# Patient Record
Sex: Male | Born: 1935 | Race: White | Hispanic: No | Smoking: Former smoker
Health system: Southern US, Community
[De-identification: ages and names within clinical notes are randomized; demographics above are authoritative.]

## PROBLEM LIST (undated history)

## (undated) DIAGNOSIS — I200 Unstable angina: Secondary | ICD-10-CM

## (undated) DIAGNOSIS — E785 Hyperlipidemia, unspecified: Secondary | ICD-10-CM

## (undated) DIAGNOSIS — K2970 Gastritis, unspecified, without bleeding: Secondary | ICD-10-CM

## (undated) DIAGNOSIS — M1990 Unspecified osteoarthritis, unspecified site: Secondary | ICD-10-CM

## (undated) DIAGNOSIS — Z8551 Personal history of malignant neoplasm of bladder: Secondary | ICD-10-CM

## (undated) DIAGNOSIS — L409 Psoriasis, unspecified: Secondary | ICD-10-CM

## (undated) DIAGNOSIS — M75110 Incomplete rotator cuff tear or rupture of unspecified shoulder, not specified as traumatic: Secondary | ICD-10-CM

## (undated) DIAGNOSIS — Z87442 Personal history of urinary calculi: Secondary | ICD-10-CM

## (undated) DIAGNOSIS — K219 Gastro-esophageal reflux disease without esophagitis: Secondary | ICD-10-CM

## (undated) DIAGNOSIS — I10 Essential (primary) hypertension: Secondary | ICD-10-CM

## (undated) DIAGNOSIS — I452 Bifascicular block: Secondary | ICD-10-CM

## (undated) DIAGNOSIS — I2510 Atherosclerotic heart disease of native coronary artery without angina pectoris: Secondary | ICD-10-CM

## (undated) HISTORY — PX: NECK SURGERY: SHX720

## (undated) HISTORY — DX: Unstable angina: I20.0

## (undated) HISTORY — DX: Incomplete rotator cuff tear or rupture of unspecified shoulder, not specified as traumatic: M75.110

## (undated) HISTORY — PX: BACK SURGERY: SHX140

## (undated) HISTORY — DX: Bifascicular block: I45.2

## (undated) HISTORY — DX: Essential (primary) hypertension: I10

## (undated) HISTORY — DX: Gastro-esophageal reflux disease without esophagitis: K21.9

## (undated) HISTORY — PX: TONSILLECTOMY: SUR1361

## (undated) HISTORY — DX: Gastritis, unspecified, without bleeding: K29.70

## (undated) HISTORY — PX: SKIN CANCER EXCISION: SHX779

## (undated) HISTORY — DX: Personal history of urinary calculi: Z87.442

## (undated) HISTORY — PX: CYSTOSTOMY W/ BLADDER BIOPSY: SHX1431

## (undated) HISTORY — DX: Hyperlipidemia, unspecified: E78.5

## (undated) HISTORY — DX: Atherosclerotic heart disease of native coronary artery without angina pectoris: I25.10

---

## 1999-07-07 HISTORY — PX: CORONARY ARTERY BYPASS GRAFT: SHX141

## 2000-01-04 HISTORY — PX: CARDIAC CATHETERIZATION: SHX172

## 2000-01-04 HISTORY — PX: PTCA: SHX146

## 2000-01-16 ENCOUNTER — Inpatient Hospital Stay (HOSPITAL_COMMUNITY): Admission: EM | Admit: 2000-01-16 | Discharge: 2000-01-18 | Payer: Self-pay | Admitting: Emergency Medicine

## 2000-01-16 ENCOUNTER — Encounter: Payer: Self-pay | Admitting: Emergency Medicine

## 2000-03-06 HISTORY — PX: OTHER SURGICAL HISTORY: SHX169

## 2000-03-20 ENCOUNTER — Encounter: Payer: Self-pay | Admitting: Emergency Medicine

## 2000-03-20 ENCOUNTER — Inpatient Hospital Stay (HOSPITAL_COMMUNITY): Admission: EM | Admit: 2000-03-20 | Discharge: 2000-03-28 | Payer: Self-pay | Admitting: Emergency Medicine

## 2000-03-23 ENCOUNTER — Encounter: Payer: Self-pay | Admitting: Cardiothoracic Surgery

## 2000-03-24 ENCOUNTER — Encounter: Payer: Self-pay | Admitting: Cardiothoracic Surgery

## 2000-03-25 ENCOUNTER — Encounter: Payer: Self-pay | Admitting: Cardiothoracic Surgery

## 2000-03-26 ENCOUNTER — Encounter: Payer: Self-pay | Admitting: Cardiothoracic Surgery

## 2001-09-03 HISTORY — PX: CT ABD W & PELVIS WO CM: HXRAD294

## 2001-09-11 ENCOUNTER — Encounter: Payer: Self-pay | Admitting: Emergency Medicine

## 2001-09-11 ENCOUNTER — Emergency Department (HOSPITAL_COMMUNITY): Admission: EM | Admit: 2001-09-11 | Discharge: 2001-09-11 | Payer: Self-pay | Admitting: Emergency Medicine

## 2001-09-26 ENCOUNTER — Ambulatory Visit (HOSPITAL_COMMUNITY): Admission: RE | Admit: 2001-09-26 | Discharge: 2001-09-26 | Payer: Self-pay | Admitting: Cardiology

## 2001-09-26 ENCOUNTER — Encounter: Payer: Self-pay | Admitting: Cardiology

## 2001-10-12 ENCOUNTER — Encounter: Payer: Self-pay | Admitting: Otolaryngology

## 2001-10-12 ENCOUNTER — Encounter: Admission: RE | Admit: 2001-10-12 | Discharge: 2001-10-12 | Payer: Self-pay | Admitting: Otolaryngology

## 2002-04-05 HISTORY — PX: NASAL SINUS SURGERY: SHX719

## 2002-04-10 ENCOUNTER — Encounter: Admission: RE | Admit: 2002-04-10 | Discharge: 2002-04-10 | Payer: Self-pay | Admitting: Otolaryngology

## 2002-04-10 ENCOUNTER — Encounter: Payer: Self-pay | Admitting: Otolaryngology

## 2002-04-11 ENCOUNTER — Ambulatory Visit (HOSPITAL_BASED_OUTPATIENT_CLINIC_OR_DEPARTMENT_OTHER): Admission: RE | Admit: 2002-04-11 | Discharge: 2002-04-11 | Payer: Self-pay | Admitting: Otolaryngology

## 2002-04-11 ENCOUNTER — Encounter (INDEPENDENT_AMBULATORY_CARE_PROVIDER_SITE_OTHER): Payer: Self-pay | Admitting: *Deleted

## 2003-10-03 ENCOUNTER — Encounter: Admission: RE | Admit: 2003-10-03 | Discharge: 2003-10-03 | Payer: Self-pay | Admitting: Family Medicine

## 2003-10-05 HISTORY — PX: OTHER SURGICAL HISTORY: SHX169

## 2003-11-05 ENCOUNTER — Encounter: Admission: RE | Admit: 2003-11-05 | Discharge: 2003-11-05 | Payer: Self-pay | Admitting: Cardiology

## 2004-09-18 ENCOUNTER — Ambulatory Visit: Payer: Self-pay | Admitting: Family Medicine

## 2004-10-02 ENCOUNTER — Ambulatory Visit: Payer: Self-pay | Admitting: Family Medicine

## 2004-11-13 ENCOUNTER — Ambulatory Visit: Payer: Self-pay | Admitting: Family Medicine

## 2004-11-19 ENCOUNTER — Ambulatory Visit: Payer: Self-pay | Admitting: Cardiology

## 2005-04-15 ENCOUNTER — Ambulatory Visit: Payer: Self-pay | Admitting: Family Medicine

## 2005-04-22 ENCOUNTER — Ambulatory Visit: Payer: Self-pay | Admitting: Family Medicine

## 2005-09-29 ENCOUNTER — Ambulatory Visit: Payer: Self-pay | Admitting: Family Medicine

## 2005-11-09 ENCOUNTER — Ambulatory Visit: Payer: Self-pay | Admitting: Cardiology

## 2005-12-02 ENCOUNTER — Ambulatory Visit: Payer: Self-pay | Admitting: Family Medicine

## 2005-12-18 ENCOUNTER — Ambulatory Visit: Payer: Self-pay | Admitting: Family Medicine

## 2006-04-06 ENCOUNTER — Ambulatory Visit: Payer: Self-pay | Admitting: Family Medicine

## 2006-09-30 ENCOUNTER — Ambulatory Visit: Payer: Self-pay | Admitting: Family Medicine

## 2006-09-30 LAB — CONVERTED CEMR LAB
BUN: 16 mg/dL (ref 6–23)
Basophils Relative: 0.3 % (ref 0.0–1.0)
CO2: 30 meq/L (ref 19–32)
Cholesterol: 148 mg/dL (ref 0–200)
Creatinine, Ser: 0.9 mg/dL (ref 0.4–1.5)
Eosinophils Relative: 4.2 % (ref 0.0–5.0)
GFR calc Af Amer: 108 mL/min
LDL Cholesterol: 91 mg/dL (ref 0–99)
Monocytes Absolute: 0.6 10*3/uL (ref 0.2–0.7)
Monocytes Relative: 9.7 % (ref 3.0–11.0)
Platelets: 198 10*3/uL (ref 150–400)
Potassium: 3.8 meq/L (ref 3.5–5.1)
RBC: 5.19 M/uL (ref 4.22–5.81)
Sodium: 140 meq/L (ref 135–145)
TSH: 1.05 microintl units/mL (ref 0.35–5.50)
Total CHOL/HDL Ratio: 3.7
Triglycerides: 83 mg/dL (ref 0–149)

## 2006-11-09 ENCOUNTER — Ambulatory Visit: Payer: Self-pay | Admitting: Cardiology

## 2007-04-13 DIAGNOSIS — L570 Actinic keratosis: Secondary | ICD-10-CM | POA: Insufficient documentation

## 2007-04-13 DIAGNOSIS — J310 Chronic rhinitis: Secondary | ICD-10-CM | POA: Insufficient documentation

## 2007-04-13 DIAGNOSIS — H9190 Unspecified hearing loss, unspecified ear: Secondary | ICD-10-CM | POA: Insufficient documentation

## 2007-04-13 DIAGNOSIS — Z87442 Personal history of urinary calculi: Secondary | ICD-10-CM | POA: Insufficient documentation

## 2007-04-13 DIAGNOSIS — K219 Gastro-esophageal reflux disease without esophagitis: Secondary | ICD-10-CM | POA: Insufficient documentation

## 2007-04-13 DIAGNOSIS — E785 Hyperlipidemia, unspecified: Secondary | ICD-10-CM | POA: Insufficient documentation

## 2007-04-14 ENCOUNTER — Ambulatory Visit: Payer: Self-pay | Admitting: Family Medicine

## 2007-04-15 ENCOUNTER — Telehealth: Payer: Self-pay | Admitting: Family Medicine

## 2007-04-15 LAB — CONVERTED CEMR LAB
ALT: 24 units/L (ref 0–53)
HDL: 36.4 mg/dL — ABNORMAL LOW (ref >39.0)
LDL Cholesterol: 86 mg/dL (ref 0–99)
VLDL: 28 mg/dL (ref 0–40)

## 2007-04-18 ENCOUNTER — Encounter: Payer: Self-pay | Admitting: Family Medicine

## 2007-10-03 ENCOUNTER — Ambulatory Visit: Payer: Self-pay | Admitting: Family Medicine

## 2007-10-03 DIAGNOSIS — I2510 Atherosclerotic heart disease of native coronary artery without angina pectoris: Secondary | ICD-10-CM

## 2007-10-05 HISTORY — PX: KIDNEY STONE SURGERY: SHX686

## 2007-10-05 LAB — CONVERTED CEMR LAB
Bilirubin, Direct: 0.3 mg/dL (ref 0.0–0.3)
Calcium: 9.6 mg/dL (ref 8.4–10.5)
Cholesterol: 172 mg/dL (ref 0–200)
Creatinine, Ser: 1 mg/dL (ref 0.4–1.5)
GFR calc Af Amer: 95 mL/min
GFR calc non Af Amer: 79 mL/min
Glucose, Bld: 114 mg/dL — ABNORMAL HIGH (ref 70–99)
HDL: 39.9 mg/dL (ref >39.0)
MCHC: 32.9 g/dL (ref 30.0–36.0)
MCV: 94.4 fL (ref 78.0–100.0)
Monocytes Absolute: 0.8 10*3/uL (ref 0.1–1.0)
Monocytes Relative: 9.4 % (ref 3.0–12.0)
Neutrophils Relative %: 57.8 % (ref 43.0–77.0)
RBC: 5.9 M/uL — ABNORMAL HIGH (ref 4.22–5.81)
RDW: 12.1 % (ref 11.5–14.6)
Sodium: 142 meq/L (ref 135–145)
Total Bilirubin: 2.3 mg/dL — ABNORMAL HIGH (ref 0.3–1.2)
Total CHOL/HDL Ratio: 4.3
Triglycerides: 134 mg/dL (ref 0–149)

## 2007-10-14 ENCOUNTER — Emergency Department (HOSPITAL_COMMUNITY): Admission: EM | Admit: 2007-10-14 | Discharge: 2007-10-14 | Payer: Self-pay | Admitting: Emergency Medicine

## 2007-10-14 ENCOUNTER — Encounter: Payer: Self-pay | Admitting: Family Medicine

## 2007-10-14 HISTORY — PX: CT ABD WO/W & PELVIS WO CM: HXRAD295

## 2007-10-14 HISTORY — PX: OTHER SURGICAL HISTORY: SHX169

## 2007-10-17 ENCOUNTER — Ambulatory Visit: Payer: Self-pay | Admitting: Family Medicine

## 2007-10-17 DIAGNOSIS — N209 Urinary calculus, unspecified: Secondary | ICD-10-CM

## 2007-10-19 ENCOUNTER — Telehealth: Payer: Self-pay | Admitting: Family Medicine

## 2007-10-19 ENCOUNTER — Encounter: Payer: Self-pay | Admitting: Family Medicine

## 2007-10-19 ENCOUNTER — Inpatient Hospital Stay (HOSPITAL_COMMUNITY): Admission: AD | Admit: 2007-10-19 | Discharge: 2007-10-22 | Payer: Self-pay | Admitting: Urology

## 2007-10-22 ENCOUNTER — Encounter: Payer: Self-pay | Admitting: Family Medicine

## 2007-10-26 ENCOUNTER — Encounter: Payer: Self-pay | Admitting: Family Medicine

## 2007-10-31 ENCOUNTER — Ambulatory Visit: Payer: Self-pay | Admitting: Family Medicine

## 2007-11-01 LAB — CONVERTED CEMR LAB
Basophils Relative: 0.6 % (ref 0.0–1.0)
Eosinophils Absolute: 0.3 10*3/uL (ref 0.0–0.7)
Eosinophils Relative: 4.1 % (ref 0.0–5.0)
Hemoglobin: 14.9 g/dL (ref 13.0–17.0)
Lymphocytes Relative: 28.3 % (ref 12.0–46.0)
MCHC: 33.8 g/dL (ref 30.0–36.0)
Monocytes Absolute: 0.8 10*3/uL (ref 0.1–1.0)
Monocytes Relative: 11.2 % (ref 3.0–12.0)
Neutro Abs: 3.8 10*3/uL (ref 1.4–7.7)
Neutrophils Relative %: 55.8 % (ref 43.0–77.0)
Platelets: 365 10*3/uL (ref 150–400)
WBC: 6.8 10*3/uL (ref 4.5–10.5)

## 2007-11-07 ENCOUNTER — Ambulatory Visit: Payer: Self-pay | Admitting: Cardiology

## 2008-04-02 ENCOUNTER — Ambulatory Visit: Payer: Self-pay | Admitting: Family Medicine

## 2008-04-04 ENCOUNTER — Ambulatory Visit: Payer: Self-pay | Admitting: Family Medicine

## 2008-04-05 LAB — CONVERTED CEMR LAB
AST: 21 units/L (ref 0–37)
Albumin: 3.7 g/dL (ref 3.5–5.2)
Basophils Relative: 0.4 % (ref 0.0–3.0)
Bilirubin, Direct: 0.2 mg/dL (ref 0.0–0.3)
CO2: 31 meq/L (ref 19–32)
Calcium: 8.9 mg/dL (ref 8.4–10.5)
Eosinophils Relative: 5.7 % — ABNORMAL HIGH (ref 0.0–5.0)
GFR calc Af Amer: 107 mL/min
GFR calc non Af Amer: 89 mL/min
Glucose, Bld: 99 mg/dL (ref 70–99)
HDL: 35.8 mg/dL — ABNORMAL LOW (ref >39.0)
LDL Cholesterol: 89 mg/dL (ref 0–99)
Lymphocytes Relative: 29.4 % (ref 12.0–46.0)
Monocytes Absolute: 0.9 10*3/uL (ref 0.1–1.0)
Neutrophils Relative %: 52.8 % (ref 43.0–77.0)
Potassium: 3.7 meq/L (ref 3.5–5.1)
RDW: 11.7 % (ref 11.5–14.6)
Sodium: 140 meq/L (ref 135–145)
Total CHOL/HDL Ratio: 4.1
Total Protein: 6.2 g/dL (ref 6.0–8.3)
Triglycerides: 117 mg/dL (ref 0–149)
VLDL: 23 mg/dL (ref 0–40)

## 2008-04-09 ENCOUNTER — Telehealth: Payer: Self-pay | Admitting: Family Medicine

## 2008-07-05 ENCOUNTER — Ambulatory Visit: Payer: Self-pay | Admitting: Family Medicine

## 2008-07-05 DIAGNOSIS — T148XXA Other injury of unspecified body region, initial encounter: Secondary | ICD-10-CM | POA: Insufficient documentation

## 2008-07-10 ENCOUNTER — Ambulatory Visit: Payer: Self-pay | Admitting: Family Medicine

## 2008-07-10 DIAGNOSIS — R1011 Right upper quadrant pain: Secondary | ICD-10-CM

## 2008-07-11 ENCOUNTER — Encounter: Admission: RE | Admit: 2008-07-11 | Discharge: 2008-07-11 | Payer: Self-pay | Admitting: Family Medicine

## 2008-08-02 ENCOUNTER — Ambulatory Visit: Payer: Self-pay | Admitting: Gastroenterology

## 2008-08-02 DIAGNOSIS — R143 Flatulence: Secondary | ICD-10-CM

## 2008-08-02 DIAGNOSIS — R142 Eructation: Secondary | ICD-10-CM

## 2008-08-02 DIAGNOSIS — R141 Gas pain: Secondary | ICD-10-CM

## 2008-08-06 HISTORY — PX: ESOPHAGOGASTRODUODENOSCOPY: SHX1529

## 2008-08-08 ENCOUNTER — Ambulatory Visit: Payer: Self-pay | Admitting: Gastroenterology

## 2008-08-10 ENCOUNTER — Encounter: Payer: Self-pay | Admitting: Gastroenterology

## 2008-11-03 HISTORY — PX: CARDIAC CATHETERIZATION: SHX172

## 2008-11-07 DIAGNOSIS — I25810 Atherosclerosis of coronary artery bypass graft(s) without angina pectoris: Secondary | ICD-10-CM

## 2008-11-08 ENCOUNTER — Ambulatory Visit: Payer: Self-pay | Admitting: Cardiology

## 2008-11-08 LAB — CONVERTED CEMR LAB
Basophils Absolute: 0 10*3/uL (ref 0.0–0.1)
Chloride: 106 meq/L (ref 96–112)
Glucose, Bld: 97 mg/dL (ref 70–99)
INR: 1 (ref 0.8–1.0)
Lymphs Abs: 1.6 10*3/uL (ref 0.7–4.0)
MCV: 94.1 fL (ref 78.0–100.0)
Monocytes Relative: 10.1 % (ref 3.0–12.0)
Neutrophils Relative %: 55.8 % (ref 43.0–77.0)
Platelets: 187 10*3/uL (ref 150.0–400.0)
Potassium: 4 meq/L (ref 3.5–5.1)
Prothrombin Time: 11.2 s (ref 10.9–13.3)
RDW: 12.1 % (ref 11.5–14.6)
Sodium: 140 meq/L (ref 135–145)
WBC: 5.9 10*3/uL (ref 4.5–10.5)
aPTT: 30.8 s — ABNORMAL HIGH (ref 21.7–28.8)

## 2008-11-15 ENCOUNTER — Ambulatory Visit: Payer: Self-pay | Admitting: Cardiology

## 2008-11-15 ENCOUNTER — Inpatient Hospital Stay (HOSPITAL_BASED_OUTPATIENT_CLINIC_OR_DEPARTMENT_OTHER): Admission: RE | Admit: 2008-11-15 | Discharge: 2008-11-15 | Payer: Self-pay | Admitting: Cardiology

## 2008-12-04 ENCOUNTER — Ambulatory Visit: Payer: Self-pay | Admitting: Cardiology

## 2008-12-15 IMAGING — CT CT PELVIS W/O CM
2 of 4 series · 13 of 32 positions shown, 19 images · non-contrast
Comparison: None

CT ABDOMEN

CLINICAL DATA: The right sided abdominal pain, right flank pain

CT ABDOMEN AND PELVIS WITHOUT CONTRAST
TECHNIQUE: Multidetector CT imaging of the abdomen and pelvis was
performed following the standard
protocol without intravenous contrast.

[Series 2: <(id) w/o a/p >(id) · axial · non-contrast · 0.75mm/px · z∈[-499,-124]mm · 9 of 94 slices shown, 15 images]
[im 10/94  soft-tissue]
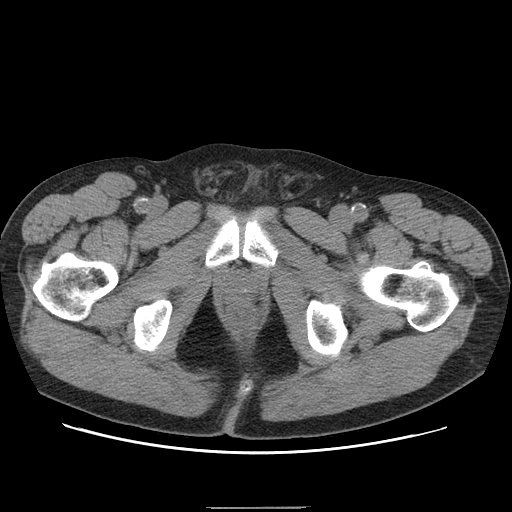
[im 10/94  bone]
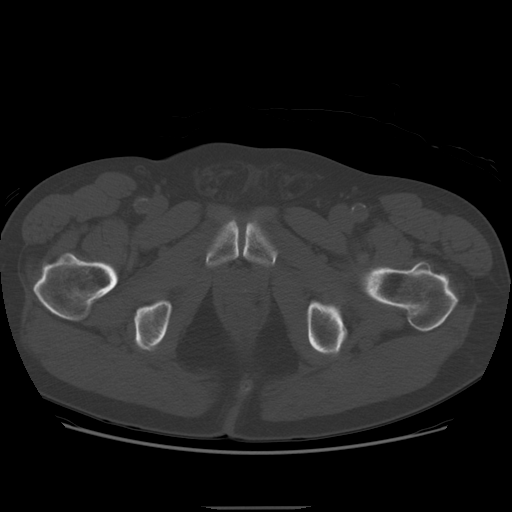
[im 19/94  soft-tissue]
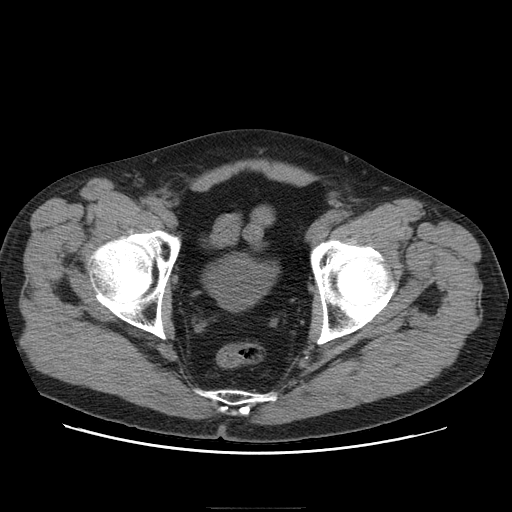
[im 28/94  soft-tissue]
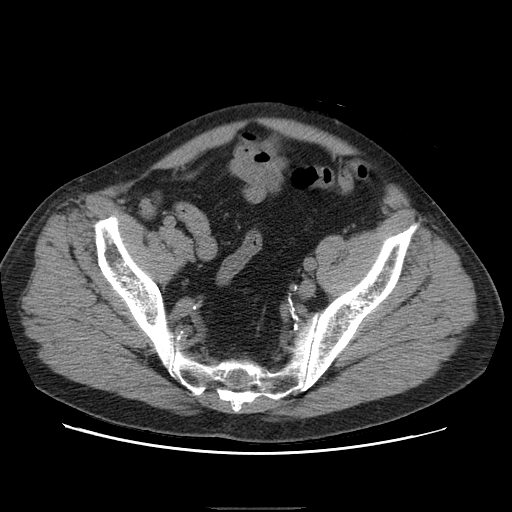
[im 38/94  soft-tissue]
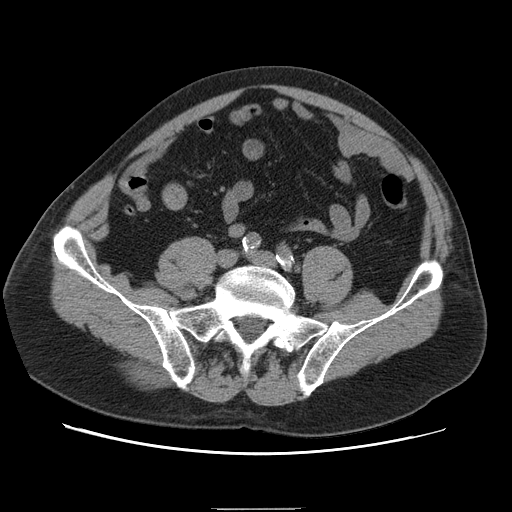
[im 47/94  soft-tissue]
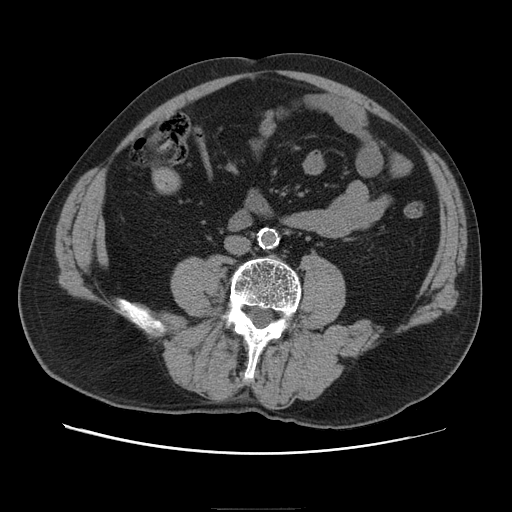
[im 56/94  soft-tissue]
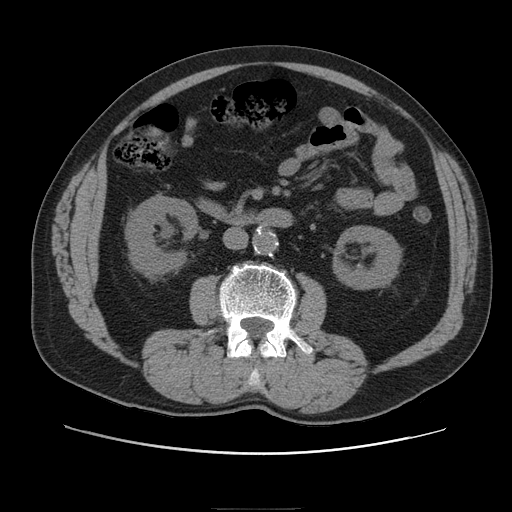
[im 56/94  lung]
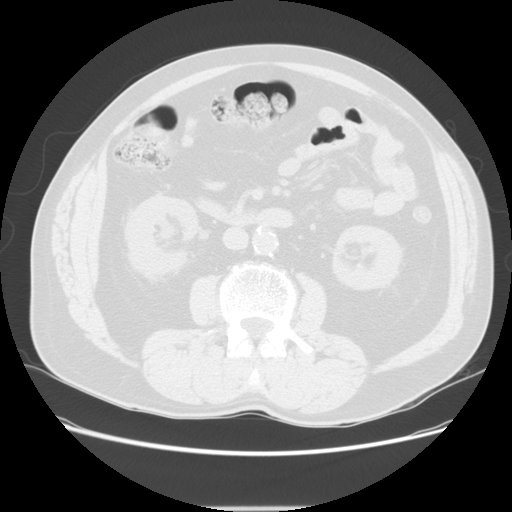
[im 66/94  soft-tissue]
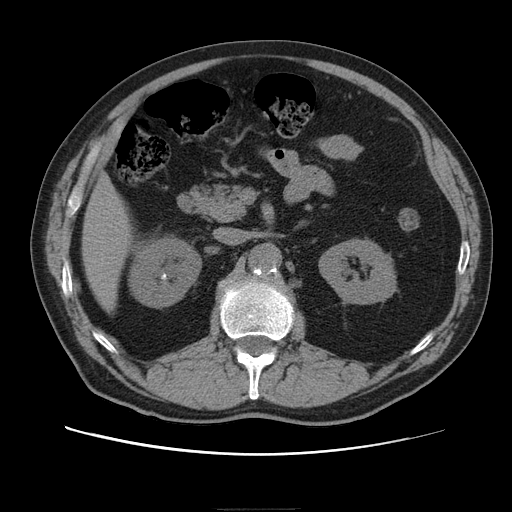
[im 66/94  lung]
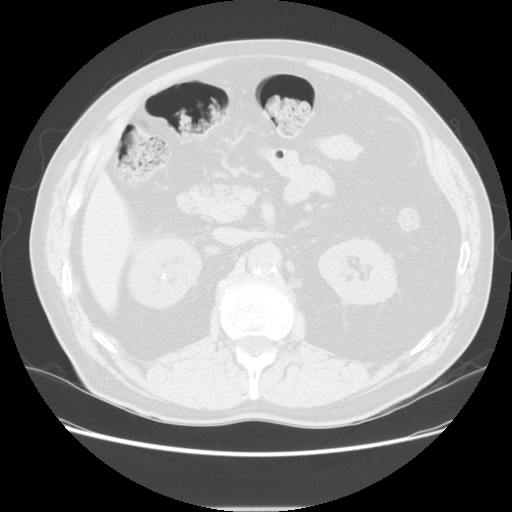
[im 75/94  soft-tissue]
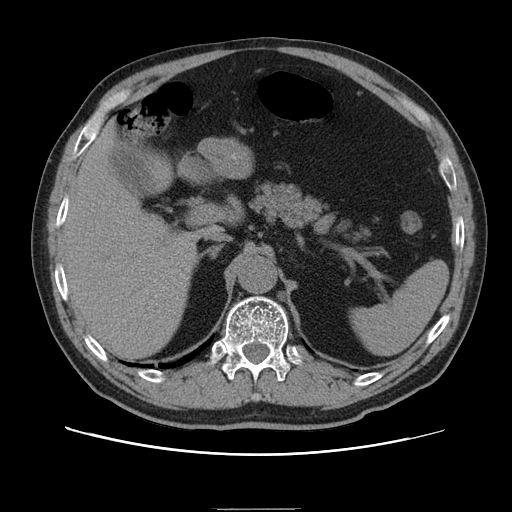
[im 75/94  lung]
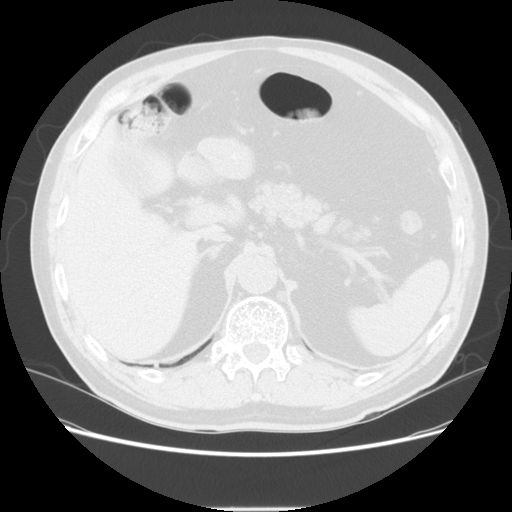
[im 84/94  soft-tissue]
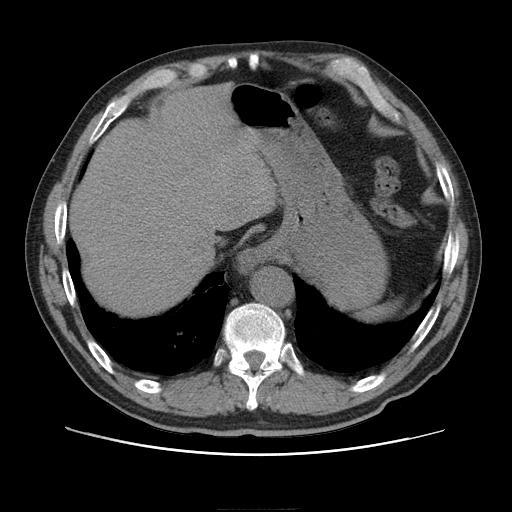
[im 84/94  lung]
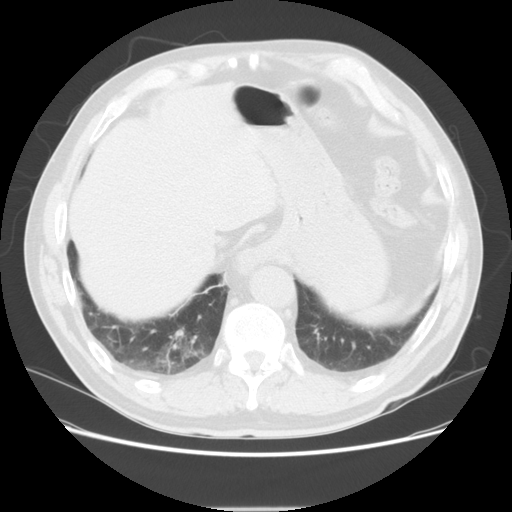
[im 84/94  bone]
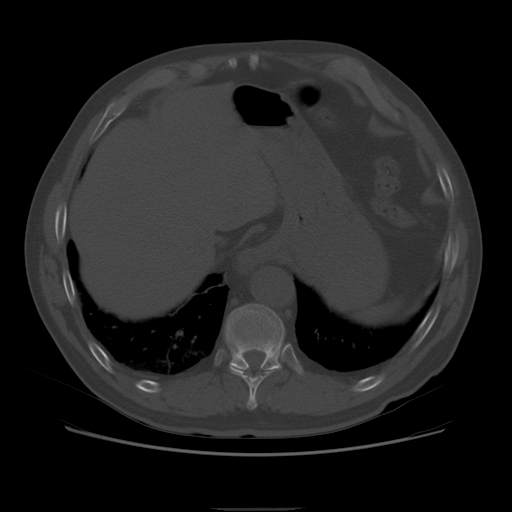

[Series 400: reformatted · sagittal · 0.94mm/px · 4 of 82 slices shown]
[im 10/82  soft-tissue]
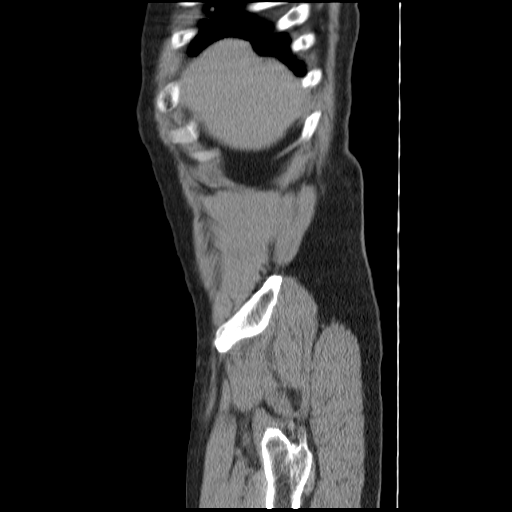
[im 19/82  soft-tissue]
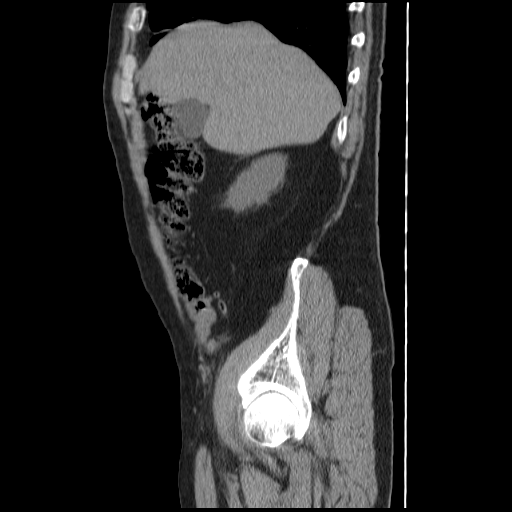
[im 28/82  soft-tissue]
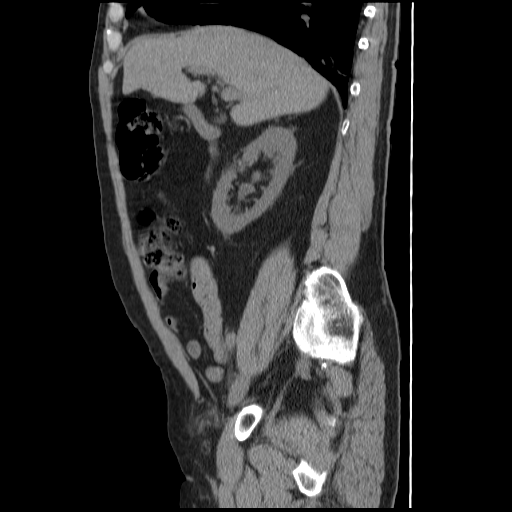
[im 37/82  soft-tissue]
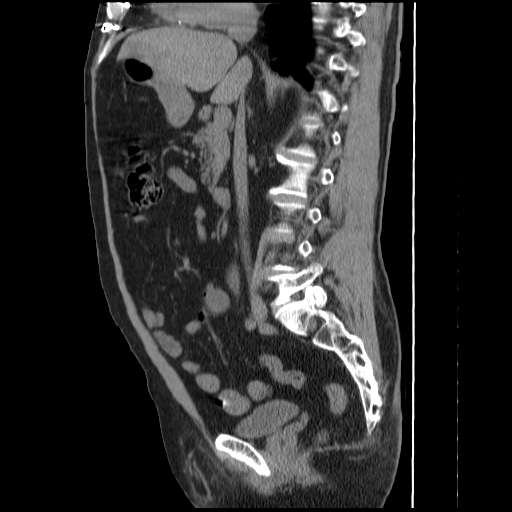

[13 of 32 positions shown; findings below may reference images not displayed]

FINDINGS: There is mild cardiomegaly present.  A small hiatal
hernia is noted.  The liver appears normal in the unenhanced state.
No calcified gallstones are seen.  The pancreas is normal in size
and the pancreatic duct is not dilated.  The adrenal glands and
spleen appear normal.  There are bilateral renal calculi present.
Mild right hydronephrosis is noted and there is a partially
obstructing 4 mm proximal right ureteral calculus present.
Atheromatous changes are noted in the abdominal aorta.
IMPRESSION: 1.  Low grade obstruction on the right by 4 mm proximal right
ureteral calculus.
2.  Small bilateral renal calculi.
3.  Small hiatal hernia.

CT PELVIS
FINDINGS: The distal ureters are normal in caliber.  No distal
ureteral calculi are seen.  The urinary bladder is unremarkable.
The appendix is well seen and appears normal.  No bony abnormality
is noted.
IMPRESSION: Negative CT the pelvis.  No distal ureteral calculi are noted.

## 2008-12-20 IMAGING — CR DG CHEST 2V
2 series · 2 of 2 positions shown · non-contrast
Comparison: PA and lateral chest 10/03/2003.

CLINICAL DATA: Preoperative films for patient with kidney stones.

CHEST - 2 VIEW

[w chest pa]
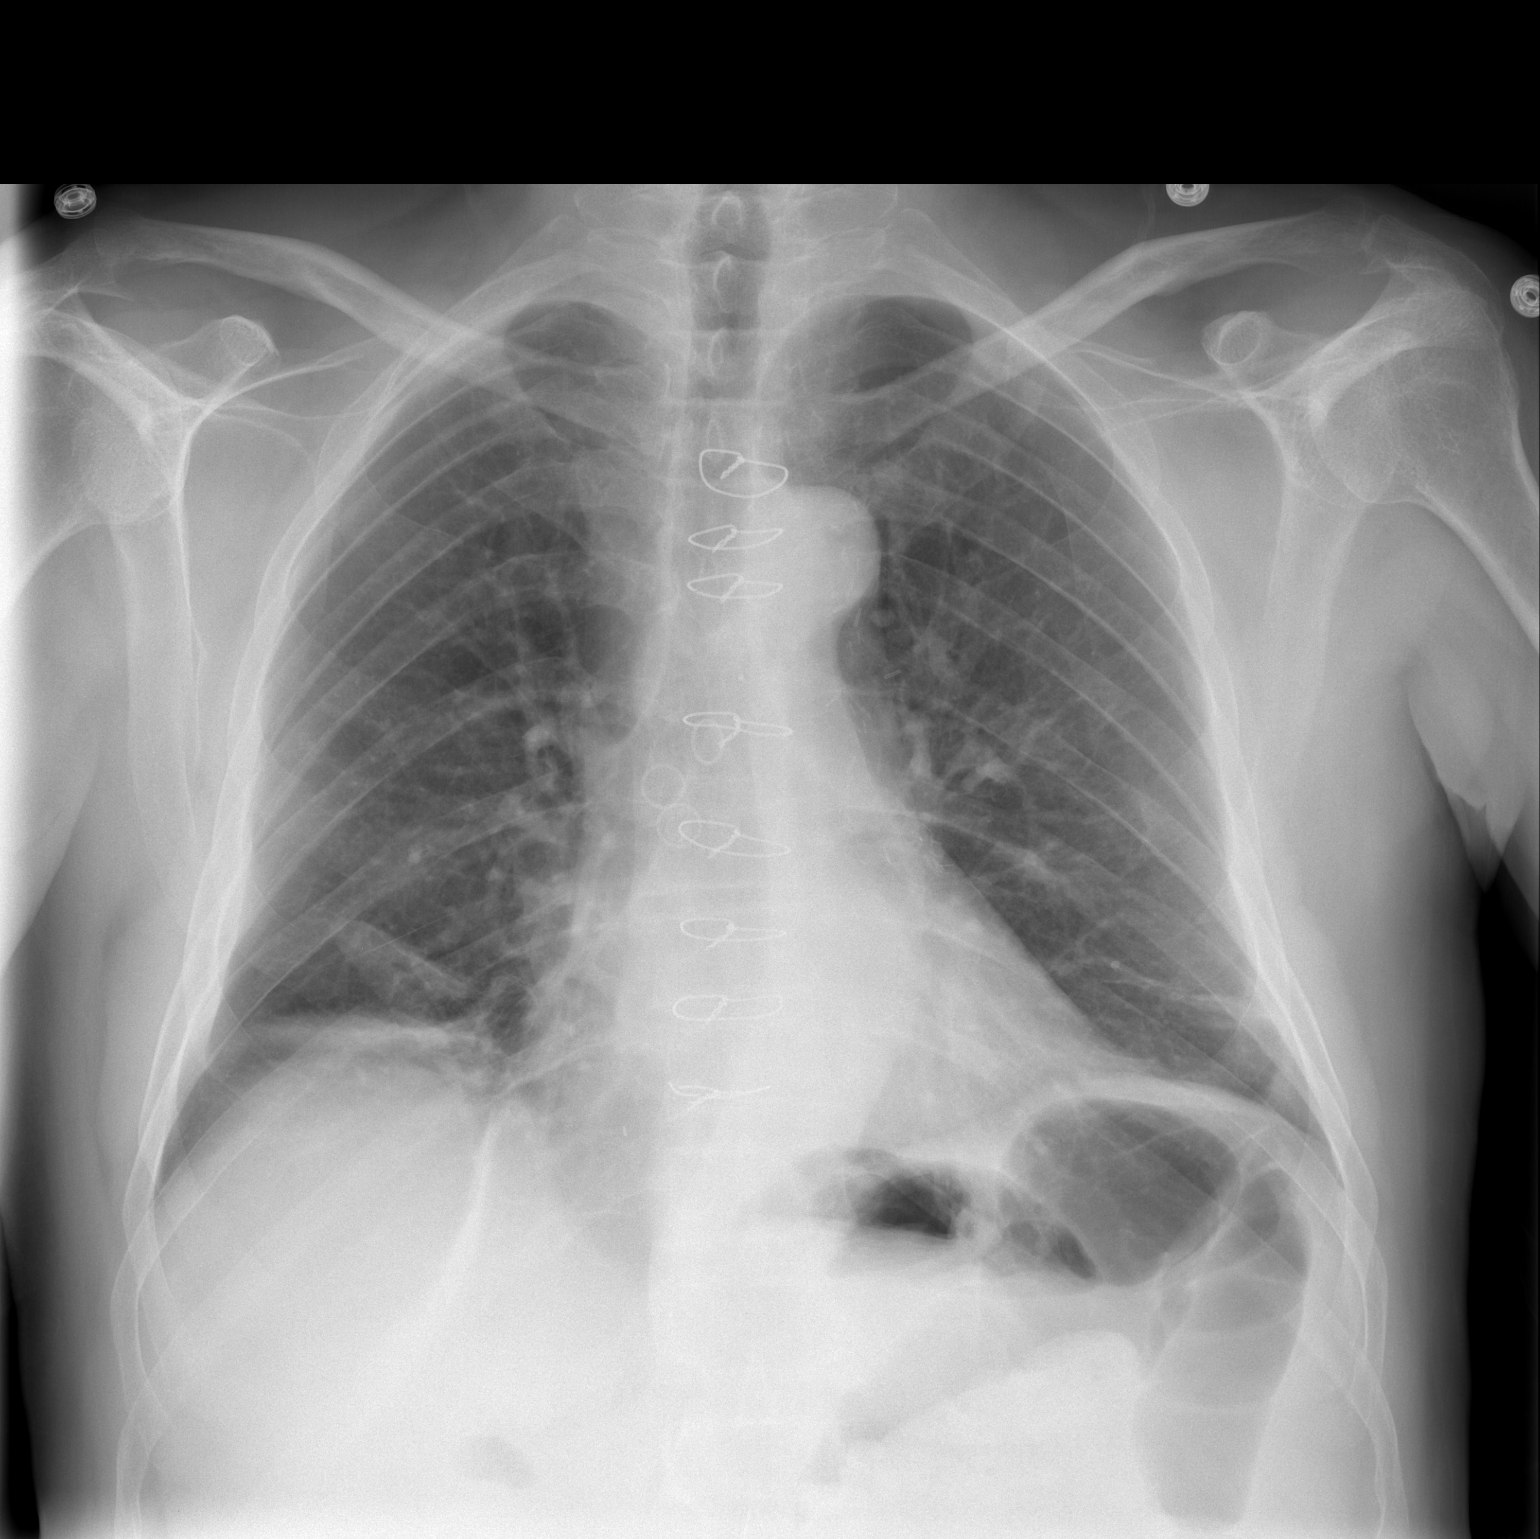

[w chest lat]
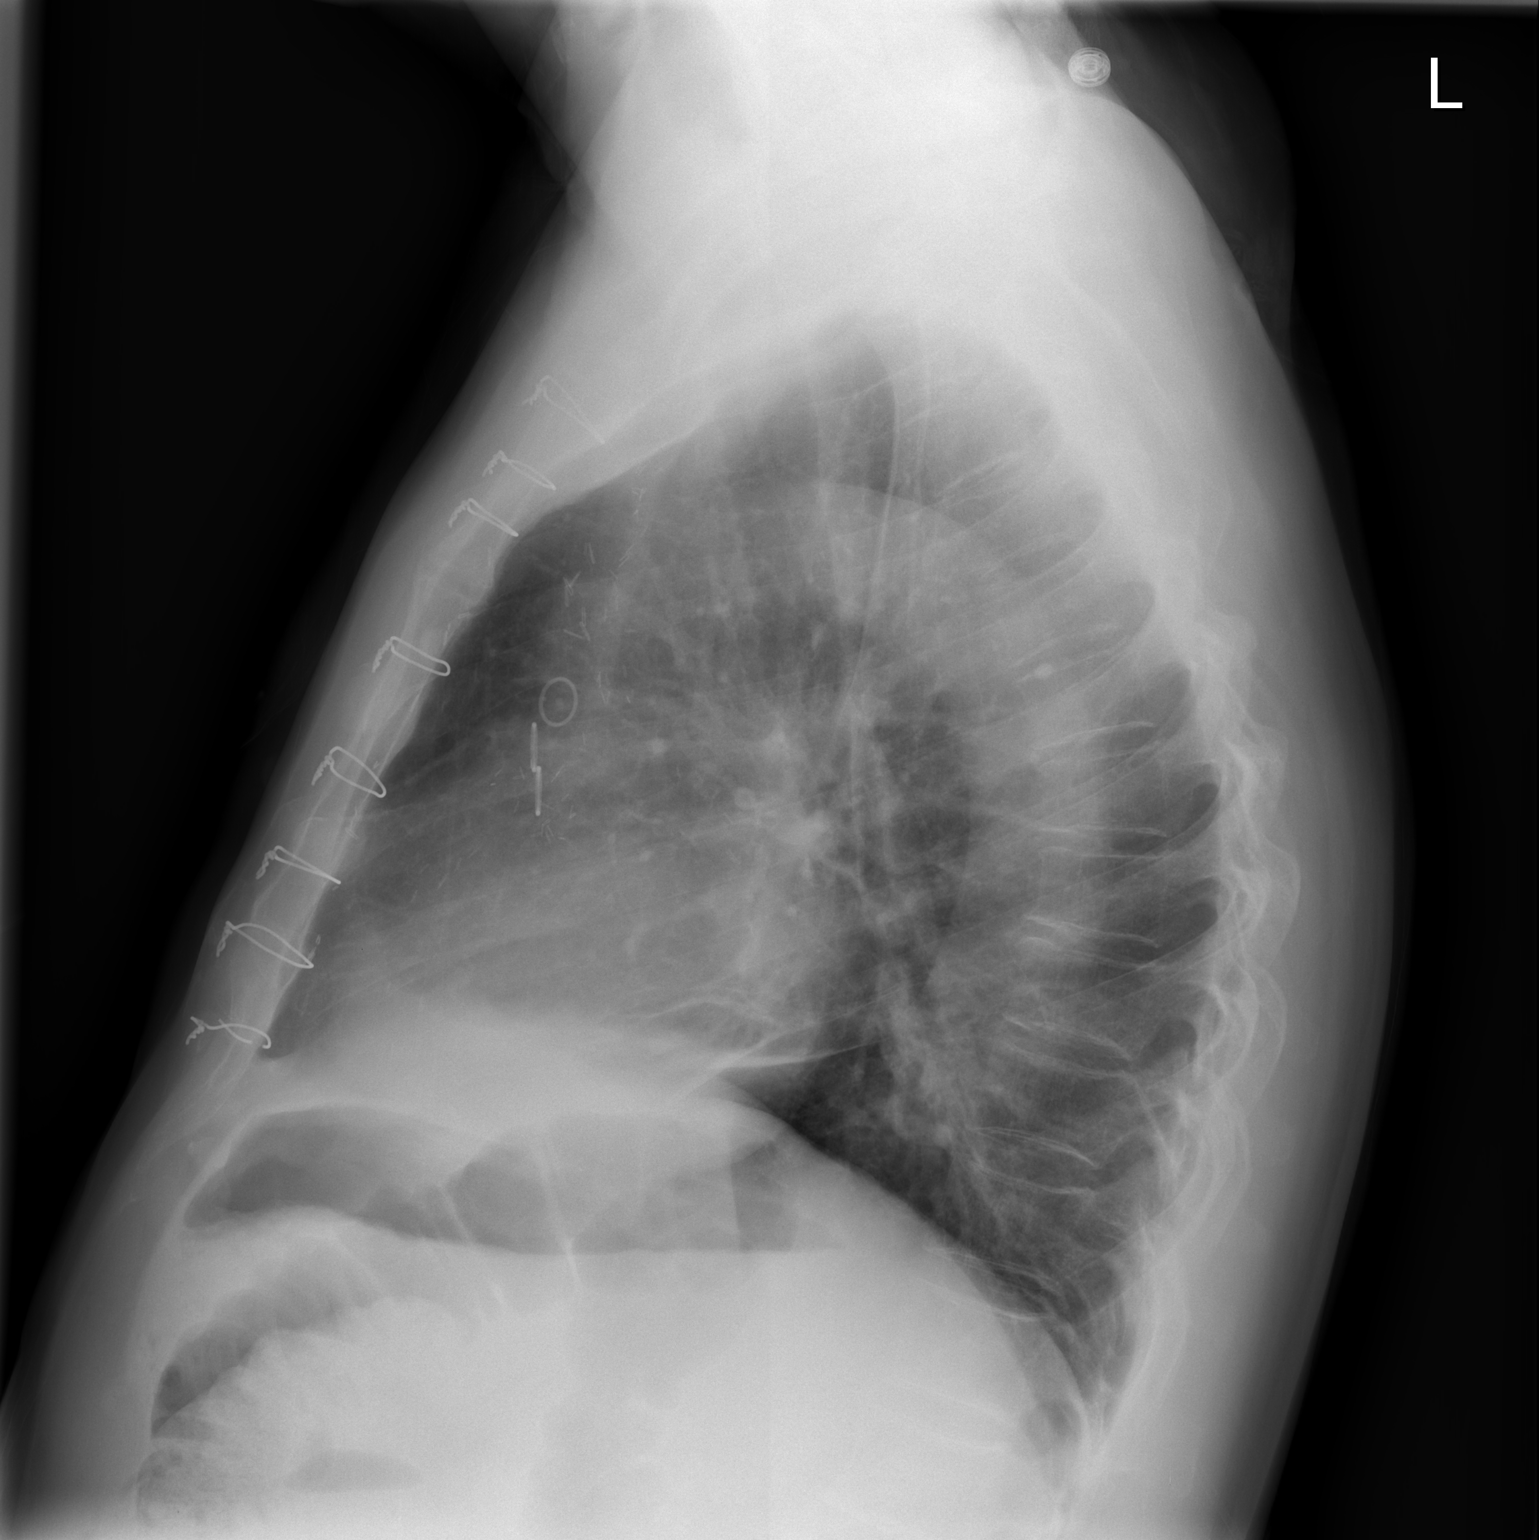

[2 of 2 positions shown; findings below may reference images not displayed]

FINDINGS: Discoid atelectasis in the lung bases, greater on the
right, is noted.  Lungs are otherwise clear.  No pleural effusion.
Heart size normal with postoperative change of CABG noted.
IMPRESSION: Bibasilar atelectasis.  Otherwise, negative.

## 2008-12-21 IMAGING — CR DG ABDOMEN 1V
1 series · 1 of 1 positions shown · non-contrast
Comparison: No plain films, but correlation with CT scan dated
10/14/2007.

CLINICAL DATA: Preop for kidney stone

ABDOMEN - 1 VIEW

[t abdomen supine]
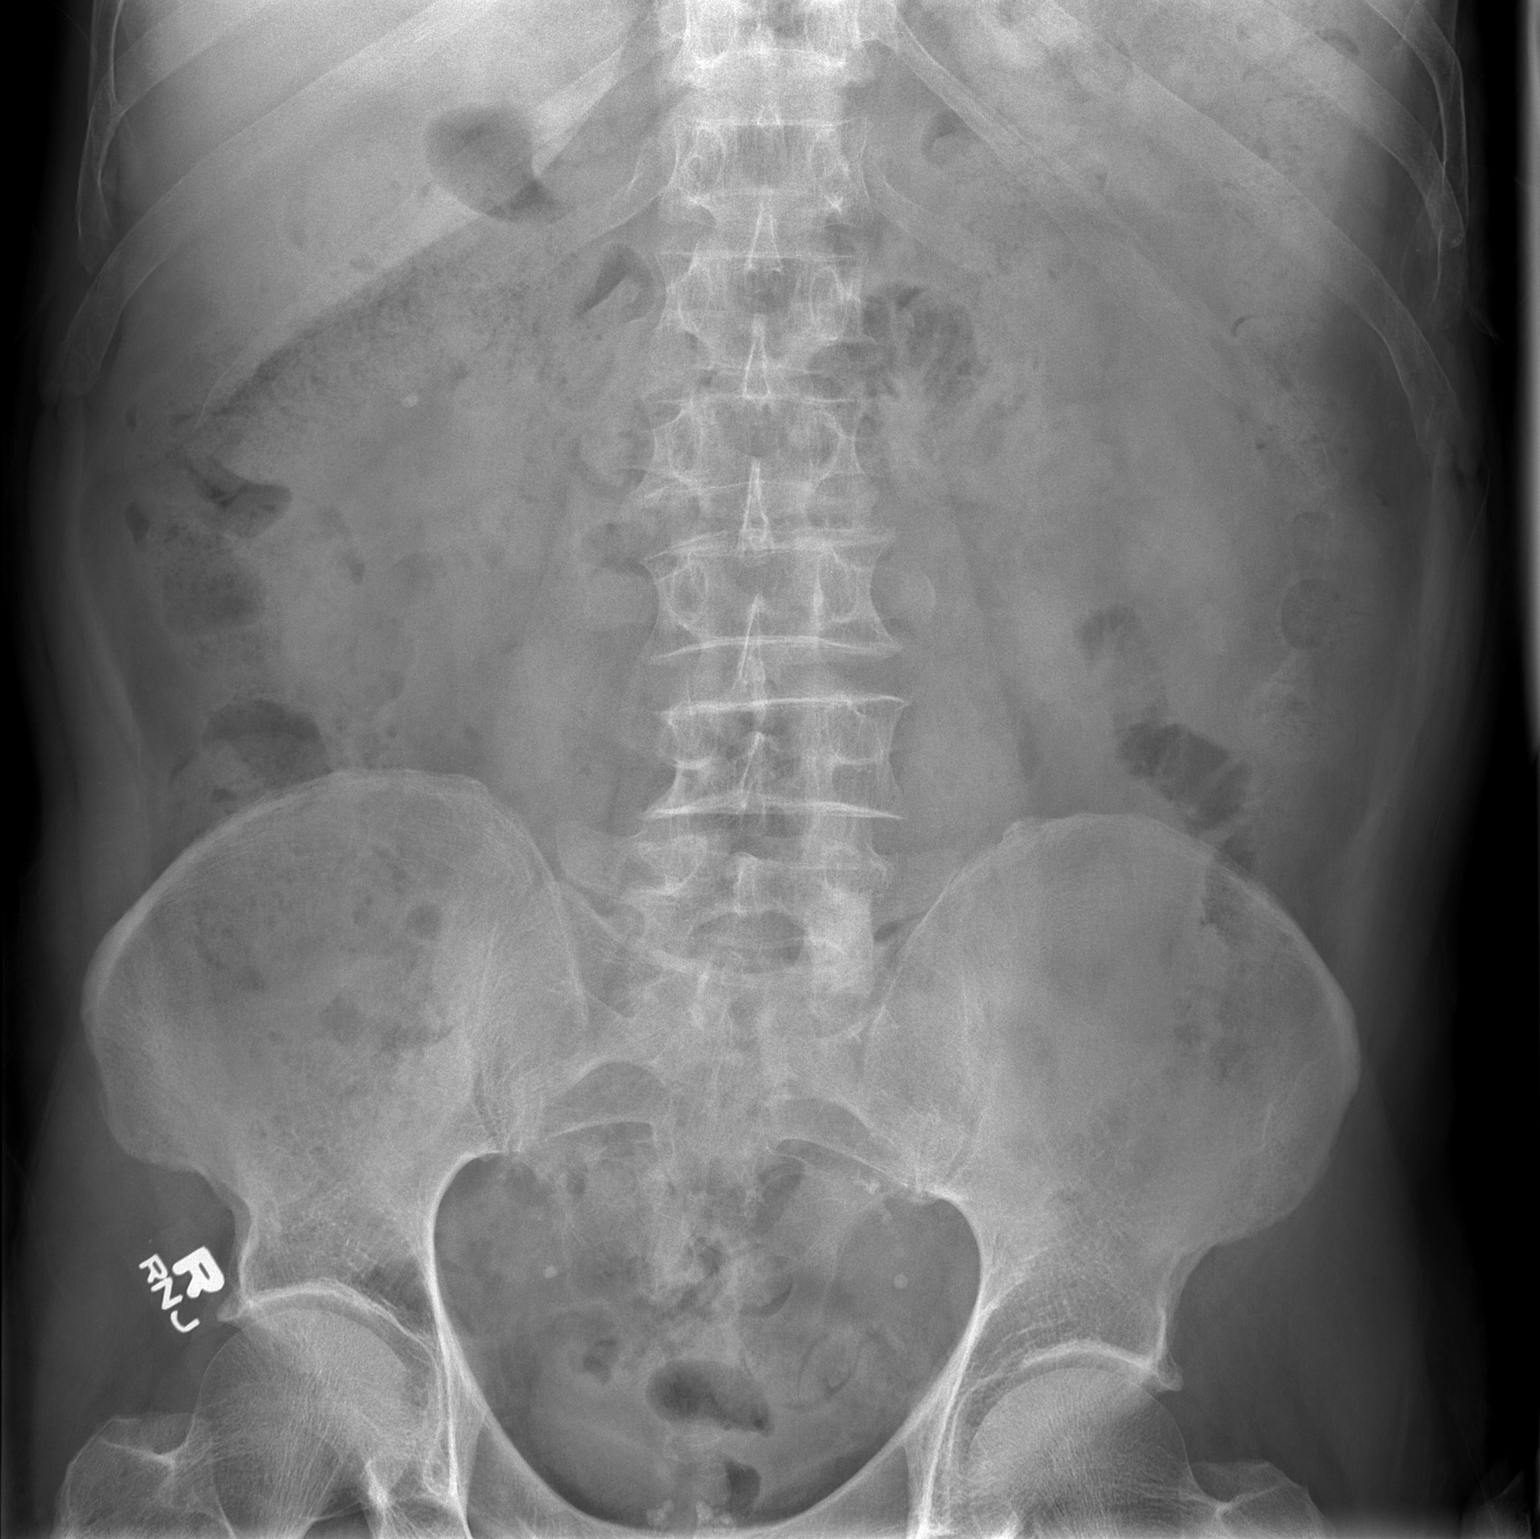

[1 of 1 positions shown; findings below may reference images not displayed]

FINDINGS: There is a 5 mm calculus projecting over the lower pole
of the right kidney.  No definite left renal calculi.  No definite
ureteral stones, although there are multiple calcifications in the
pelvis.  These could be phleboliths.  However, there is a 3.6 mm
calculus projecting in the right true pelvis that may be a distal
ureteral stone.  There was a ureteral calculus hand and right renal
calculus noted on the CT scan dated 10/14/2007.
IMPRESSION: 1.  5 mm right renal calculus.
2.  3.6 mm calculus in the right true pelvis, suspicious for a
distal ureteral stone.  See report

## 2009-01-04 ENCOUNTER — Ambulatory Visit: Payer: Self-pay | Admitting: Family Medicine

## 2009-01-08 LAB — CONVERTED CEMR LAB
Alkaline Phosphatase: 74 units/L (ref 39–117)
Basophils Absolute: 0 10*3/uL (ref 0.0–0.1)
Basophils Relative: 0.1 % (ref 0.0–3.0)
Bilirubin, Direct: 0.2 mg/dL (ref 0.0–0.3)
CO2: 31 meq/L (ref 19–32)
Chloride: 105 meq/L (ref 96–112)
Cholesterol: 129 mg/dL (ref 0–200)
Creatinine, Ser: 1 mg/dL (ref 0.4–1.5)
Eosinophils Absolute: 0.4 10*3/uL (ref 0.0–0.7)
Glucose, Bld: 99 mg/dL (ref 70–99)
HCT: 46.6 % (ref 39.0–52.0)
HDL: 34.8 mg/dL — ABNORMAL LOW (ref >39.00)
LDL Cholesterol: 76 mg/dL (ref 0–99)
Lymphs Abs: 1.7 10*3/uL (ref 0.7–4.0)
MCHC: 34.9 g/dL (ref 30.0–36.0)
MCV: 93.4 fL (ref 78.0–100.0)
Monocytes Absolute: 0.7 10*3/uL (ref 0.1–1.0)
Phosphorus: 3.4 mg/dL (ref 2.3–4.6)
Total Bilirubin: 1.8 mg/dL — ABNORMAL HIGH (ref 0.3–1.2)
Total Protein: 6.2 g/dL (ref 6.0–8.3)
Triglycerides: 89 mg/dL (ref 0.0–149.0)
VLDL: 17.8 mg/dL (ref 0.0–40.0)
WBC: 5.9 10*3/uL (ref 4.5–10.5)

## 2009-01-10 ENCOUNTER — Ambulatory Visit: Payer: Self-pay | Admitting: Family Medicine

## 2009-01-10 ENCOUNTER — Telehealth: Payer: Self-pay | Admitting: Family Medicine

## 2009-04-25 ENCOUNTER — Ambulatory Visit: Payer: Self-pay | Admitting: Family Medicine

## 2009-07-09 ENCOUNTER — Ambulatory Visit: Payer: Self-pay | Admitting: Family Medicine

## 2009-07-10 LAB — CONVERTED CEMR LAB
ALT: 27 units/L (ref 0–53)
Calcium: 8.8 mg/dL (ref 8.4–10.5)
Creatinine, Ser: 1 mg/dL (ref 0.4–1.5)
GFR calc non Af Amer: 78.22 mL/min (ref >60)
HDL: 36.2 mg/dL — ABNORMAL LOW (ref >39.00)
LDL Cholesterol: 87 mg/dL (ref 0–99)
Phosphorus: 2.4 mg/dL (ref 2.3–4.6)
Potassium: 4 meq/L (ref 3.5–5.1)
Sodium: 141 meq/L (ref 135–145)
Triglycerides: 107 mg/dL (ref 0.0–149.0)
VLDL: 21.4 mg/dL (ref 0.0–40.0)

## 2009-07-18 ENCOUNTER — Ambulatory Visit: Payer: Self-pay | Admitting: Family Medicine

## 2009-09-12 ENCOUNTER — Ambulatory Visit: Payer: Self-pay | Admitting: Family Medicine

## 2009-12-12 ENCOUNTER — Ambulatory Visit: Payer: Self-pay | Admitting: Cardiology

## 2010-01-13 ENCOUNTER — Ambulatory Visit: Payer: Self-pay | Admitting: Family Medicine

## 2010-01-13 LAB — CONVERTED CEMR LAB
AST: 20 units/L (ref 0–37)
Cholesterol: 141 mg/dL (ref 0–200)
HDL: 37.7 mg/dL — ABNORMAL LOW (ref >39.00)
Total CHOL/HDL Ratio: 4

## 2010-01-16 ENCOUNTER — Ambulatory Visit: Payer: Self-pay | Admitting: Family Medicine

## 2010-01-16 DIAGNOSIS — M545 Low back pain: Secondary | ICD-10-CM

## 2010-04-08 ENCOUNTER — Telehealth: Payer: Self-pay | Admitting: Family Medicine

## 2010-04-08 ENCOUNTER — Ambulatory Visit: Payer: Self-pay | Admitting: Family Medicine

## 2010-07-15 ENCOUNTER — Ambulatory Visit
Admission: RE | Admit: 2010-07-15 | Discharge: 2010-07-15 | Payer: Self-pay | Source: Home / Self Care | Attending: Family Medicine | Admitting: Family Medicine

## 2010-07-15 ENCOUNTER — Other Ambulatory Visit: Payer: Self-pay | Admitting: Family Medicine

## 2010-07-15 LAB — CBC WITH DIFFERENTIAL/PLATELET
Basophils Absolute: 0 10*3/uL (ref 0.0–0.1)
Basophils Relative: 0.4 % (ref 0.0–3.0)
Eosinophils Absolute: 0.3 10*3/uL (ref 0.0–0.7)
Eosinophils Relative: 4.2 % (ref 0.0–5.0)
HCT: 47.7 % (ref 39.0–52.0)
Hemoglobin: 16.5 g/dL (ref 13.0–17.0)
Lymphocytes Relative: 28.5 % (ref 12.0–46.0)
Lymphs Abs: 2 10*3/uL (ref 0.7–4.0)
MCHC: 34.5 g/dL (ref 30.0–36.0)
MCV: 95.1 fl (ref 78.0–100.0)
Monocytes Absolute: 0.7 10*3/uL (ref 0.1–1.0)
Monocytes Relative: 10.6 % (ref 3.0–12.0)
Neutro Abs: 3.9 10*3/uL (ref 1.4–7.7)
Neutrophils Relative %: 56.3 % (ref 43.0–77.0)
Platelets: 186 10*3/uL (ref 150.0–400.0)
RBC: 5.02 Mil/uL (ref 4.22–5.81)
RDW: 12.7 % (ref 11.5–14.6)
WBC: 6.9 10*3/uL (ref 4.5–10.5)

## 2010-07-15 LAB — RENAL FUNCTION PANEL
Albumin: 3.8 g/dL (ref 3.5–5.2)
BUN: 13 mg/dL (ref 6–23)
CO2: 30 mEq/L (ref 19–32)
Calcium: 8.8 mg/dL (ref 8.4–10.5)
Chloride: 103 mEq/L (ref 96–112)
Creatinine, Ser: 0.8 mg/dL (ref 0.4–1.5)
GFR: 96.71 mL/min (ref >60.00)
Glucose, Bld: 96 mg/dL (ref 70–99)
Phosphorus: 2.7 mg/dL (ref 2.3–4.6)
Potassium: 3.9 mEq/L (ref 3.5–5.1)
Sodium: 142 mEq/L (ref 135–145)

## 2010-07-15 LAB — TSH: TSH: 1.06 u[IU]/mL (ref 0.35–5.50)

## 2010-07-15 LAB — LIPID PANEL
Cholesterol: 137 mg/dL (ref 0–200)
HDL: 39.4 mg/dL (ref >39.00)
LDL Cholesterol: 80 mg/dL (ref 0–99)
Total CHOL/HDL Ratio: 3
Triglycerides: 89 mg/dL (ref 0.0–149.0)
VLDL: 17.8 mg/dL (ref 0.0–40.0)

## 2010-07-15 LAB — AST: AST: 22 U/L (ref 0–37)

## 2010-07-15 LAB — ALT: ALT: 24 U/L (ref 0–53)

## 2010-07-22 ENCOUNTER — Ambulatory Visit
Admission: RE | Admit: 2010-07-22 | Discharge: 2010-07-22 | Payer: Self-pay | Source: Home / Self Care | Attending: Family Medicine | Admitting: Family Medicine

## 2010-07-22 DIAGNOSIS — M542 Cervicalgia: Secondary | ICD-10-CM | POA: Insufficient documentation

## 2010-08-05 NOTE — Assessment & Plan Note (Signed)
Summary: TICK BITE/MK   Vital Signs:  Patient profile:   75 year old male Height:      72 inches Weight:      191.75 pounds BMI:     26.10 Temp:     99.1 degrees F oral Pulse rate:   68 / minute Pulse rhythm:   regular BP sitting:   144 / 80  (left arm) Cuff size:   regular  Vitals Entered By: Lewanda Rife LPN (September 12, 2009 12:50 PM)  History of Present Illness: noticed tick bite on his back on 2/28 had come in from hunting - picked off a lot of them several days later  this one got lodged in further   was able to pull it off that night and clean it up  was a fairly large tick with a white dot on it   next day had some redness about the size of a quarter  was itchy so he scratched it  used a band aid with abx oint that day - and looked better the next day   daughter saw yesterday and wanted him to come in   feels fine- no fever or joint pain or rash or other symptoms   is around a lot of dogs     Allergies (verified): No Known Drug Allergies  Past History:  Past Medical History: Last updated: 11/17/2008 Cardiac cath- multi vessel PTCA (01/2000)- CAD GERD/gastritis Hyperlipidemia Nephrolithiasis, hx of partial rotator cuff tear R Hypertension  1. Coronary artery disease status post coronary artery bypass graft     surgery in 2001 with patent grafts in 2003 except for occlusion of     the distal limb of a posterolateral branch. 2. Hypertension 3. Hyperlipidemia 4. Bifascicular block.     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) Unstable angina- diffuse (03/2000) Coronary artery bypass graft 2001 Back surgery Cath  CT abd and pelvis- kidney stones (09/2001) Sinus surgery (04/2002) Stress cardiolite- no acute changes (10/2003) Neck surgery in past Abd CT Low Grade Obstruction on R by 4mm prox ereteral calculus, Sm bilat renal calculi sm HH ( 10/14/2007) Pelvic CT nml (10/14/2007) hospital  4/09 for surgical removal R ureteral kidnsy stone  2/10 EGD- mild gastritis and GERD 5/10 cardiac cath- multi vessel dz, re occlusion small vessel  Family History: Last updated: 04-15-07 Father: deceased - ETOH, DM, pacemaker Mother: deceased age 22- TIA's, CAD Siblings: 1 half brother, 3 half sisters  Social History: Last updated: 2008-11-10 Marital Status: Wife died last year of lung cancer Children: 2 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 Eyes:  Denies blurring and eye irritation. ENT:  Denies sore throat. CV:  Denies chest pain or discomfort and palpitations. Resp:  Denies cough and wheezing. GI:  Denies abdominal pain, diarrhea, nausea, and vomiting. MS:  Denies joint pain, joint redness, joint swelling, and muscle aches. Derm:  Complains of lesion(s); denies rash. Neuro:  Denies headaches, numbness, and tingling. Heme:  Denies abnormal bruising and bleeding.  Physical Exam  General:  Well-developed,well-nourished,in no acute distress; alert,appropriate and cooperative throughout examination Head:  normocephalic, atraumatic, and no abnormalities observed.   Eyes:  vision grossly intact, pupils equal, pupils round, and pupils reactive to light.   Ears:  HOH  Nose:  nares boggy Mouth:  pharynx pink and moist.   Neck:  No deformities, masses, or tenderness noted. Lungs:  CTA with diffusely distant bs no crackles or wheeze  Heart:  Regular rate and rhythm; no murmurs, rubs,  or bruits. Msk:  No deformity or scoliosis noted of thoracic or lumbar spine.  no acute joint changes Neurologic:  gait normal and DTRs symmetrical and normal.   Skin:  tiny scab without redness or swelling or residual tick parts on L low back  no other lesions or changes  Cervical Nodes:   No lymphadenopathy noted Psych:  normal affect, talkative and pleasant    Impression & Recommendations:  Problem # 1:  TICK BITE (ICD-E906.4) Assessment New healed and also resolved without complication adv abx oint for several more days update if red or rash or fever or target shape or other change  rev s/s of tick fever to watch for  rev measures to prevent tick bites   Complete Medication List: 1)  Lipitor 40 Mg Tabs (Atorvastatin calcium) .... Take 1 tablet by mouth once a day 2)  Hydrochlorothiazide 50 Mg Tabs (Hydrochlorothiazide) .... Take 1 tablet by mouth once a day 3)  Nitrolingual 0.4 Mg/spray Soln (Nitroglycerin) .... Spray 1-2 spray on tongue 4)  Aspirin Ec 325 Mg Tbec (Aspirin) .... One by mouth daily 5)  Prilosec Otc 20 Mg Tbec (Omeprazole magnesium) .... Take one by mouth daily as needed 6)  Multivitamins Tabs (Multiple vitamin) .Marland Kitchen.. 1 daily by mouth 7)  Oxycodone-acetaminophen 5-325 Mg Tabs (Oxycodone-acetaminophen) .... Take one tablet every 4 hours as needed pain 8)  Vicodin 5-500 Mg Tabs (Hydrocodone-acetaminophen) .Marland Kitchen.. 1 by mouth q 6 hours as needed pain 9)  Benadryl Allergy 25 Mg Tabs (Diphenhydramine hcl) .... One by mouth as needed 10)  Tylenol Pm Extra Strength 500-25 Mg Tabs (Diphenhydramine-apap (sleep)) .... Otc as directed.  Patient Instructions: 1)  keep tick bite clean and dry 2)  use the antibiotic with band aid for  2 more days  3)  if it gets red or swollen or if it looks like a target shape let me know  4)  if you get fever or new joint pain or other symptoms - please let me know  Current Allergies (reviewed today): No known allergies

## 2010-08-05 NOTE — Assessment & Plan Note (Signed)
Summary: FOLLOW-UP/DS   Vital Signs:  Patient profile:   75 year old male Weight:      193 pounds BMI:     26.27 Temp:     98.9 degrees F oral Pulse rate:   80 / minute Pulse rhythm:   regular BP sitting:   140 / 88  (left arm) Cuff size:   regular  Vitals Entered By: Lowella Petties CMA (July 18, 2009 8:42 AM)  Serial Vital Signs/Assessments:  Time      Position  BP       Pulse  Resp  Temp     By                     130/82                         Judith Part MD  CC: follow-up visit   History of Present Illness: here for f/u of HTN and lipids no big changes   some changes with gettng older - has more vivid dreams  this bothers him -- even dreams when he is just waking  does not think he has sleep apnea - in fact sleeps well  still trouble with his L shoulder - needs to f/u with orthopedics  decided not to take the levsin -- still gets occ cramps  his GI symptoms are the same   wt is up 8 lb this visit  his wt varies at home -- ? gets heavier in wintertime  not as much exercise with weather- but getting back to hunting    bp 140/88 today-- fairly stable from last time  lipids up slightly with trig 107, HDL 36 and LDL 87 (this is up from 76) eats more fat in diet this time of year because he travels more hunting   has regular cardiol f/u for CAD  last Td was 3 y ago on urgent care   knows he needs some skin spots treated - esp spot on temple he refuses to return to Dr Terri Piedra-- he disliked him  wants to see diff derm a friend sees- will call back with name  declines treatment today  Allergies: No Known Drug Allergies  Past History:  Past Medical History: Last updated: 11/17/2008 Cardiac cath- multi vessel PTCA (01/2000)- CAD GERD/gastritis Hyperlipidemia Nephrolithiasis, hx of partial rotator cuff tear R Hypertension  1. Coronary artery disease status post coronary artery bypass graft     surgery in 2001 with patent grafts in 2003 except for  occlusion of     the distal limb of a posterolateral branch. 2. Hypertension 3. Hyperlipidemia 4. Bifascicular block.     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) Unstable angina- diffuse (03/2000) Coronary artery bypass graft 2001 Back surgery Cath  CT abd and pelvis- kidney stones (09/2001) Sinus surgery (04/2002) Stress cardiolite- no acute changes (10/2003) Neck surgery in past Abd CT Low Grade Obstruction on R by 4mm prox ereteral calculus, Sm bilat renal calculi sm HH ( 10/14/2007) Pelvic CT nml (10/14/2007) hospital 4/09 for surgical removal R ureteral kidnsy stone  2/10 EGD- mild gastritis and GERD 5/10 cardiac cath- multi vessel dz, re occlusion small vessel  Family History: Last updated: 2007/05/10 Father: deceased - ETOH, DM, pacemaker Mother: deceased age 70- TIA's, CAD Siblings: 1 half brother, 3 half sisters  Social History: Last updated: 12/05/2008 Marital Status: Wife died last year of lung cancer Children:  2 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 Eyes:  Denies blurring and eye irritation. ENT:  Complains of decreased hearing. CV:  Denies chest pain or discomfort, palpitations, shortness of breath with exertion, and swelling of feet. Resp:  Denies cough, shortness of breath, and wheezing. GI:  Denies abdominal pain, change in bowel habits, and constipation. MS:  Complains of joint pain and stiffness; denies joint redness, joint swelling, and muscle aches. Derm:  Complains of lesion(s); denies poor wound healing and rash. Neuro:  Denies headaches, numbness, and tingling. Endo:  Denies cold intolerance, excessive thirst, excessive urination, and heat intolerance.  Physical Exam  General:   Well-developed,well-nourished,in no acute distress; alert,appropriate and cooperative throughout examination Head:  normocephalic, atraumatic, and no abnormalities observed.   Eyes:  vision grossly intact, pupils equal, pupils round, and pupils reactive to light.   Ears:  hard of hearing  Mouth:  pharynx pink and moist.   Neck:  supple with full rom and no masses or thyromegally, no JVD or carotid bruit  Chest Wall:  No deformities, masses, tenderness or gynecomastia noted. Lungs:  CTA with diffusely distant bs no crackles or wheeze  Heart:  Regular rate and rhythm; no murmurs, rubs,  or bruits. Abdomen:  Bowel sounds positive,abdomen soft and non-tender without masses, organomegaly or hernias noted. Msk:  poor rom L shoulder  no other acute joint changes  Pulses:  R and L carotid,radial,femoral,dorsalis pedis and posterior tibial pulses are full and equal bilaterally Extremities:  No clubbing, cyanosis, edema, or deformity noted with normal full range of motion of all joints.   Neurologic:  sensation intact to light touch, gait normal, and DTRs symmetrical and normal.   Skin:  AK on R temple - abraded lentigos diffusely/ solar aging  Cervical Nodes:  No lymphadenopathy noted Psych:  normal affect, talkative and pleasant    Impression & Recommendations:  Problem # 1:  ESSENTIAL HYPERTENSION (ICD-401.9) Assessment Unchanged  bp is stable - improved on 2nd check today no change in medicines  urged to loose the wt he gained and watch sodium in diet  plan f/u 6 mo  His updated medication list for this problem includes:    Hydrochlorothiazide 50 Mg Tabs (Hydrochlorothiazide) .Marland Kitchen... Take 1 tablet by mouth once a day  BP today: 140/88-- re check 130/82 Prior BP: 140/80 (01/10/2009)  Labs Reviewed: K+: 4.0 (07/09/2009) Creat: : 1.0 (07/09/2009)   Chol: 145 (07/09/2009)   HDL: 36.20 (07/09/2009)   LDL: 87 (07/09/2009)   TG: 107.0 (07/09/2009)  Problem # 2:  HYPERLIPIDEMIA  (ICD-272.4) Assessment: Deteriorated  rev labs with pt - cholesterol is up with LDL 87 explained that goal LDL is 70 or less in light of coronary artery disease  rev sat fats in diet - urged to stop red meat and fast foods  plan lab and f/u 6 mo  His updated medication list for this problem includes:    Lipitor 40 Mg Tabs (Atorvastatin calcium) .Marland Kitchen... Take 1 tablet by mouth once a day  Labs Reviewed: SGOT: 26 (07/09/2009)   SGPT: 27 (07/09/2009)   HDL:36.20 (07/09/2009), 34.80 (01/04/2009)  LDL:87 (07/09/2009), 76 (01/04/2009)  Chol:145 (07/09/2009), 129 (01/04/2009)  Trig:107.0 (07/09/2009), 89.0 (01/04/2009)  Problem # 3:  CAD, AUTOLOGOUS BYPASS GRAFT (ICD-414.02) Assessment: Unchanged is stable with no symptoms -- for routine cardiol f/u His updated  medication list for this problem includes:    Hydrochlorothiazide 50 Mg Tabs (Hydrochlorothiazide) .Marland Kitchen... Take 1 tablet by mouth once a day    Nitrolingual 0.4 Mg/spray Soln (Nitroglycerin) ..... Spray 1-2 spray on tongue    Aspirin Ec 325 Mg Tbec (Aspirin) ..... One by mouth qd  Problem # 4:  ACTINIC KERATOSIS, HEAD (ICD-702.0) Assessment: New AK on R temple needs tx-- pt aware this can be pre cancerous he declined treatment today  he wants to call back with name of new derm he wants to see (disliked Dr Terri Piedra)   Complete Medication List: 1)  Lipitor 40 Mg Tabs (Atorvastatin calcium) .... Take 1 tablet by mouth once a day 2)  Hydrochlorothiazide 50 Mg Tabs (Hydrochlorothiazide) .... Take 1 tablet by mouth once a day 3)  Nitrolingual 0.4 Mg/spray Soln (Nitroglycerin) .... Spray 1-2 spray on tongue 4)  Aspirin Ec 325 Mg Tbec (Aspirin) .... One by mouth qd 5)  Prilosec Otc 20 Mg Tbec (Omeprazole magnesium) .... Take one by mouth daily prn 6)  Multivitamins Tabs (Multiple vitamin) .Marland Kitchen.. 1 daily by mouth 7)  Oxycodone-acetaminophen 5-325 Mg Tabs (Oxycodone-acetaminophen) .... Take one tablet every 4 hours as needed pain 8)  Vicodin 5-500  Mg Tabs (Hydrocodone-acetaminophen) .Marland Kitchen.. 1 by mouth q 6 hours as needed pain 9)  Benadryl Allergy 25 Mg Tabs (Diphenhydramine hcl) .... One by mouth as needed  Patient Instructions: 1)  you have a spot on temple that needs to be treated by dermatologist - so let me know if you want a referral  2)  work on healthy diet and exercise (cholesterol and weight are up today)  3)  you can raise your HDL (good cholesterol) by increasing exercise and eating omega 3 fatty acid supplement like fish oil or flax seed oil over the counter 4)  you can lower LDL (bad cholesterol) by limiting saturated fats in diet like red meat, fried foods, egg yolks, fatty breakfast meats, high fat dairy products and shellfish  5)  follow up with Dr Rayburn Ma for your shoulder pain  6)  schedule fasting labs and then follow up in 6 months -- lipid/ast/alt 272  Prescriptions: PRILOSEC OTC 20 MG  TBEC (OMEPRAZOLE MAGNESIUM) take one by mouth daily prn  #30 x 11   Entered and Authorized by:   Judith Part MD   Signed by:   Judith Part MD on 07/18/2009   Method used:   Print then Give to Patient   RxID:   [id number] HYDROCHLOROTHIAZIDE 50 MG TABS (HYDROCHLOROTHIAZIDE) Take 1 tablet by mouth once a day  #30 x 11   Entered and Authorized by:   Judith Part MD   Signed by:   Judith Part MD on 07/18/2009   Method used:   Print then Give to Patient   RxID:   [id number] LIPITOR 40 MG TABS (ATORVASTATIN CALCIUM) Take 1 tablet by mouth once a day  #30 x 11   Entered and Authorized by:   Judith Part MD   Signed by:   Judith Part MD on 07/18/2009   Method used:   Print then Give to Patient   RxID:   [id number]   Prior Medications (reviewed today): NITROLINGUAL 0.4 MG/SPRAY SOLN (NITROGLYCERIN) Spray 1-2 spray on tongue ASPIRIN EC 325 MG  TBEC (ASPIRIN) one by mouth qd PRILOSEC OTC 20 MG  TBEC (OMEPRAZOLE MAGNESIUM) take one by mouth daily prn MULTIVITAMINS  TABS (MULTIPLE VITAMIN) 1 DAILY BY  MOUTH OXYCODONE-ACETAMINOPHEN 5-325  MG TABS (OXYCODONE-ACETAMINOPHEN) take one tablet every 4 hours as needed pain VICODIN 5-500 MG TABS (HYDROCODONE-ACETAMINOPHEN) 1 by mouth q 6 hours as needed pain BENADRYL ALLERGY 25 MG TABS (DIPHENHYDRAMINE HCL) one by mouth as needed Current Allergies: No known allergies    Preventive Care Screening  Last Tetanus Booster:    Date:  07/06/2005    Results:  Td

## 2010-08-05 NOTE — Assessment & Plan Note (Signed)
Summary: FLU SHOT /RI  Nurse Visit   Allergies: No Known Drug Allergies  Orders Added: 1)  Flu Vaccine 64yrs + MEDICARE PATIENTS [Q2039] 2)  Administration Flu vaccine - MCR [G0008]   Flu Vaccine Consent Questions     Do you have a history of severe allergic reactions to this vaccine? no    Any prior history of allergic reactions to egg and/or gelatin? no    Do you have a sensitivity to the preservative Thimersol? no    Do you have a past history of Guillan-Barre Syndrome? no    Do you currently have an acute febrile illness? no    Have you ever had a severe reaction to latex? no    Vaccine information given and explained to patient? yes    Are you currently pregnant? no    Lot Number:[id number]   Exp Date:01/03/2011   Site Given  Left Deltoid IMu

## 2010-08-05 NOTE — Assessment & Plan Note (Signed)
Summary: 6 MONTH FOLLOW UP/[facility]   Vital Signs:  Patient profile:   75 year old male Height:      72 inches Weight:      187.75 pounds BMI:     25.56 Temp:     98.3 degrees F oral Pulse rate:   60 / minute Pulse rhythm:   regular BP sitting:   136 / 90  (left arm) Cuff size:   regular  Vitals Entered By: Lewanda Rife LPN (January 16, 2010 8:16 AM)  Serial Vital Signs/Assessments:  Time      Position  BP       Pulse  Resp  Temp     By                     130/80                         Judith Part MD  CC: six month f/u   History of Present Illness: here for 6 mo fu of chronic conditions incl lipids and HTN  is doing ok overall  has good days and bad days -- as he gets older   pulled his low back friday -- is sore  used to take methocarbamol 500 as needed up to every 6-8 hours  this helps and needs a refil  muscle spasms - occ during the night   Td 07  lpids recently in fair control with trig 118/ HDL 37 and LDL 80 does watch what he eats most of the time  wishes he could afford a dietitcian    bp first check is 136/90   recently had good cardiol check for CAD   Allergies (verified): No Known Drug Allergies  Past History:  Past Medical History: Last updated: 11/17/2008 Cardiac cath- multi vessel PTCA (01/2000)- CAD GERD/gastritis Hyperlipidemia Nephrolithiasis, hx of partial rotator cuff tear R Hypertension  1. Coronary artery disease status post coronary artery bypass graft     surgery in 2001 with patent grafts in 2003 except for occlusion of     the distal limb of a posterolateral branch. 2. Hypertension 3. Hyperlipidemia 4. Bifascicular block.     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) Unstable angina- diffuse (03/2000) Coronary artery bypass graft 2001 Back surgery Cath  CT abd and pelvis- kidney stones (09/2001) Sinus surgery (04/2002) Stress cardiolite- no acute changes  (10/2003) Neck surgery in past Abd CT Low Grade Obstruction on R by 4mm prox ereteral calculus, Sm bilat renal calculi sm HH ( 10/14/2007) Pelvic CT nml (10/14/2007) hospital 4/09 for surgical removal R ureteral kidnsy stone  2/10 EGD- mild gastritis and GERD 5/10 cardiac cath- multi vessel dz, re occlusion small vessel  Family History: Last updated: 04/30/2007 Father: deceased - ETOH, DM, pacemaker Mother: deceased age 68- TIA's, CAD Siblings: 1 half brother, 3 half sisters  Social History: Last updated: Nov 25, 2008 Marital Status: Wife died last year of lung cancer Children: 2 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 Eyes:  Denies blurring and eye irritation. CV:  Denies chest pain or discomfort, lightheadness, palpitations, and shortness of breath with exertion. Resp:  Denies cough, shortness of breath, sputum productive, and wheezing. GI:  Denies abdominal pain, change in bowel habits, and nausea. GU:  Denies  dysuria. MS:  Complains of joint pain, low back pain, muscle aches, and stiffness; denies cramps and muscle weakness. Derm:  Denies itching, lesion(s), poor wound healing, and rash. Neuro:  Denies numbness and tingling. Psych:  mood is overall fair . Endo:  Denies cold intolerance, excessive thirst, excessive urination, and heat intolerance. Heme:  Denies abnormal bruising and bleeding.  Physical Exam  General:  Well-developed,well-nourished,in no acute distress; alert,appropriate and cooperative throughout examination Head:  normocephalic, atraumatic, and no abnormalities observed.   Eyes:  vision grossly intact, pupils equal, pupils round, and pupils reactive to light.  no conjunctival pallor, injection or icterus  Ears:  quite HOH TMS clear Mouth:  pharynx pink and moist.   Neck:  No  deformities, masses, or tenderness noted. Chest Wall:  No deformities, masses, tenderness or gynecomastia noted. Lungs:  CTA with diffusely distant bs no crackles or wheeze  Heart:  Regular rate and rhythm; no murmurs, rubs,  or bruits. Abdomen:  soft, non-tender, normal bowel sounds, no distention, and no masses.  no renal bruits  Msk:  LS no bony tenderness- some pain on full flex tenderness in R perilumbar musculature  neg slr  nl gait Pulses:  R and L carotid,radial,femoral,dorsalis pedis and posterior tibial pulses are full and equal bilaterally Extremities:  No clubbing, cyanosis, edema, or deformity noted with normal full range of motion of all joints.   Neurologic:  strength normal in all extremities, sensation intact to light touch, gait normal, and DTRs symmetrical and normal.   Skin:  Intact without suspicious lesions or rashes much solar aging and some AKs Cervical Nodes:  No lymphadenopathy noted Inguinal Nodes:  No significant adenopathy Psych:  normal affect, talkative and pleasant    Impression & Recommendations:  Problem # 1:  ESSENTIAL HYPERTENSION (ICD-401.9) Assessment Unchanged  in good control - better on 2nd check today no change in meds continue cardiol f/u plan lab and f/u in 6 mo His updated medication list for this problem includes:    Hydrochlorothiazide 50 Mg Tabs (Hydrochlorothiazide) .Marland Kitchen... Take 1 tablet by mouth once a day  BP today: 136/90 Prior BP: 122/82 (12/12/2009)  Labs Reviewed: K+: 4.0 (07/09/2009) Creat: : 1.0 (07/09/2009)   Chol: 141 (01/13/2010)   HDL: 37.70 (01/13/2010)   LDL: 80 (01/13/2010)   TG: 118.0 (01/13/2010)  Orders: Prescription Created Electronically [phone])  Problem # 2:  HYPERLIPIDEMIA (ICD-272.4) Assessment: Unchanged  this is farily controlled with lipitor suspect could be better with good diet  disc low sat fat diet - pt not very motivated given handout from [facility] on diet lab and f/u in 6 mo His updated  medication list for this problem includes:    Lipitor 40 Mg Tabs (Atorvastatin calcium) .Marland Kitchen... Take 1 tablet by mouth once a day  Labs Reviewed: SGOT: 20 (01/13/2010)   SGPT: 18 (01/13/2010)   HDL:37.70 (01/13/2010), 36.20 (07/09/2009)  LDL:80 (01/13/2010), 87 (07/09/2009)  Chol:141 (01/13/2010), 145 (07/09/2009)  Trig:118.0 (01/13/2010), 107.0 (07/09/2009)  Orders: Prescription Created Electronically [id number])  Problem # 3:  BACK PAIN (ICD-724.5) Assessment: New  acute on chronic with recent strain and no neurol red flags refil muscle relaxer with caution disc heat and stretching  adv to call if worse or not imp  handout given from [facility] His updated medication list for this problem includes:    Aspirin Ec 325 Mg Tbec (Aspirin) ..... One by mouth daily    Methocarbamol 500 Mg Tabs (Methocarbamol) .Marland Kitchen... Take one tablet by mouth every 6-8 hours as needed  for muscle spasm. watch out for sedation  Orders: Prescription Created Electronically [phone])  Complete Medication List: 1)  Lipitor 40 Mg Tabs (Atorvastatin calcium) .... Take 1 tablet by mouth once a day 2)  Hydrochlorothiazide 50 Mg Tabs (Hydrochlorothiazide) .... Take 1 tablet by mouth once a day 3)  Nitrolingual 0.4 Mg/spray Soln (Nitroglycerin) .... Spray 1-2 spray on tongue 4)  Aspirin Ec 325 Mg Tbec (Aspirin) .... One by mouth daily 5)  Prilosec Otc 20 Mg Tbec (Omeprazole magnesium) .... Take one by mouth daily as needed 6)  Multivitamins Tabs (Multiple vitamin) .Marland Kitchen.. 1 daily by mouth 7)  Tylenol Pm Extra Strength 500-25 Mg Tabs (Diphenhydramine-apap (sleep)) .... Otc as directed. 8)  Methocarbamol 500 Mg Tabs (Methocarbamol) .... Take one tablet by mouth every 6-8 hours as needed for muscle spasm. watch out for sedation  Patient Instructions: 1)  I sent px to your pharmacy  2)  use the muscle relaxer as needed  3)  use heat on your back while it gets better - and let me know if it gets worse  4)  no change in medicines  5)   schedule fasting labs and then follow up in 6 months lipid/ast/alt/renal/ cbc with diff/ tsh 401.1, 272 Prescriptions: NITROLINGUAL 0.4 MG/SPRAY SOLN (NITROGLYCERIN) Spray 1-2 spray on tongue  #12 x 1   Entered and Authorized by:   Judith Part MD   Signed by:   Judith Part MD on 01/16/2010   Method used:   [facility]        [facility]  [address] (retail)       [address]       [place], [place]  [address]       Ph: [phone] or [phone]       Fax: [phone]   RxID:   [id number] METHOCARBAMOL 500 MG TABS (METHOCARBAMOL) take one tablet by mouth every 6-8 hours as needed for muscle spasm. watch out for sedation  #60 x 1   Entered and Authorized by:   Judith Part MD   Signed by:   Judith Part MD on 01/16/2010   Method used:   [facility]        [facility]  [address] (retail)       [address]       [place], [place]  [address]       Ph: [phone] or [phone]       Fax: [phone]   RxID:   [id number]   Current Allergies (reviewed today): No known allergies

## 2010-08-05 NOTE — Progress Notes (Signed)
Summary: regarding pneumovax  Phone Note Call from Patient Call back at Home Phone [phone]   Call For: Judith Part MD Summary of Call: Pt is asking if he needs another pneumonia shot, he had one in 2008. Initial call taken by: Lowella Petties CMA,  April 08, 2010 9:27 AM  Follow-up for Phone Call        if he had one in 2008 he was already over 63- thus that was the last one needed Follow-up by: Judith Part MD,  April 08, 2010 11:28 AM  Additional Follow-up for Phone Call Additional follow up Details #1::        Patient notified as instructed by telephone. Pt scheduled appt for flu shot clinic today at 2:50pm.[facility] LPN  April 08, 2010 11:47 AM

## 2010-08-05 NOTE — Assessment & Plan Note (Signed)
Summary: 1 YEAR F/U   Visit Type:  Follow-up Primary Provider:  Roxy Manns, MD   History of Present Illness: Patient is 75 years old and return for followup management of CAD. He had bypass surgery in 2001. In May of 2000 and he had catheterization which showed occlusion of the distal and of the saphenous vein graft to the posterior branch. He has done quite well since that time. He has some sharp left-sided chest pain but no chest pain that sounds anginal. He's had no palpitations and no change in his exercise.  His other problems include hypertension hyperlipidemia. His last lipid profile in January showed total cholesterol 145, an HDL of 36, and an LDL of 87.  Current Medications (verified): 1)  Lipitor 40 Mg Tabs (Atorvastatin Calcium) .... Take 1 Tablet By Mouth Once A Day 2)  Hydrochlorothiazide 50 Mg Tabs (Hydrochlorothiazide) .... Take 1 Tablet By Mouth Once A Day 3)  Nitrolingual 0.4 Mg/spray Soln (Nitroglycerin) .... Spray 1-2 Spray On Tongue 4)  Aspirin Ec 325 Mg  Tbec (Aspirin) .... One By Mouth Daily 5)  Prilosec Otc 20 Mg  Tbec (Omeprazole Magnesium) .... Take One By Mouth Daily As Needed 6)  Multivitamins  Tabs (Multiple Vitamin) .Marland Kitchen.. 1 Daily By Mouth 7)  Tylenol Pm Extra Strength 500-25 Mg Tabs (Diphenhydramine-Apap (Sleep)) .... Otc As Directed.  Allergies: No Known Drug Allergies  Past History:  Past Medical History: Reviewed history from 11/17/2008 and no changes required. Cardiac cath- multi vessel PTCA (01/2000)- CAD GERD/gastritis Hyperlipidemia Nephrolithiasis, hx of partial rotator cuff tear R Hypertension  1. Coronary artery disease status post coronary artery bypass graft     surgery in 2001 with patent grafts in 2003 except for occlusion of     the distal limb of a posterolateral branch. 2. Hypertension 3. Hyperlipidemia 4. Bifascicular block.     cardiol-Barbi Kumagai  Review of Systems       ROS is negative except as outlined in HPI.   Vital  Signs:  Patient profile:   75 year old male Height:      72 inches Weight:      188 pounds Pulse rate:   64 / minute Pulse rhythm:   regular BP sitting:   122 / 82  (right arm)  Vitals Entered By: Jacquelin Hawking, CMA (December 12, 2009 10:33 AM)  Physical Exam  Additional Exam:  Gen. Well-nourished, in no distress   Neck: No JVD, thyroid not enlarged, no carotid bruits Lungs: No tachypnea, clear without rales, rhonchi or wheezes Cardiovascular: Rhythm regular, PMI not displaced,  heart sounds  normal, no murmurs or gallops, no peripheral edema, pulses normal in all 4 extremities. Abdomen: BS normal, abdomen soft and non-tender without masses or organomegaly, no hepatosplenomegaly. MS: No deformities, no cyanosis or clubbing   Neuro:  No focal sns   Skin:  no lesions    Impression & Recommendations:  Problem # 1:  CAD, AUTOLOGOUS BYPASS GRAFT (ICD-414.02)  He had bypass surgery in 2001 and had documented occlusion of the distal limb of the saphenous vein graft to a posterolateral branch in May of 2010. He has had no recent angina and his palm appears stable. His updated medication list for this problem includes:    Nitrolingual 0.4 Mg/spray Soln (Nitroglycerin) ..... Spray 1-2 spray on tongue    Aspirin Ec 325 Mg Tbec (Aspirin) ..... One by mouth daily  Orders: EKG w/ Interpretation (93000)  Problem # 2:  HYPERLIPIDEMIA (ICD-272.4) This is been followed  by Dr. Milinda Antis with reasonably good values in January 2011. His updated medication list for this problem includes:    Lipitor 40 Mg Tabs (Atorvastatin calcium) .Marland Kitchen... Take 1 tablet by mouth once a day  Problem # 3:  ESSENTIAL HYPERTENSION (ICD-401.9) His blood pressure is well controlled on current medications. His updated medication list for this problem includes:    Hydrochlorothiazide 50 Mg Tabs (Hydrochlorothiazide) .Marland Kitchen... Take 1 tablet by mouth once a day    Aspirin Ec 325 Mg Tbec (Aspirin) ..... One by mouth daily  Patient  Instructions: 1)  Your physician wants you to follow-up in: 1 year with Dr. Clifton James.  You will receive a reminder letter in the mail two months in advance. If you don't receive a letter, please call our office to schedule the follow-up appointment. 2)  Your physician recommends that you continue on your current medications as directed. Please refer to the Current Medication list given to you today.

## 2010-08-07 NOTE — Assessment & Plan Note (Signed)
Summary: 6 MONTH FOLLOWUP/[facility]   Vital Signs:  Patient profile:   75 year old male Weight:      187.50 pounds BMI:     25.52 Temp:     98.1 degrees F oral Pulse rate:   64 / minute Pulse rhythm:   regular BP sitting:   138 / 80  (left arm) Cuff size:   regular  Vitals Entered By: Selena Batten Dance CMA (AAMA) (July 22, 2010 8:03 AM)  Serial Vital Signs/Assessments:  Time      Position  BP       Pulse  Resp  Temp     By                     135/82                         Judith Part MD  CC: 6 month follow up   History of Present Illness: here for f/u of HTN and lipids (in pt with known CAD)  is doing fair overall  had some neck problems in nov -- could not move his neck - very painful saw Dr Rayburn Ma / Jorge Mandril  he will schedule follow up himself  he stays uncomfortable - and pain goes up into head occasionally  problems with heel also -- worse when he gets up after sitting   sinuses stay the same  sleep is fair    labs look ok overall   HTN in fair control  first bp 138/80  lipids are good with trig 89 and HDL 39 and LDL 80  status of CAD - doing well  occ chest pains- was told nothing to worry about  has f/u once per year in the summer   still has cramps in his legs and hips occasionally no longer on muscle relaxers   Allergies (verified): No Known Drug Allergies  Past History:  Past Surgical History: Last updated: 11/17/2008 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) Unstable angina- diffuse (03/2000) Coronary artery bypass graft 2001 Back surgery Cath  CT abd and pelvis- kidney stones (09/2001) Sinus surgery (04/2002) Stress cardiolite- no acute changes (10/2003) Neck surgery in past Abd CT Low Grade Obstruction on R by 4mm prox ereteral calculus, Sm bilat renal calculi sm HH ( 10/14/2007) Pelvic CT nml (10/14/2007) hospital 4/09 for surgical removal R ureteral kidnsy stone  2/10 EGD- mild gastritis and GERD 5/10 cardiac cath- multi vessel dz, re occlusion  small vessel  Family History: Last updated: May 05, 2007 Father: deceased - ETOH, DM, pacemaker Mother: deceased age 29- TIA's, CAD Siblings: 1 half brother, 3 half sisters  Social History: Last updated: 11-30-2008 Marital Status: Wife died last year of lung cancer Children: 2 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 Coronary artery disease status post coronary artery bypass graft     surgery in 2001 with patent grafts in 2003 except for occlusion of     the distal limb of a posterolateral branch. 2. Hypertension 3. Hyperlipidemia 4. Bifascicular block.     cardiol-Brodie urologist --  Review of Systems General:  Denies fatigue, loss of appetite, and malaise. Eyes:  Denies blurring and eye pain. ENT:  Complains of nasal congestion and postnasal drainage; this is chronic . CV:  Denies chest pain or discomfort, palpitations, and shortness of breath with exertion; no cp recently . Resp:  Denies cough and wheezing. GI:  Denies nausea and vomiting. MS:  Complains of joint pain and cramps; will disc heel pain with ortho and also chronic cramps and neck pain. Derm:  Denies itching, lesion(s), poor wound healing, and rash. Neuro:  Denies numbness and tingling. Psych:  mood is fair . Endo:  Denies excessive thirst and excessive urination. Heme:  Denies abnormal bruising and bleeding.  Physical Exam  General:  Well-developed,well-nourished,in no acute distress; alert,appropriate and cooperative throughout examination Head:  normocephalic, atraumatic, and no abnormalities observed.   Eyes:  vision grossly intact, pupils equal, pupils round, and pupils reactive to light.  no conjunctival pallor, injection or icterus   Ears:  is HOH Mouth:  pharynx pink and moist.   Neck:  supple with full rom and no masses or thyromegally, no JVD or carotid bruit  Chest Wall:  No deformities, masses, tenderness or gynecomastia noted. Lungs:  CTA with diffusely distant bs no crackles or wheeze  Heart:  Regular rate and rhythm; no murmurs, rubs,  or bruits. Abdomen:  soft, non-tender, normal bowel sounds, no distention, and no masses.  no renal bruits  Msk:  No deformity or scoliosis noted of thoracic or lumbar spine.  some difficulty fully rot neck  Pulses:  R and L carotid,radial,femoral,dorsalis pedis and posterior tibial pulses are full and equal bilaterally Extremities:  No clubbing, cyanosis, edema, or deformity noted with normal full range of motion of all joints.   Neurologic:  sensation intact to light touch, gait normal, and DTRs symmetrical and normal.   Skin:  Intact without suspicious lesions or rashes AKs noted diffusely Cervical Nodes:  No lymphadenopathy noted Psych:  HOH -- nl affect    Impression & Recommendations:  Problem # 1:  ESSENTIAL HYPERTENSION (ICD-401.9) Assessment Unchanged  this is fairly controlled urged to keep up the exercise and avoid salt  f/u 6 mo  labs reviewed  His updated medication list for this problem includes:    Hydrochlorothiazide 50 Mg Tabs (Hydrochlorothiazide) .Marland Kitchen... Take 1 tablet by mouth once a day  BP today: 138/80 Prior BP: 136/90 (01/16/2010)  Labs Reviewed: K+: 3.9 (07/15/2010) Creat: : 0.8 (07/15/2010)   Chol: 137 (07/15/2010)   HDL: 39.40 (07/15/2010)   LDL: 80 (07/15/2010)   TG: 89.0 (07/15/2010)  Orders: Prescription Created Electronically [phone])  Problem # 2:  Hx of ACTINIC KERATOSIS (ICD-702.0) Assessment: Unchanged adv pt is time to see derm for new AKs noted on face   Problem # 3:  HYPERLIPIDEMIA (ICD-272.4) Assessment: Unchanged  this is well controlled with lipitor will stay with name brand until his insurance lowers price disc low  sat fat diet  rev labs with pt His updated medication list for this problem includes:    Lipitor 40 Mg Tabs (Atorvastatin calcium) .Marland Kitchen... Take 1 tablet by mouth once a day  Labs Reviewed: SGOT: 22 (07/15/2010)   SGPT: 24 (07/15/2010)   HDL:39.40 (07/15/2010), 37.70 (01/13/2010)  LDL:80 (07/15/2010), 80 (01/13/2010)  Chol:137 (07/15/2010), 141 (01/13/2010)  Trig:89.0 (07/15/2010), 118.0 (01/13/2010)  Orders: Prescription Created Electronically [phone])  Problem # 4:  NECK PAIN (ICD-723.1) will f/u with ortho for this  His updated medication list for this problem includes:    Aspirin Ec 325 Mg Tbec (Aspirin) ..... One by mouth daily    Methocarbamol 500 Mg Tabs (Methocarbamol) .Marland Kitchen... Take one tablet by mouth every 6-8 hours as needed for muscle  spasm. watch out for sedation  Complete Medication List: 1)  Lipitor 40 Mg Tabs (Atorvastatin calcium) .... Take 1 tablet by mouth once a day 2)  Hydrochlorothiazide 50 Mg Tabs (Hydrochlorothiazide) .... Take 1 tablet by mouth once a day 3)  Nitrolingual 0.4 Mg/spray Soln (Nitroglycerin) .... Spray 1-2 spray on tongue 4)  Aspirin Ec 325 Mg Tbec (Aspirin) .... One by mouth daily 5)  Prilosec Otc 20 Mg Tbec (Omeprazole magnesium) .... Take one by mouth daily as needed 6)  Multivitamins Tabs (Multiple vitamin) .Marland Kitchen.. 1 daily by mouth 7)  Tylenol Pm Extra Strength 500-25 Mg Tabs (Diphenhydramine-apap (sleep)) .... Otc as directed. 8)  Methocarbamol 500 Mg Tabs (Methocarbamol) .... Take one tablet by mouth every 6-8 hours as needed for muscle spasm. watch out for sedation  Patient Instructions: 1)  no change in medicine  2)  try tonic water 4-8 oz at bedtime for cramps  3)  follow up with orthopedics as planned- and mention the heel problem and the cramps as well 4)  we will stay with name brand lipitor  5)  keep up the good exercise  6)  be sure to see the dermatologist for your skin- it is time  7)  schedule fasting labs in 6 months and then follow up   8)  lipid/ast/alt / renal  272, 401.1 Prescriptions: HYDROCHLOROTHIAZIDE 50 MG TABS (HYDROCHLOROTHIAZIDE) Take 1 tablet by mouth once a day  #30 x 11   Entered and Authorized by:   Judith Part MD   Signed by:   Judith Part MD on 07/22/2010   Method used:   [facility]        [facility]  [address] ([facility]ail[facility]       [address]       [place], [place]  [address]       Ph: [phone] or [phone]       Fax: [phone]   RxID:   [id number] LIPITOR 40 MG TABS (ATORVASTATIN CALCIUM) Take 1 tablet by mouth once a day Brand medically necessary #30 x 11   Entered and Authorized by:   Judith Part MD   Signed by:   Judith Part MD on 07/22/2010   Method used:   [facility]        [facility]  [address] [facility]       [address]       [place], [place]  [address]       Ph: [phone] or [phone]       Fax: [phone]   RxID:   [id number]    Orders Added: 1)  Prescription Created Electronically [G8553] 2)  Est. Patient Level IV [46270]    Current Allergies (reviewed today): No known allergies

## 2010-11-18 NOTE — Discharge Summary (Signed)
NAMEMARSHON, BANGS                 ACCOUNT NO.:  [id number]   MEDICAL RECORD NO.:  [id number]          PATIENT TYPE:  INP   LOCATION:  1515                         FACILITY:  [facility]   PHYSICIAN:  Sigmund I. Patsi Sears, M.D.DATE OF BIRTH:  1935-09-23   DATE OF ADMISSION:  10/19/2007  DATE OF DISCHARGE:  10/22/2007                               DISCHARGE SUMMARY   FINAL DIAGNOSIS:  Impacted 5-mm right upper ureteral calculus.   OPERATION THIS ADMISSION:  October 21, 2007, cystourethroscopy right  retrograde pyelogram with interpretation, right ureteroscopy, laser  fractionation of right upper ureteral impacted stone and basket  extraction of right ureteral stone with right double-J catheter  placement.   HISTORY:  Mr. Wyne is a 75 year old male admitted ap 15, 2009, with  acute and chronic right ureteral colic after diagnosis made of right  ureteral impacted stone at the [facility] emergency room.  The  patient was unable to tolerate pain medication, became quite obstipated  with continued of right flank pain, nausea and vomiting.  He was  admitted to the hospital for pain control and evaluation.   PAST MEDICAL HISTORY:  1. Heart disease.  2. GERD.  3. Anxiety.  4. Arrhythmia.  5. Constipation.   MEDICATIONS:  Are as noted in H&P of April 15.   ALLERGIES:  None known.   Family history, social history, review of systems, physical examination  are all unchanged since examination of October 19, 2007.   HOSPITAL COURSE:  On April 17, the patient underwent cystourethroscopy,  right retrograde pyelogram with interpretation, ureteroscopy, laser  fractionation of impacted right ureteral calculus with basket extraction  of stone fragments and right double-J catheter placement.  A Foley  catheter was placed at time of surgery because of edema at the bladder  neck.   The Foley catheter is to be removed on the day of discharge, April 18,  and he is placed on Flomax, Cipro,  Prosed DS p.r.n., as well as Percocet  p.r.n. pain.  He will return for follow-up and the stone will be sent  for analysis.  He will be discharged in good condition.      Sigmund I. Patsi Sears, M.D.  Electronically Signed     SIT/MEDQ  D:  10/21/2007  T:  10/21/2007  Job:  [id number]

## 2010-11-18 NOTE — H&P (Signed)
NAMEHAO, [name]   MEDICAL RECORD NO.:  [id number]          PATIENT TYPE:  INP   LOCATION:  1515                         FACILITY:  [facility]   PHYSICIAN:  Sigmund I. Patsi Sears, M.D.DATE OF BIRTH:  11-09-1935   DATE OF ADMISSION:  10/19/2007  DATE OF DISCHARGE:  10/21/2007                              HISTORY & PHYSICAL   HISTORY OF PRESENT ILLNESS:  [name] is a 75 year old male admitted  on October 19, 2007, when seen in follow-up from the emergency room for a  4-5 mm impacted right ureteral calculus.  The patient is from  [place], [place].  He is a retired Scientist, water quality.  He noted  sudden onset of right flank pain at 7:30 on Friday night with nausea,  vomiting, fever, and chills.  The patient went to [facility] Emergency  Room, was noted to have microhematuria, with CT showing bilateral renal  stones, and partially obstructing 4 mm proximal right ureteral calculus  with obstruction.  He was treated with Dilaudid and Phenergan, and sent  home.  The patient had recurrent right flank pain, taking oxycodone,  with minimal easing.  He was seen in the office, with severe right  ureteral colic, admitted to the hospital with severe obstipation, as  well as nausea, vomiting and renal colic.   PAST HISTORY:  1. GERD.  2. Anxiety.  3. Arrhythmia.  4. Constipation.   PAST SURGERIES:  1. Heart surgery.  2. Neck surgery.   MEDICATIONS:  1. Aspirin 325 mg daily.  2. Hydrochlorothiazide 50 mg a day.  3. Hydrocodone p.r.n.  4. Lipitor 40 mg p.o. per day.  5. Nitrolingual 0.4 mg spray p.r.n.  6. Oxycodone p.r.n.  7. Prilosec 20 mg delayed release per day.  8. Promethazine 25 mg one p.o. per day.  9. Robaxin 500 mg one p.o. per day.   ALLERGIES:  None known.   FAMILY HISTORY:  Significant for two daughters and wife.  Alcohol:  None  times 25 years.  Caffeine:  Occasional.  Tobacco:  None times 8 years.   REVIEW OF SYSTEMS:   CONSTITUTIONAL:  Significant for right flank pain,  abdominal discomfort, nausea and vomiting.  The patient also has  nocturia, but no frequency, urgency, dysuria or incontinence.   PHYSICAL EXAMINATION:  GENERAL:  A well-developed white male with right  renal colic.  VITAL SIGNS:  Blood pressure 150/90, temperature 97.9, heart rate 79.  NECK:  Supple, nontender.  No nodes.  CHEST:  Clear to P&A.  ABDOMEN: Soft, decreasing bowel sounds.  The patient has right CVA and  right lower quadrant pain.  GU:  Exam shows normal penis, normal urethra, normal testicles.  Testicles are descended bilaterally, measure 3 x 3 cm and nontender.  RECTAL:  Normal sphincter tone.  Prostate 3+.  Liver benign.  No masses,  no blood.  EXTREMITIES:  No cyanosis, no edema.   IMPRESSION:  Acute and chronic ureteral colic secondary to impacted  stone.  The patient is admitted for pain control, will probably need  surgical relief of his kidney stone.  Sigmund I. Patsi Sears, M.D.  Electronically Signed     SIT/MEDQ  D:  10/21/2007  T:  10/21/2007  Job:  [id number]

## 2010-11-18 NOTE — Assessment & Plan Note (Signed)
[name]                            CARDIOLOGY OFFICE NOTE   NAME:[name], [name]                        MRN:          [id number]  DATE:11/07/2007                            DOB:          12-08-1935    PRIMARY CARE PHYSICIAN:  Joel A. Tower, MD.   CLINICAL HISTORY:  Joel [name] is 75 years old and returns for follow-up  management of his coronary heart disease.  He had bypass surgery in  2001.  He has done well since that time.  He does have some left-sided  chest pain which is not exertional which has been present over the past  couple of years.  Does not have any predictable exertional chest pain.   PAST MEDICAL HISTORY:  Significant for hypertension and hyperlipidemia.   SOCIAL HISTORY:  His wife has cancer of the lung and he is a full-time  caregiver for her.   CURRENT MEDICATIONS:  1. Hydrochlorothiazide 50 mg daily.  2. Aspirin.  3. Lipitor 40 mg daily.  4. Prilosec.  5. Flomax.   PHYSICAL EXAMINATION:  The blood pressure is 145/89, pulse 89 and  regular.  There is no venous tension.  The carotid pulses were full  without bruits.  The chest was clear.  HEART:  Rhythm was regular.  I could hear no murmurs or gallops.  ABDOMEN:  Soft with normal bowel sounds.  There is no  hepatosplenomegaly. Peripheral pulses are full. There is no peripheral  edema.   An electrocardiogram showed bifascicular block.   LABORATORY STUDIES:  LDL of 105 and HDL of 40.   IMPRESSION:  1. Coronary artery disease status post coronary artery bypass graft      surgery in 2001 with patent grafts in 2003 except for occlusion of      the distal limb of a posterolateral branch.  2. Hypertension not under quite optimal control.  3. Hyperlipidemia not a target on LDL.  4. Bifascicular block.   RECOMMENDATIONS:  Overall I think Joel [name] is doing well.  He is now  8 years post bypass surgery and so he is at risk for progression of his  graft disease.  We had  considered a stress test last year but he is  reluctant because his wife is sick and would like to postpone this.  I  told him we would probably want to do one sometime in the near future  when his situation is better.  His hypertension and hyperlipidemia are  not quite at target but he is reluctant to change  medications.  I encouraged him to watch his salt in his diet for his  blood pressure and to tighten up on his diet for his cholesterol.  We  will plan to see him back in follow-up in the year.     Bruce Elvera Lennox Juanda Chance, MD, [facility]  Electronically Signed    BRB/MedQ  DD: 11/07/2007  DT: 11/07/2007  Job #: [id number]

## 2010-11-18 NOTE — Cardiovascular Report (Signed)
[name], [name]                 ACCOUNT NO.:  [id number]   MEDICAL RECORD NO.:  [id number]          PATIENT TYPE:  OIB   LOCATION:  1961                         FACILITY:  [facility]   PHYSICIAN:  Joel R. Juanda Chance, MD, FACCDATE OF BIRTH:  01-12-1936   DATE OF PROCEDURE:  11/15/2008  DATE OF DISCHARGE:  11/15/2008                            CARDIAC CATHETERIZATION   CLINICAL HISTORY:  Joel [name] is 75 years old and had bypass surgery in  2001.  He has known occlusion of a vein graft to the posterolateral  branch of the right coronary artery.  He recently had a increased  symptoms of dyspnea and had an abnormal ECG with inferior T-wave  changes.  We brought him in for evaluation and angiography.   PROCEDURE:  The procedure was performed via the right femoral artery  using an arterial sheath and 4-French preformed coronary catheters.  A  front wall arterial puncture was performed, and Omnipaque contrast was  used.  The patient tolerated the procedure well and left the laboratory  in satisfactory condition.   RESULTS:  The left main coronary artery:  The left main coronary artery  is free of significant disease.   The left anterior descending artery:  The left anterior descending  artery gave rise to a septal perforator and 2 diagonal branches and then  there was competing flow distally.  There were 70 and 90% stenoses in  the midvessel.   The circumflex artery:  The circumflex artery gave rise to a ramus  branch and atrial branch, a marginal branch, and 2 small posterolateral  branches.  There was 90% stenosis in the proximal portion of the  marginal branch with competing flow distally.   The right coronary artery:  The right coronary artery is a moderately  large vessel that gave rise to a right ventricular branch, a posterior  descending branch, and 3 posterolateral branches.  There were 90 and 70%  stenoses in the mid-to-distal vessel and competing flow distally.   The saphenous  vein graft to the distal right coronary was patent and  functioned normally.  There was 30% proximal narrowing.   The saphenous vein graft to the marginal branch of the circumflex artery  was patent and functioned normally.   The LIMA graft to the LAD was patent and functioned normally.   The saphenous vein graft to the posterolateral branch of the right  coronary artery was completely occluded at its origin.   The left ventriculogram:  The left ventriculogram performed in the RAO  projection showed overall good wall motion.  The mid inferior wall was  slightly hypokinetic.  The estimated ejection fraction was 60%.   The aortic pressure was 139/81 with a mean of 104, and left ventricular  pressure was 139/19.   CONCLUSION:  1. Coronary artery disease status post coronary bypass graft surgery      in 2001.  2. Severe native vessel disease with 70% and 90% mid stenosis in the      left anterior descending, 90% stenosis in the marginal branch of      the  circumflex artery, and 90 and 70% stenoses in the distal right      coronary artery.  3. Patent vein graft to distal right coronary artery with 30% proximal      narrowing, patent vein graft to the marginal branch of the      circumflex artery, patent left internal mammary artery graft to the      mid left anterior descending, and occluded vein graft to the      posterolateral branch of the right coronary artery.  4. Slight inferior wall hypokinesis with overall good left ventricular      function with an estimated fraction of 60%.   RECOMMENDATION:  The patient's anatomy has not changed from the last  study.  He has occlusion of a vein graft to a posterolateral branch, but  this must have been a very small vessel.  Overall, the circulation looks  quite good.  We will plan reassurance.  We may need to look for other  causes for his shortness of breath.      Joel Elvera Lennox Juanda Chance, MD, [facility]  Electronically Signed     BRB/MEDQ  D:   11/15/2008  T:  11/15/2008  Job:  [id number]   cc:   Marne A. Milinda Antis, MD

## 2010-11-18 NOTE — Op Note (Signed)
Joel [name], Joel [name]                 ACCOUNT NO.:  [id number]   MEDICAL RECORD NO.:  [id number]          PATIENT TYPE:  INP   LOCATION:  1515                         FACILITY:  [facility]   PHYSICIAN:  Sigmund I. Patsi Sears, M.D.DATE OF BIRTH:  02/01/1936   DATE OF PROCEDURE:  10/21/2007  DATE OF DISCHARGE:                               OPERATIVE REPORT   PREOPERATIVE DIAGNOSIS:  Impacted right midureteral calculus.   POSTOPERATIVE DIAGNOSIS:  Impacted right midureteral calculus.   OPERATION:  1. Cystourethroscopy.  2. Right retrograde pyelogram with interpretation.  3. Right ureteroscopy.  4. Laser fragmentation of right midureteral stone with impaction.  5. Right double-J catheter.   SURGEON:  Sigmund I. Patsi Sears, M.D.   ANESTHESIA:  General LMA.   PREPARATION:  After appropriate preanesthesia, the patient is brought to  the operating room and placed on the operating table in the dorsal  supine position, where general LMA anesthesia was introduced.  He was  then replaced in dorsal lithotomy position, where the pubis was prepped  with Betadine solution and draped in the usual fashion.   REVIEW OF HISTORY:  Mr. Sperbeck is a 75 year old male, admitted on October 19, 2007, with 1 week of recurrent right ureteral colic and severe  obstipation.  He was admitted for pain control, IV fluids and relief of  his obstipation.  He has been unable to pass the stone, continued to  have renal colic, and is now for cysto, retrograde pyelogram and  ureteroscopy and basket extraction.   PROCEDURE:  Cystourethroscopy was accomplished and shows a normal-  appearing urethra with some elevation of his bladder neck and trilobar  BPH.  The bladder appears trabeculated but without cellules.  There is  no evidence of bladder stone, tumor, or diverticular formation.   Right retrograde pyelogram was performed and shows a right midureteral  stone measuring approximately 5 mm.  A guidewire was passed around  the  stone into the kidney.  The ureter was dilated by passing the flexible  ureteroscopic dilator to the level of stone, and this was removed.  A  short semi-rigid scope was then passed up to the level of stone and the  stone was identified visually and photo-documented.  The stone is broken  in two pieces with the holmium laser.  Of note is that the stone was  very hard, required increased energy from the laser for fractionation.  The pieces of stone were removed, although there were some tiny  fragments left in the ureter.  There was edema within the ureter.  Repeat ureteroscopy revealed the ureteral edema and erythema, but no  other stone was identified.  The scope was passed to the level of the  renal pelvis.   The ureteroscope was removed and the cystoscope replaced, and a 6-French  x 24-cm double-J catheter was passed into the renal pelvis and into the  bladder.  It was coiled in normal position.  A Foley catheter was left  because of the edema and the bladder outlet obstructive phenomenon  identified at cystoscopy.  The patient was then awakened and taken  to  the recovery room in good condition.      Sigmund I. Patsi Sears, M.D.  Electronically Signed     SIT/MEDQ  D:  10/21/2007  T:  10/21/2007  Job:  [id number]

## 2010-11-21 NOTE — Op Note (Signed)
NAME:  Joel [name], Joel [name]                           ACCOUNT NO.:  [id number]   MEDICAL RECORD NO.:  [id number]                   PATIENT TYPE:  AMB   LOCATION:  DSC                                  FACILITY:  [facility]   PHYSICIAN:  Kristine Garbe. Ezzard Standing, M.D.         DATE OF BIRTH:  07/09/1935   DATE OF PROCEDURE:  04/11/2002  DATE OF DISCHARGE:                                 OPERATIVE REPORT   PREOPERATIVE DIAGNOSES:  Pansinus disease with nasal polyposis.   POSTOPERATIVE DIAGNOSES:  Pansinus disease with nasal polyposis.  Total  opacification of the frontal sinuses, ethmoid regions, sphenoid sinuses and  partial opacification of the maxillary sinuses with findings consistent with  sinonasal polyposis.   OPERATION PERFORMED:  Functional endoscopic sinus surgery with bilateral  total ethmoidectomy with removal of polypoid disease, bilateral maxillary  ostial enlargement with removal of polypoid disease.  Bilateral exploration  of nasal frontal duct region with removal of polypoid disease, bilateral  sphenoidotomies with removal of polyps.   SURGEON:  Kristine Garbe. Ezzard Standing, M.D.   ANESTHESIA:  General endotracheal.   COMPLICATIONS:  None.   INDICATIONS FOR PROCEDURE:  The patient is a 75 year old gentleman with  longstanding sinonasal problems with main complaint of nasal obstruction and  decreased sense of smell.  He has taken steroids in the past with some  temporary relief but continues to have recurrent problems with nasal  obstruction and decreased sense of smell.  On examination, he has bilateral  nasal polyposis and CT scan showed extensive pansinus disease consistent  with sinonasal polyps.  He was taken to the operating room at this time for  endoscopic sinus surgery with removal of polypoid disease.   DESCRIPTION OF PROCEDURE:  After adequate endotracheal anesthesia, the  patient received 8 mg of Decadron IV preoperatively as well as 1 gm of Ancef  IV preoperatively.  The  nose was prepped with Betadine solution and draped  out with sterile towels.  The nose was then further prepped with cotton  pledgets soaked in Afrin.  The middle meatus and turbinate area was injected  with Xylocaine with epinephrine for hemostasis.  On examination, the patient  had polyps of both medial as well as lateral to the middle turbinate  extending up to the apex of the nasal cavity.  There was actually polypoid  mucosa extruding off of the septum mucosa and the middle turbinates were  polypoid throughout.  Using the microdebrider, the right side was approached  first.  Polyps were removed from the middle meatus as well as polyps removed  from the medial aspect of the middle turbinate including removal of most of  the middle turbinate.  The top quarter of the middle turbinate was preserved  to identify approach to the nasal frontal region.  Following this, the  anterior and posterior ethmoid regions were opened up.  On the right side,  there was a small dehiscent area of  lamina with a little bit of exposed  periorbita but this was minimal with no extensive bleeding.  The maxillary  ostia was identified with a curved suction, was enlarged with back biting  straight through cup forceps and polypoid disease was removed from the right  maxillary sinus.  As was some very thick mucus.  After opening up the  posterior ethmoid region on the right side.  The sphenoid sinus was opened.  The microdebrider was used to remove polypoid disease from the sphenoid  sinus region on the right side.  Lastly, the nasal frontal area was  explored.  Polypoid disease extending all the way up to the nasal frontal  duct was able to expose the lower aspect of the nasal frontal duct and was  able to gently slide the curved small suction up into the nasal frontal area  but nothing else was done in this region.  Packs were placed for hemostasis.  The left side was approached.  Again, the patient had similar  findings with  polyps arising from both the medial as well as lateral aspect of the middle  turbinate.  Polyps arising throughout the ethmoid area, nasal frontal area  as well as maxillary sinus region.  The microdebrider was used to remove the  polyps from the anterior and posterior ethmoid area as was the lower portion  of the middle turbinate on the left side which was also very polypoid was  amputated.  The sphenoid sinus was opened up posteriorly and enlarged and  polypoid disease removed from the sphenoid sinus with a microdebrider.  The  maxillary ostia was identified, was enlarged with back biting straight  through cup forceps and polypoid disease was removed from the maxillary  sinus likewise.  The maxillary sinus mucosa was polypoid throughout and  removed just the larger polyps from around the maxillary ostia enlargement.  Finally, the nasal frontal area was explored again and the right angle  forceps was used to remove polypoid disease from the lower aspect of the  nasal frontal duct area to expose this and allow placement of a small curved  suction up into the frontal region.  Again the mucosa was very polypoid.  Hemostasis was obtained with the suction cautery.  Lastly the anterior  turbinates were injected with approximately 30 mg of Depo-Medrol each.  The  inferior turbinates were outfractured.  This completed the procedure.  Merocel packs were cut to appropriate size to place within the ethmoid area  which extended posteriorly to the sphenoid opening and anteriorly up into  the nasal frontal region.  They were then hydrated with Xylocaine with  epinephrine.  The patient was awakened from anesthesia postoperatively doing  well.   DISPOSITION::  The patient is discharged to home later this morning on  Keflex 500 mg b.i.d. for one week, Tylenol and Tylenol #3 p.r.n. pain.  Call my office in three days for recheck and remove the Merocel packs from the  nose.                                                  Kristine Garbe. Ezzard Standing, M.D.    CEN/MEDQ  D:  04/11/2002  T:  04/11/2002  Job:  [id number]   cc:   Marne A. Milinda Antis, M.D. [facility]

## 2010-11-21 NOTE — Op Note (Signed)
[place]. [facility]  Patient:    HARTFORD, MAULDEN                        MRN: [id number] Proc. Date: 03/23/00 Adm. Date:  [id number] Attending:  Mikey Bussing CC:         CVTS Office             Office of [facility]                           Operative Report  OPERATION:  Coronary artery bypass grafting x 4 (left internal mammary artery to left anterior descending, saphenous vein graft to obtuse marginal, saphenous vein graft to posterior descending, saphenous vein graft to posterolateral branch of the right coronary artery).  PREOPERATIVE DIAGNOSIS: Class 4 unstable angina with severe 3-vessel disease.  POSTOPERATIVE DIAGNOSIS:  Class 4 unstable angina with severe 3-vessel disease.  SURGEON:  Mikey Bussing, M.D.  ASSISTANT:  Maxwell Marion, RNFA  ANESTHESIA:  General by Dr. Diamantina Monks.  INDICATIONS:  The patient is a 75 year old male, who presented with symptoms of unstable angina and ruled out for MI.  Cardiac catheterization demonstrated a 95% stenosis at the distal right coronary artery with intracoronary thrombus, as well as, high-grade stenosis of the LAD and circumflex marginal. He was placed on IV heparin and Integrilin and referred for coronary revascularization.  Prior to the operation, I examined the patient in his intensive care unit room and discussed the results of the cardiac catheterization with the patient and his family.  I discussed the indications and expected benefits of coronary bypass grafting.  I reviewed the major aspects of the operation, including the choice of conduit, the use of cardiopulmonary bypass and general anesthesia, the location of the surgical incisions, and the expected hospital recovery period.  I discussed the alternatives to surgical therapy for his medical problem.  I reviewed the risks associated with this operation, including the risks of MI, CVA, bleeding, infection, blood  transfusion requirement, and death.  He understood the implications for surgery and agreed to proceed with the operation as planned under informed consent.  OPERATIVE FINDINGS:  The saphenous vein was harvested from the right leg and was average quality.  The mammary artery was small, but with excellent flow. The coronaries were adequate targets.  However, the posterolateral was a small vessel and would not be a good vessel for redo grafting.  PROCEDURE:  The patient was brought to the operating room and placed supine on the operating room table, where general anesthesia was induced under invasive hemodynamic monitoring.  The chest, abdomen and legs were prepped with Betadine and draped as a sterile field.  A median sternotomy was performed as the saphenous vein was harvested from the right leg.  The internal mammary artery was harvested as a pedicle graft from its origin at the subclavian vessels and was a good vessel with excellent flow.  Heparin was administered and the ACT was documented as being therapeutic.  Through purse-strings placed in the ascending aorta and right atrium, the patient was cannulated and placed on bypass and cooled to 32 degrees.  The coronaries were inspected and the mammary artery and vein grafts were prepared for the distal anastomoses.  A cardioplegia cannula was placed.  As the aortic cross-clamp was applied, 500 cc of cold-blood cardioplegia was delivered to the aortic root with immediate cardioplegic arrest  and septal temperature dropping less than 12 degrees. Topical ice saline slush was used to augment myocardial preservation and a pericardial insulator pad was used to protect the left phrenic nerve.  The distal coronary anastomoses were then performed.  The first distal anastomosis was to posterior descending.  This was a 1.5 mm vessel with proximal 95% stenosis and a reverse saphenous vein was sewn end-to-side with a running 7-0 Prolene with good flow  through the graft.  The second distal anastomosis was to the posterolateral branch of the right coronary artery, which was a 1.0 mm vessel with proximal 95% stenosis.  A reverse saphenous vein was sewn end-to-side with running 7-0 Prolene and there was adequate flow through the graft.  The third distal anastomoses was to the obtuse marginal, which was a 1.5 mm vessel with proximal 90% stenosis.  A reverse saphenous vein was sewn end-to-side with running 7-0 Prolene and there was good flow through the graft.  Cardioplegia was redosed.  The fourth distal anastomosis to distal aspect of the LAD, which was a 1.5 to 1.8 mm vessel with proximal 90% stenosis.  The left internal mammary artery pedicle was brought through an opening created in the left lateral pericardium.  It was brought down on the LAD, and sewn end-to-side with a running 8-0 Prolene.  There was excellent flow through the anastomosis with immediate rise in septal temperature after release of the pedicle clamp on the mammary artery.  The mammary pedicle was secured to the epicardium and the aortic cross-clamp was removed.  The heart resumed a spontaneous rhythm and a partial occluding clamp was placed on the ascending aorta.  Three proximal vein anastomoses were placed using a 4.0 mm punch and running 6-0 Prolene.  The partial clamp was removed and the vein grafts were perfused.  Each had good flow and the hemostasis was documented at the proximal and distal sites.  The patient was rewarmed to 37 rewarmed to 37 degrees and temporary pacing wires were applied.  The lungs were reexpanded and the ventilator was resumed.  The patient weaned from cardiopulmonary bypass without inotropes with stable blood pressure and cardiac output.  Protamine was administered and the cannulae were removed. The mediastinum was irrigated with warm antibiotic irrigation.  The leg incision was irrigated and closed in a standard fashion.  The pericardium  was closed loosely over the aorta and the vein grafts.   Two mediastinal and a left pleural chest tube were placed and brought out through separate  incisions.  A transfusion of platelets were required due to postoperative coagulopathy related to preoperative use of IV Integrilin platelet inhibitor. The sternum was reapproximated with interrupted steel wire and the pectoralis fascia and subcutaneous layers were closed with running Vicryl.  The skin was closed with a subcuticular and sterile dressings were applied.  Total cardiopulmonary bypass time was 135 minutes with an aortic cross-clamp time of 70 minutes. DD:  03/23/00 TD:  03/24/00 Job: [id number]

## 2010-11-21 NOTE — Cardiovascular Report (Signed)
[place]. [facility]  Patient:    Joel [name], Joel [name]                        MRN: [id number] Proc. Date: 01/16/00 Adm. 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  He came to the emergency room today with left chest and shoulder pain associated with diaphoresis.  He was seen by Corine Shelter, M.D., with persistent pain.  His ECG was normal.  We brought him to the catheterization lab urgently for evaluation with angiography.  DESCRIPTION OF PROCEDURE:  The procedure was performed via the right femoral artery using an arterial sheath and 6-French preformed coronary catheters.  A front wall arterial puncture was performed, and Omnipaque contrast was used. After completing the diagnostic study, we made a decision to intervene on the circumflex artery which we thought was the culprit vessel.  The contrast was switched to Hexabrix.  The patient had been given heparin in the emergency room, and the initial ACT was greater than 200 seconds.  We initiated double bolus Integrilin and infusion.  We used a 4.0, 7-French Voda guiding catheter and a short Floppy wire.  We crossed the lesion in the mid circumflex artery without difficulty.  We used a 2.5 x 20-mm Quantum Ranger and crossed the lesion without difficulty.  We performed two inflations up to seven atmospheres for 44 seconds.  Repeat diagnostic studies were then performed through the guiding catheter.  The patient tolerated the procedure well and left the laboratory in satisfactory condition.  RESULTS:  Aortic pressure was 109/65 with a mean of 82.  Left ventricular pressure was 109/20.  1. The left main coronary  artery was free of significant disease. 2. The left anterior descending artery was diffusely irregular and gave rise    to a large septal perforator, two small diagonal branches, and a small    septal perforator.  There was 70% segmental disease in the mid vessel and    70% in the distal vessel. 3. The circumflex artery gave rise to an intermedius branch, a marginal    branch, and two small posterolateral branches.  There was 50% proximal    stenosis in the circumflex artery; there was 95% in the mid right coronary    artery after the marginal branch; there was 70% stenosis in the distal    vessel. 4. The right coronary artery was a moderate sized vessel that was diffusely    diseased that gave rise to two right ventricular branches, a posterior    descending branch which had two sub branches, and two posterolateral    branches.  There was 70% segmental disease from the ostium into the    proximal right coronary artery, 70% and 50% stenosis in the mid vessel,    and 50-70% stenosis in the distal vessel just before the posterior    descending branch which was hypodense.  The left ventriculogram performed in the RAO projection showed good wall motion with no areas of  hypokinesis.  The estimated ejection fraction was 57%.  The left ventriculogram performed in the LAO projection showed slight hypokinesis of the inferolateral wall.  Following PTCA of the mid circumflex lesion, the stenosis improved from 95% to less than 20%.  CONCLUSIONS: 1. Coronary artery disease with 70% mid and 70% distal stenosis in the left    anterior descending artery, 50% proximal, 95% mid and 70% distal stenosis    in the circumflex artery, 70% proximal, 70% mid, and 50-70% distal    stenosis in the right coronary artery with slight inferolateral wall    hypokinesis. 2. Successful PTCA of the mid circumflex artery stenosis with improvement in    percent area narrowing from 95% to less than 20%.  DISPOSITION:   The patient was returned to the post anesthesia unit for further observation.  If his enzymes are negative, we will probably let him go home tomorrow.  If his enzymes are positive, then we will keep him another day.  I would recommend we do an outpatient Cardiolite to evaluate any residual disease. DD:  01/16/00 TD:  01/17/00 Job: [id number]

## 2010-11-21 NOTE — Cardiovascular Report (Signed)
[place]. [facility]  Patient:    Joel [name], Joel [name] Visit Number: [id number] MRN: [id number]          Service Type: CAT Location: [facility] 2899 23 Attending Physician:  Lenoria Farrier Dictated by:   Everardo Beals Juanda Chance, M.D. [facility] Proc. Date: 09/26/01 Admit Date:  09/26/2001 Discharge Date: 09/26/2001   CC:         Roxy Manns, M.D. [facility]  Cardiopulmonary Laboratory   Cardiac Catheterization  PROCEDURES PERFORMED: Cardiac catheterization.  CLINICAL HISTORY: The patient is 76 years old and has had bypass surgery performed in 2001. I saw him recently with symptoms somewhat suggestive of angina, although they were not totally typical. We did a Cardiolite scan which suggested inferior scar with superimposed ischemia and we made a decision to bring him in for evaluation angiography.  DESCRIPTION OF PROCEDURE: The procedure was performed via the right femoral artery using an arterial sheath and 6 French preformed coronary catheters.  A front wall arterial puncture was performed and Omnipaque contrast was used.  A LIMA catheter was used for injection of a LIMA graft. A JR4 catheters were used for injection in the vein grafts. Distal aortogram was performed to rule out abdominal aortic aneurysm. The right femoral artery was closed with Perclose at the end of the procedure. The patient tolerated the procedure well and left the laboratory in satisfactory condition.  RESULTS: The left main coronary artery: The left main coronary artery was free of significant disease.  Left anterior descending: The left anterior descending artery was diffusely irregular with an 80% narrowing in the midportion after two separate perforators. There was competing flow distally.  Circumflex artery: The circumflex artery gave rise to an intermediate branch, a marginal branch, and a posterolateral branch. There was 40% narrowing in the proximal circumflex artery. There was 90% stenosis  in the proximal portion of the marginal branch with competing flow distally.  Right coronary artery: The right coronary is a moderately large vessel that gave rise to a conus branch, two right ventricular branches, a posterior descending, and four posterolateral branches. There was 50% proximal and 70% mid stenosis in the right coronary artery and there was a 95% stenosis just before the first posterolateral branch, and after the posterior descending branch. We were unable to see the takeoff of the posterior descending branch.  The saphenous vein graft to the posterior descending branch and posterolateral branch of the right coronary artery was patent and functioned normally. This fed the first posterolateral branch and then the second, third, and forth posterolateral branches via the AV branch.  The saphenous vein graft to the posterolateral branch of the right coronary artery was completely occluded near its origin.  The saphenous vein graft to the marginal branch of the circumflex artery had 30% narrowing in its proximal portion.  The LIMA graft to the LAD was patent and functioned normally and there was no significant distal disease.  LEFT VENTRICULOGRAPHY: The left ventriculogram performed in the RAO projection showed akinesis of the inferobasal wall.  The overall wall motion was good with an estimated ejection fraction of 60%.  A distal aortogram was performed which showed patent renal arteries. There was marked irregular in the distal aortic and iliac vessels but no major obstruction.  The aortic pressure was 128/78 with a mean of 97. LV pressure was 128/12.  CONCLUSIONS: 1. Coronary artery disease, status post coronary bypass graft surgery in 2001. 2. Severe native vessel coronary disease with 80% narrowing in the  mid    left anterior descending, 40% proximal stenosis in the circumflex    artery with 90% stenosis in the marginal branch, 70% mid and 95%    distal  stenosis in the right coronary artery. 3. Patent vein graft in the posterior descending branch and the posterolateral    branch in the right coronary artery, occluded vein grafts to the    posterolateral branch of the right coronary artery (small vessel),    30% narrowing in the vein graft to the marginal branch in the circumflex    artery, and a patent left internal mammary artery graft to the left    anterior descending. 4. Inferobasal wall akinesis.  RECOMMENDATIONS: The patient has occluded the vein graft to a small posterolateral branch in the circumflex artery and this is a small vessel that appears to be adequately supplied via the vein graft to the posterior descending and first posterolateral branch of the right coronary artery. I see no clear source of ischemia from this study and expect the patients symptoms are probably not cardiac. We will plan to discharge him as an outpatient today and have him followup with Dr. Milinda Antis, who will decide about further evaluation of is symptoms. Dictated by:   Everardo Beals Juanda Chance, M.D. [facility] Attending Physician:  Lenoria Farrier DD:  09/26/01 TD:  09/27/01 Job: [id number]

## 2010-11-21 NOTE — H&P (Signed)
[place]. [facility]  Patient:    Joel [name], Joel [name]                       MRN: [id number] Adm. Date:  01/16/00 Attending:  Everardo Beals. Juanda Chance, M.D. [facility] Dictator:   Abelino Derrick, P.A.C. [facility]                         History and Physical  CHIEF COMPLAINT:   Chest pain.  HISTORY OF PRESENT ILLNESS:  Joel [name] is a 75 year old male seen in the past by Primary Care at [facility].  He has no prior history of coronary disease.  Several years ago, he had a stress test that was unremarkable.  Over the past week, he has had some intermittent left chest pain with radiation into his left arm and left shoulder.  Symptoms were fairly localized, and he describes the pain as "sharp."  He had one episode this morning that was brief, but then recurred at about 2 oclock and worsened with exertion.  He describes significant chest pain with radiation to his both arms and into his left shoulder and associated with diaphoresis.  He presented to the emergency room for further evaluation.  His EKG shows normal sinus rhythm with incomplete right bundle branch block, otherwise no acute changes.  He continues to have some intermittent arm pain.  PAST MEDICAL HISTORY:  Remarkable for reflux symptoms and DJD.  He has had previous neck and back surgery.  There is no history of hypertension or diabetes, and he is unsure of his lipid status.  MEDICATIONS:  He takes no medications regularly at home.  ALLERGIES:  He had no known drug allergies but has a history of ASPIRIN intolerance because of GI symptoms.  SOCIAL HISTORY:  He is married.  He has not worked in five years; he is a former Scientist, water quality.  He smokes a pack and one-half a day.  FAMILY HISTORY:  Unremarkable for coronary disease.  His mother lived to 59; his father died of diabetes in his 51s.  REVIEW OF SYSTEMS:  Essentially unremarkable except as noted above.  There is no history of GI bleeding or peptic ulcer disease.  He  has no melena.  He does have a history of kidney stones and has had lithotripsy in the past.  PHYSICAL EXAMINATION:  VITAL SIGNS:  Blood pressure 130/70, pulse 62 and regular, respirations 18.  GENERAL:  He is a well-developed, well-nourished male in no acute distress.  HEENT:  Normocephalic.  Extraocular movements are intact.  Sclerae nonicteric. Lids and conjunctivae within normal limits.  NECK:  Without JVD, without bruit.  SKIN:  Warm and dry.  CHEST:  Clear to auscultation and percussion.  CARDIAC:  Regular rate and rhythm without murmur, rub, or gallop.   Normal S1, S2.  ABDOMEN:  Nontender.  No hepatosplenomegaly, not distended.  EXTREMITIES:  No edema.  Distal pulses are 2+/4 bilaterally with no femoral artery bruits noted.  NEUROLOGIC:  Grossly intact.  He is awake, alert, oriented, and cooperative. There is no obvious deficit.  RECTAL:  Heme-negative stool.  LABORATORY DATA:  EKG, as noted, shows normal sinus rhythm with incomplete right bundle branch block.  Chest x-ray shows COPD, no congestive failure.  Renal function shows sodium 139, potassium 3.8, BUN 13, creatinine 0.8.  White count 11.9, hemoglobin 15.6, hematocrit 43.9, platelet count 259.  IRN 1.0. CK 177, MB and troponins pending.  IMPRESSION: 1. Chest pain consistent with unstable angina. 2. History of smoking. 3. Degenerative joint disease. 4. Gastroesophageal reflux symptoms.  PLAN:  The patient will be admitted to telemetry.  IV heparin and IV nitrates will be added.  At this point, we will hold off on a beta blocker as his heart rate is already bradycardic, and he has an incomplete right bundle branch block.  He most likely will need cardiac catheterization.  Unfortunately, due to a full lab schedule, this cannot be done today and will need to be scheduled for Monday. DD:  01/16/00 TD:  01/16/00 Job: [id number]

## 2010-11-21 NOTE — Assessment & Plan Note (Signed)
[facility]                            CARDIOLOGY OFFICE NOTE   NAME:Souders, MARQUELLE MUSGRAVE                        MRN:          [id number]  DATE:11/09/2006                            DOB:          06-22-1936    PRIMARY CARE PHYSICIAN:  [employer].   CLINICAL HISTORY:  Mr. Youtsey is 75 years old and had bypass surgery in  2001.  He has done quite well since that time but he says he has had a  moderate amount of chest pain over the last year.  He describes several  types.  He describes sharp shooting pains.  He also says he has some  more constant pain in the substernal area which he says sounds somewhat  like indigestion.  He also has some exertionally related chest pain, but  this is not predictable.  He has had no palpitations or shortness of  breath.   PAST MEDICAL HISTORY:  Significant for:  1. Hyperlipidemia.  2. Hypertension.   SOCIAL HISTORY:  His wife has cancer of the lung and he is spending  quite a bit of time looking after her.   CURRENT MEDICATIONS:  Hydrochlorothiazide, aspirin, Lipitor, Prilosec.   EXAMINATION:  Today, the blood pressure is 134/78 and pulse 58 and  regular.  There was no venous distention.  The carotid pulses were full  without bruits.  CHEST:  Clear.  HEART:  Rhythm was regular.  I could hear no murmurs or gallops.  ABDOMEN:  Soft with normal bowel sounds.  There is no  hepatosplenomegaly.  Peripheral pulses are full and there is no  peripheral edema.   Electrocardiogram showed right bundle branch block and left axis  deviation and nonspecific ST-T changes.  This had not changed from  previous ECGs.   Recent cholesterol panel showed a total cholesterol of 148, an HDL of 40  and an LDL of 91.   IMPRESSION:  1. Coronary artery disease status post coronary bypass graft surgery      in 2001, now stable with patent grafts in 2003 except for occlusion      of the distal end of the posterolateral branch.  2.  Hypertension.  3. Hyperlipidemia.  4. Bifascicular block.   RECOMMENDATIONS:  Mr. Bynum was having several types of pain.  They  are somewhat atypical but he is 7 years post bypass so I will plan to  evaluate him further with an exercise rest  stress Myoview scan.  I will be in touch with him after we have the  results.  If these are negative, I will plan to see him back for cardiac  followup in a year.     Bruce Elvera Lennox Juanda Chance, MD, [facility]  Electronically Signed    BRB/MedQ  DD: 11/09/2006  DT: 11/09/2006  Job #: [id number]

## 2010-11-21 NOTE — Cardiovascular Report (Signed)
[place]  Patient:    Joel [name], Joel [name]                        MRN: [id number] Proc. Date: 03/22/00 Adm. Date:  [id number] Attending:  Daisey Must CC:         Roxy Manns, M.D. [facility]  Bruce R. Juanda Chance, M.D.  [facility]  Cardiac Catheterization Lab   Cardiac Catheterization  PROCEDURE PERFORMED:  Left heart catheterization with coronary angiography, left ventriculography, abdominal aortography.  INDICATIONS:  Mr. Weston Anna is a 75 year old male with a history of previous PTCA of the distal circumflex by Dr. Juanda Chance in July of this year.  He presented with recurrent unstable angina.  PROCEDURAL NOTE:  A 6-French sheath was placed in the right femoral artery. Standard Judkins 6-French catheters were utilized.  Contrast was Omnipaque. At the conclusion of the case, a Perclose vascular closure device was placed in the right femoral artery with good hemostasis.  There were no complications.  RESULTS:  Hemodynamics:  Left ventricular pressure 92/12.  Aortic pressure 94/56.  No aortic valve gradient.  Left ventriculogram:  Wall motion is normal.  Ejection fraction is calculated at 65%.  No mitral regurgitation.  Left subclavian artery and left internal mammary artery are patent.  Abdominal aortogram reveals patent renal arteries.  There is moderate plaque and calcification of the infrarenal abdominal aorta with a 70% calcified shelf-like plaque in the proximal right common iliac artery.  Coronary arteriography (right dominant): Note:  The coronaries are diffusely diseased. 1. The left main has a distal 20% stenosis. 2. The left anterior descending artery has a proximal 30%, followed by a long    75%, stenosis in the mid vessel and a 70% stenosis in the distal vessel.    The LAD gives rise to two small diagonal branches. 3. The left circumflex has a 75% stenosis in the mid vessel.  In the distal    vessel after a large OM-2 is a 75%, followed by an 80%,  stenosis at the    previous PTCA site.  OM-2 is a large vessel with a 50% stenosis.  OM-3 is    relatively small with a 70% stenosis.  The circumflex also gives rise to a    normal branching ramus intermedius. 4. The right coronary artery is a dominant vessel.  It is diffusely diseased    with 50% stenosis proximally, 70% stenosis in the mid, and further down at    the acute margin is a 75% stenosis.  Following this in the distal vessel is    a 60%, followed by a long 40% extending to just proximal to the bifurcation    of the posterior descending artery.  Just prior to the origin of the    posterior descending artery is a 95% stenosis with a filling defect    consistent with probable thrombus.  The posterior descending artery is    actually a duo system with two 90% stenoses in the more lateral branch.    The distal right coronary artery also gives rise to two normal sized    posterolateral branches and a third small posterolateral branch.  IMPRESSIONS: 1. Preserved left ventricular systolic function. 2. Moderate peripheral vascular disease. 3. Three-vessel coronary artery disease as described with diffusely diseased    coronary arteries but reasonable targets for bypass grafting.  PLAN:  The patient will be started on Integrilin due to the thrombus in the distal right coronary artery,  and heparin will be resumed.  Cardiovascular surgery will be consulted for evaluation for coronary artery bypass grafting. DD:  03/22/00 TD:  03/22/00 Job: [id number]

## 2010-11-21 NOTE — Discharge Summary (Signed)
[place]  Patient:    Joel [name], Joel [name]                        MRN: [id number] Adm. Date:  [id number] Disc. Date: [id number] Attending:  Lenoria Farrier Dictator:   Abelino Derrick, P.A.-C, [facility] CC:         [facility] Office                           Discharge Summary  DISCHARGE DIAGNOSES: 1. Subendocardial myocardial infarction this admission treated with    circumflex angioplasty. 2. History of smoking. 3. Gastroesophageal reflux disease history. 4. Hyperlipidemia.  HOSPITAL COURSE:  The patient is a 75 year old male, followed in the past at the [facility] office but not recently.  He has no prior history of coronary disease.  He presented January 16, 2000, with chest pain consistent with unstable angina.  Please see admission history and physical for complete details.  The patient was admitted directly to the catheterization lab for urgent catheterization.  He was started on aspirin, heparin and nitrates in the emergency room with incomplete relief of his symptoms.  Catheterization done by Dr. Juanda Chance revealed a 70% proximal RCA, 70% mid RCA, 50-70% distal RCA, 95% mid circumflex, 70% mid LAD and 70% distal LAD.  His EF was 57%.  The circumflex was dilated with a good final result.  The patients enzymes were positive with a CK of 280 and 30 MBs.  Lipid profile showed an LDL of 105, HDL 31.  The patient was transferred to the PCU and ambulated.  We feel he is stable for discharge late on the 15th.  DISCHARGE MEDICATIONS: 1. Lipitor 10 mg a day. 2. Plavix 75 mg a day for four weeks. 3. Coated aspirin q.d. 4. Toprol XL 25 mg a day. 5. Nitroglycerin sublingual p.r.n.  LABORATORY DATA:  Potassium 3.9, BUN 9, creatinine 1.0, sodium 139.  White count is 10.6, hemoglobin 14, hematocrit 39.7, platelets 184.  INR was 1.0. Liver functions are normal.  ECG reveals normal sinus rhythm, incomplete right bundle-branch block.  DISPOSITION:  The  patient is discharged in stable condition and with follow up in the office in about two weeks.  Dr. Juanda Chance will be his cardiologist. DD: 01/19/00 TD:  01/19/00 Job: [id number]

## 2010-12-04 ENCOUNTER — Encounter: Payer: Self-pay | Admitting: Cardiovascular Disease

## 2010-12-19 ENCOUNTER — Ambulatory Visit (INDEPENDENT_AMBULATORY_CARE_PROVIDER_SITE_OTHER): Payer: Medicare Other | Admitting: Cardiovascular Disease

## 2010-12-19 ENCOUNTER — Encounter: Payer: Self-pay | Admitting: Cardiovascular Disease

## 2010-12-19 VITALS — BP 145/89 | HR 57 | Ht 71.0 in | Wt 184.0 lb

## 2010-12-19 DIAGNOSIS — I2510 Atherosclerotic heart disease of native coronary artery without angina pectoris: Secondary | ICD-10-CM

## 2010-12-19 NOTE — Progress Notes (Signed)
History of Present Illness:  75 yo male with h/o CAD, HTN, HLD here for routine cardiac follow up. He has been followed in the past by Dr. Juanda Chance. He had bypass surgery in 2001. In May of  2010 he had catheterization which showed occlusion of the  saphenous vein graft to the posterior branch. He has done quite well since that time. Lipid profile  January 2011 showed total cholesterol 145, an HDL of 36, and an LDL of 87.  He tells me that he feels well. He has occasionally chest pressure but unchanged from the past year. No change in his breathing. He does have mild dyspnea with exertion. No palpitations. No near syncope or syncope. He is a former smoker but has not smoked since 2001.   Past Medical History  Diagnosis Date  . GERD (gastroesophageal reflux disease)   . Gastritis   . Hyperlipidemia   . History of nephrolithiasis   . Partial tear of rotator cuff     Right  . Hypertension   . Coronary artery disease     Post CABG in 2001 with patent grafts in 2003 except for occlusion of the distal limb of a posterolateral branch  . Bifascicular block   . Unstable angina     Past Surgical History  Procedure Date  . 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 Diffuision of unstable angina 03/2000  . Coronary artery bypass graft 2001  . Back surgery   . Ct abd w & pelvis wo cm 09/2001    Kidney stones  . Nasal sinus surgery 04/2002  . Stress cardiolite 10/2003    No acute changes  . Neck surgery   . Ct abd wo/w & pelvis wo cm 10/14/2007    Low grade obstruction on R by 4mm prox ereteral calculus, Sm bilat renal calculi sm HH  . Pelvic ct 10/14/2007    nml  . Kidney stone surgery 10/2007    Removal R ureteral kidney stone  . Esophagogastroduodenoscopy 08/2008    Mild gastritis and GERD  . Cardiac catheterization 01/2000    Multi vessel PTCA  . Cardiac catheterization 11/2008    multi vesstel dz, re occlusion small vessel    Current Outpatient Prescriptions  Medication Sig Dispense  Refill  . aspirin 325 MG EC tablet Take 325 mg by mouth daily.        Marland Kitchen atorvastatin (LIPITOR) 40 MG tablet Take 40 mg by mouth daily.        . diphenhydramine-acetaminophen (TYLENOL PM EXTRA STRENGTH) 25-500 MG TABS Take 1 tablet by mouth as directed.        . hydrochlorothiazide 50 MG tablet Take 50 mg by mouth daily.        . methocarbamol (ROBAXIN) 500 MG tablet Take 500 mg by mouth every 6 (six) hours as needed. Watch out for sedation.       . Multiple Vitamin (MULTIVITAMIN) tablet Take 1 tablet by mouth daily.        . nitroGLYCERIN (NITROLINGUAL) 0.4 MG/SPRAY spray Place 1 spray under the tongue.        Marland Kitchen omeprazole (PRILOSEC) 20 MG capsule Take 20 mg by mouth daily as needed.          Allergies no known allergies  History   Social History  . Marital Status: Widowed    Spouse Name: N/A    Number of Children: N/A  . Years of Education: N/A   Occupational History  . Retired Market researcher work  Social History Main Topics  . Smoking status: Former Smoker    Quit date: 07/07/1999  . Smokeless tobacco: Not on file  . Alcohol Use: Not on file  . Drug Use: Not on file  . Sexually Active: Not on file   Other Topics Concern  . Not on file   Social History Narrative   1 half brother, 3 half sistersWife died last year of lung cancer2 childrenFishes and walks dog for exerciseSigned Designated Party Release form and gives Leane Para, daughter [phone]. Can leave msg on ans machine    Family History  Problem Relation Age of Onset  . Transient ischemic attack Mother   . Coronary artery disease Mother   . Alcohol abuse Father   . Diabetes type II Father     Review of Systems:  As stated in the HPI and otherwise negative.   BP 145/89  Pulse 57  Ht 5\' 11"  (1.803 m)  Wt 184 lb (83.462 kg)  BMI 25.66 kg/m2  Physical Examination: General: Well developed, well nourished, NAD HEENT: OP clear, mucus membranes moist SKIN: warm, dry. No rashes. Neuro: No focal  deficits Musculoskeletal: Muscle strength 5/5 all ext Psychiatric: Mood and affect normal Neck: No JVD, no carotid bruits, no thyromegaly, no lymphadenopathy. Lungs:Clear bilaterally, no wheezes, rhonci, crackles Cardiovascular: Regular rate and rhythm. No murmurs, gallops or rubs. Abdomen:Soft. Bowel sounds present. Non-tender.  Extremities: No lower extremity edema. Pulses are 2 + in the bilateral DP/PT.  GNF:AOZHY brady, rate 57 bpm. RBBB, LAFB. LVH. Non-specific  T wave changes.

## 2010-12-19 NOTE — Assessment & Plan Note (Signed)
Stable. No changes. He has not been on a beta blocker. I asked him about starting a beta blocker but he refused. He does not wish to start any anti-hypertensives. Continue ASA and statin. Also discussed screening carotid dopplers but he refused.

## 2010-12-30 ENCOUNTER — Other Ambulatory Visit: Payer: Self-pay | Admitting: Family Medicine

## 2010-12-31 ENCOUNTER — Telehealth: Payer: Self-pay | Admitting: *Deleted

## 2010-12-31 NOTE — Telephone Encounter (Signed)
Prior Joel [name] is needed for robaxin, form is on your shelf- will wait for your return to office.

## 2010-12-31 NOTE — Telephone Encounter (Signed)
[facility] request refill for Robaxin. Please advise.

## 2011-01-05 NOTE — Telephone Encounter (Signed)
Let him know that his px company denied the request - they will not pay for med Ask him to call his ins co to see what muscle relaxers they do cover so we can try something else (if he still needs it)

## 2011-01-06 NOTE — Telephone Encounter (Signed)
Thanks for the update - just let me know when refil needed

## 2011-01-06 NOTE — Telephone Encounter (Signed)
Patient says that he is going to pay for the robaxin our of pocket and he says that he can get this reimbursed.

## 2011-01-06 NOTE — Telephone Encounter (Signed)
Left vm for pt to callback 

## 2011-01-10 ENCOUNTER — Encounter: Payer: Self-pay | Admitting: Family Medicine

## 2011-01-13 ENCOUNTER — Other Ambulatory Visit: Payer: Self-pay | Admitting: Family Medicine

## 2011-01-13 DIAGNOSIS — E7800 Pure hypercholesterolemia, unspecified: Secondary | ICD-10-CM

## 2011-01-14 ENCOUNTER — Other Ambulatory Visit (INDEPENDENT_AMBULATORY_CARE_PROVIDER_SITE_OTHER): Payer: Medicare Other | Admitting: Family Medicine

## 2011-01-14 DIAGNOSIS — E7800 Pure hypercholesterolemia, unspecified: Secondary | ICD-10-CM

## 2011-01-14 DIAGNOSIS — E119 Type 2 diabetes mellitus without complications: Secondary | ICD-10-CM

## 2011-01-14 LAB — RENAL FUNCTION PANEL
BUN: 13 mg/dL (ref 6–23)
CO2: 33 mEq/L — ABNORMAL HIGH (ref 19–32)
Calcium: 8.9 mg/dL (ref 8.4–10.5)
Chloride: 107 mEq/L (ref 96–112)
Creatinine, Ser: 0.9 mg/dL (ref 0.4–1.5)
GFR: 91.47 mL/min (ref >60.00)

## 2011-01-14 LAB — LIPID PANEL
LDL Cholesterol: 75 mg/dL (ref 0–99)
Total CHOL/HDL Ratio: 3
Triglycerides: 87 mg/dL (ref 0.0–149.0)
VLDL: 17.4 mg/dL (ref 0.0–40.0)

## 2011-01-21 ENCOUNTER — Ambulatory Visit (INDEPENDENT_AMBULATORY_CARE_PROVIDER_SITE_OTHER): Payer: Medicare Other | Admitting: Family Medicine

## 2011-01-21 ENCOUNTER — Encounter: Payer: Self-pay | Admitting: Family Medicine

## 2011-01-21 DIAGNOSIS — E785 Hyperlipidemia, unspecified: Secondary | ICD-10-CM

## 2011-01-21 DIAGNOSIS — I10 Essential (primary) hypertension: Secondary | ICD-10-CM

## 2011-01-21 DIAGNOSIS — M62838 Other muscle spasm: Secondary | ICD-10-CM | POA: Insufficient documentation

## 2011-01-21 MED ORDER — CYCLOBENZAPRINE HCL 10 MG PO TABS: 10.0000 mg | ORAL_TABLET | Freq: Three times a day (TID) | ORAL | Status: AC | PRN

## 2011-01-21 NOTE — Assessment & Plan Note (Signed)
bp is fair today - stable overall  No change in med Rev labs F/u cardiol as planned

## 2011-01-21 NOTE — Patient Instructions (Addendum)
Try a different muscle relaxer- flexeril - and let me know if that works ok Watch out for sedation Labs look good and stable  Blood pressure is also stable  Stay active and try to eat a heatlhy diet  Schedule fasting labs and then follow up in 6 months

## 2011-01-21 NOTE — Assessment & Plan Note (Signed)
This is well controlled with current med and diet  Disc low sat fat diet Has CAD and no symptoms currently F/u 6 mo with labs

## 2011-01-21 NOTE — Progress Notes (Signed)
Subjective:    Patient ID: Joel [name], male    DOB: 03-Dec-1935, 75 y.o.   MRN: [id number]  HPI Here for f/u of HTN and lipids (has CAD) and also to disc zostavax  He knows his ins will reimburse him for the zoster vaccine - but ? Office or pharmacy  He will call them back today about that -- will let us know    Tried hearing aid about 8 months ago - made it worse with more than one person talking  Also could not handle the sound of the wind  Will wait until he cannot hear at all   Pt thinks he has lost a few lb Not eating as much or as often -- 7 lb in a year  He likes to stay as slim as possible  Lipids stable on statin and diet  Lab Results  Component Value Date   CHOL 135 01/14/2011   CHOL 137 07/15/2010   CHOL 141 01/13/2010   Lab Results  Component Value Date   HDL 43.00 01/14/2011   HDL 69.62 07/15/2010   HDL 95.28* 01/13/2010   Lab Results  Component Value Date   LDLCALC 75 01/14/2011   LDLCALC 80 07/15/2010   LDLCALC 80 01/13/2010   Lab Results  Component Value Date   TRIG 87.0 01/14/2011   TRIG 89.0 07/15/2010   TRIG 118.0 01/13/2010   Lab Results  Component Value Date   CHOLHDL 3 01/14/2011   CHOLHDL 3 07/15/2010   CHOLHDL 4 01/13/2010   No results found for this basename: LDLDIRECT   Is really watching diet - eating less in general  Eats what he wants just less   HTN is in fair control with 138/76 No cp or ha or palpitations or edema   Overall all labs are stable   Has suffered from cramps and muscle spasms for years -- and needs a muscle relaxer  Toes curl up and pain in legs  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 HYPERLIPIDEMIA  . DECREASED HEARING  . ESSENTIAL HYPERTENSION  . CAD  . CAD, AUTOLOGOUS BYPASS GRAFT  . RHINITIS, CHRONIC  . GERD  . URINARY  CALCULUS  . Actinic Keratosis  . BACK PAIN  . FLATULENCE-GAS-BLOATING  . RUQ PAIN  . HEMATOMA  . NEPHROLITHIASIS, HX OF  . Night muscle spasms   Past Medical History  Diagnosis Date  . GERD (gastroesophageal reflux disease)   . Gastritis   . Hyperlipidemia   . History of nephrolithiasis   . Partial tear of rotator cuff     Right  . Hypertension   . Coronary artery disease     Post CABG in 2001 with patent grafts in 2003 except for occlusion of the distal limb of a posterolateral branch  . Bifascicular block   . Unstable angina    Past Surgical History  Procedure Date  . 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 Diffuision of unstable angina 03/2000  . Coronary artery bypass graft 2001  . Back surgery   . Ct abd w & pelvis wo cm 09/2001    Kidney stones  . Nasal sinus surgery 04/2002  . Stress cardiolite 10/2003    No acute changes  . Neck surgery   .  Ct abd wo/w & pelvis wo cm 10/14/2007    Low grade obstruction on R by 4mm prox ereteral calculus, Sm bilat renal calculi sm HH  . Pelvic ct 10/14/2007    nml  . Kidney stone surgery 10/2007    Removal R ureteral kidney stone  . Esophagogastroduodenoscopy 08/2008    Mild gastritis and GERD  . Cardiac catheterization 01/2000    Multi vessel PTCA  . Cardiac catheterization 11/2008    multi vesstel dz, re occlusion small vessel   History  Substance Use Topics  . Smoking status: Former Smoker    Quit date: 07/07/1999  . Smokeless tobacco: Not on file  . Alcohol Use: Not on file   Family History  Problem Relation Age of Onset  . Transient ischemic attack Mother   . Coronary artery disease Mother   . Heart disease Mother     CAD  . Alcohol abuse Father   . Diabetes type II Father   . Diabetes Father   . Heart disease Father     pacemaker   No Known Allergies Current Outpatient Prescriptions on File Prior to Visit  Medication Sig Dispense Refill  . aspirin 325 MG EC tablet Take 325 mg by mouth daily.        Marland Kitchen  atorvastatin (LIPITOR) 40 MG tablet Take 40 mg by mouth daily.        . diphenhydramine-acetaminophen (TYLENOL PM EXTRA STRENGTH) 25-500 MG TABS Take 1 tablet by mouth as directed.        . hydrochlorothiazide 50 MG tablet Take 50 mg by mouth daily.        . Multiple Vitamin (MULTIVITAMIN) tablet Take 1 tablet by mouth daily.        Marland Kitchen omeprazole (PRILOSEC) 20 MG capsule Take 20 mg by mouth daily as needed.        . nitroGLYCERIN (NITROLINGUAL) 0.4 MG/SPRAY spray Place 1-2 sprays under the tongue.             Review of Systems Review of Systems  Constitutional: Negative for fever, appetite change, fatigue and unexpected weight change.  Eyes: Negative for pain and visual disturbance.  Respiratory: Negative for cough and shortness of breath.   Cardiovascular: Negative for cp or sob or palpitations Gastrointestinal: Negative for nausea, diarrhea and constipation.  Genitourinary: Negative for urgency and frequency.  Skin: Negative for pallor.  Neurological: Negative for weakness, light-headedness, numbness and headaches.  Hematological: Negative for adenopathy. Does not bruise/bleed easily.  Psychiatric/Behavioral: Negative for dysphoric mood. The patient is not nervous/anxious.         Objective:   Physical Exam  Constitutional: He appears well-developed and well-nourished. No distress.  HENT:  Head: Normocephalic and atraumatic.  Right Ear: External ear normal.  Left Ear: External ear normal.  Mouth/Throat: Oropharynx is clear and moist.       Very hard of hearing   Eyes: Conjunctivae and EOM are normal. Pupils are equal, round, and reactive to light.  Neck: Normal range of motion. Neck supple. No JVD present. Carotid bruit is not present. No thyromegaly present.  Cardiovascular: Normal rate, regular rhythm, normal heart sounds and intact distal pulses.   Pulmonary/Chest: Effort normal and breath sounds normal. No respiratory distress. He has no wheezes.       Diffusely distant bs    Abdominal: Soft. Bowel sounds are normal. He exhibits no distension and no mass. There is no tenderness.  Musculoskeletal: Normal range of motion. He exhibits no edema and no  tenderness.  Lymphadenopathy:    He has no cervical adenopathy.  Neurological: He is alert. He has normal reflexes. No cranial nerve deficit. Coordination normal.  Skin: Skin is warm. No rash noted. No erythema. No pallor.  Psychiatric: He has a normal mood and affect.          Assessment & Plan:

## 2011-01-21 NOTE — Assessment & Plan Note (Signed)
These are chronic and may have to do with physical activity  Recommended tonic water  Also since ins denied robaxin will try flexeril with caution Update if not imp

## 2011-03-30 ENCOUNTER — Other Ambulatory Visit: Payer: Self-pay | Admitting: *Deleted

## 2011-03-30 MED ORDER — NITROGLYCERIN 0.4 MG/SPRAY TL SOLN: 1.0000 | Status: DC | PRN

## 2011-03-31 LAB — COMPREHENSIVE METABOLIC PANEL
AST: 24
Albumin: 2.9 — ABNORMAL LOW
Calcium: 8.9
Creatinine, Ser: 1.53 — ABNORMAL HIGH
GFR calc Af Amer: 55 — ABNORMAL LOW
GFR calc non Af Amer: 45 — ABNORMAL LOW

## 2011-03-31 LAB — URINALYSIS, ROUTINE W REFLEX MICROSCOPIC
Glucose, UA: NEGATIVE
Leukocytes, UA: NEGATIVE
Protein, ur: NEGATIVE
Specific Gravity, Urine: 1.02
Urobilinogen, UA: 0.2

## 2011-03-31 LAB — CBC
HCT: 46.7
Hemoglobin: 16.3
MCHC: 34.7
MCV: 94.5
MCV: 93
Platelets: 223
Platelets: 187
RDW: 12.7

## 2011-03-31 LAB — POCT I-STAT, CHEM 8
BUN: 19
Calcium, Ion: 1.11 — ABNORMAL LOW
Chloride: 101
Creatinine, Ser: 1.5
Glucose, Bld: 122 — ABNORMAL HIGH
HCT: 48
Hemoglobin: 16.3
Potassium: 3.4 — ABNORMAL LOW
Sodium: 142
TCO2: 30

## 2011-03-31 LAB — URINE MICROSCOPIC-ADD ON

## 2011-03-31 LAB — DIFFERENTIAL
Basophils Absolute: 0
Basophils Relative: 0
Eosinophils Relative: 4
Lymphocytes Relative: 42
Monocytes Absolute: 1.2 — ABNORMAL HIGH
Monocytes Relative: 11

## 2011-06-16 ENCOUNTER — Ambulatory Visit (INDEPENDENT_AMBULATORY_CARE_PROVIDER_SITE_OTHER): Payer: Medicare Other | Admitting: Cardiovascular Disease

## 2011-06-16 ENCOUNTER — Encounter: Payer: Self-pay | Admitting: Cardiovascular Disease

## 2011-06-16 VITALS — BP 170/90 | HR 66 | Ht 71.0 in | Wt 191.0 lb

## 2011-06-16 DIAGNOSIS — E785 Hyperlipidemia, unspecified: Secondary | ICD-10-CM

## 2011-06-16 DIAGNOSIS — I10 Essential (primary) hypertension: Secondary | ICD-10-CM

## 2011-06-16 DIAGNOSIS — I2510 Atherosclerotic heart disease of native coronary artery without angina pectoris: Secondary | ICD-10-CM

## 2011-06-16 NOTE — Patient Instructions (Signed)
Your physician wants you to follow-up in: 6 months  You will receive a reminder letter in the mail two months in advance. If you don't receive a letter, please call our office to schedule the follow-up appointment.  Your physician recommends that you continue on your current medications as directed. Please refer to the Current Medication list given to you today.  

## 2011-06-16 NOTE — Progress Notes (Signed)
History of Present Illness: 75 yo male with h/o CAD, HTN, HLD here for routine cardiac follow up. He has been followed in the past by Dr. Juanda Chance. I met him in June 2012. He had bypass surgery in 2001. In May of 2010 he had a catheterization which showed occlusion of the saphenous vein graft to the posterior branch. He has done quite well since that time.   He tells me that he has been doing well but has been having more left sided chest pains with exertion. This is described as cramping. He also has simultaneous cramps in his arms and legs.  He has cramping in his feet and toes. No change in his breathing. He does have mild dyspnea with exertion. No palpitations. No near syncope or syncope. He is a former smoker but has not smoked since 2001. He has been hunting and fishing.   Lipid profile July 2012 showed total cholesterol 135, an HDL of 43, and an LDL of 75.     Past Medical History  Diagnosis Date  . GERD (gastroesophageal reflux disease)   . Gastritis   . Hyperlipidemia   . History of nephrolithiasis   . Partial tear of rotator cuff     Right  . Hypertension   . Coronary artery disease     Post CABG in 2001 with patent grafts in 2010  (90% mid LAD stenosis with competing flow from LIMA distally, 90% OM stenosis with competing flow from SVG,  90% RCA stenosis with competing flow from SVG, occluded SVG to Posterolateral branch).  . Bifascicular block   . Unstable angina     Past Surgical History  Procedure Date  . 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 Diffuision of unstable angina 03/2000  . Coronary artery bypass graft 2001  . Back surgery   . Ct abd w & pelvis wo cm 09/2001    Kidney stones  . Nasal sinus surgery 04/2002  . Stress cardiolite 10/2003    No acute changes  . Neck surgery   . Ct abd wo/w & pelvis wo cm 10/14/2007    Low grade obstruction on R by 4mm prox ereteral calculus, Sm bilat renal calculi sm HH  . Pelvic ct 10/14/2007    nml  . Kidney stone surgery  10/2007    Removal R ureteral kidney stone  . Esophagogastroduodenoscopy 08/2008    Mild gastritis and GERD  . Cardiac catheterization 01/2000    Multi vessel PTCA  . Cardiac catheterization 11/2008    multi vesstel dz, re occlusion small vessel    Current Outpatient Prescriptions  Medication Sig Dispense Refill  . aspirin 325 MG EC tablet Take 325 mg by mouth daily.        Marland Kitchen atorvastatin (LIPITOR) 40 MG tablet Take 40 mg by mouth daily.        . cyclobenzaprine (FLEXERIL) 10 MG tablet Take 1 tablet (10 mg total) by mouth every 8 (eight) hours as needed for muscle spasms.  30 tablet  3  . diphenhydramine-acetaminophen (TYLENOL PM EXTRA STRENGTH) 25-500 MG TABS Take 1 tablet by mouth as directed.        . hydrochlorothiazide 50 MG tablet Take 50 mg by mouth daily.        . Multiple Vitamin (MULTIVITAMIN) tablet Take 1 tablet by mouth daily.        . nitroGLYCERIN (NITROLINGUAL) 0.4 MG/SPRAY spray Place 1-2 sprays under the tongue every 5 (five) minutes as needed for chest  pain (Can take up to 3 times; Call EMS after the 3rd pill).  12 g  3  . omeprazole (PRILOSEC) 20 MG capsule Take 20 mg by mouth daily as needed.          No Known Allergies  History   Social History  . Marital Status: Widowed    Spouse Name: N/A    Number of Children: N/A  . Years of Education: N/A   Occupational History  . Retired Market researcher work    Social History Main Topics  . Smoking status: Former Smoker    Quit date: 07/07/1999  . Smokeless tobacco: Not on file  . Alcohol Use: Not on file  . Drug Use: Not on file  . Sexually Active: Not on file   Other Topics Concern  . Not on file   Social History Narrative   1 half brother, 3 half sistersWife died last year of lung cancer2 childrenFishes and walks dog for exerciseSigned Designated Party Release form and gives Leane Para, daughter [phone]. Can leave msg on ans machine    Family History  Problem Relation Age of Onset  . Transient ischemic  attack Mother   . Coronary artery disease Mother   . Heart disease Mother     CAD  . Alcohol abuse Father   . Diabetes type II Father   . Diabetes Father   . Heart disease Father     pacemaker    Review of Systems:  As stated in the HPI and otherwise negative.   BP 170/90  Pulse 66  Ht 5\' 11"  (1.803 m)  Wt 191 lb (86.637 kg)  BMI 26.64 kg/m2  Physical Examination: General: Well developed, well nourished, NAD HEENT: OP clear, mucus membranes moist SKIN: warm, dry. No rashes. Neuro: No focal deficits Musculoskeletal: Muscle strength 5/5 all ext Psychiatric: Mood and affect normal Neck: No JVD, no carotid bruits, no thyromegaly, no lymphadenopathy. Lungs:Clear bilaterally, no wheezes, rhonci, crackles Cardiovascular: Regular rate and rhythm. No murmurs, gallops or rubs. Abdomen:Soft. Bowel sounds present. Non-tender.  Extremities: No lower extremity edema. Pulses are 2 + in the bilateral DP/PT.

## 2011-06-16 NOTE — Assessment & Plan Note (Signed)
BP is elevated today. He does not wish to add any new meds. He will follow his BP at home and let us know if it remains elevated.

## 2011-06-16 NOTE — Assessment & Plan Note (Addendum)
Stable. I do not think his CP is cardiac. If his chest pain worsens, will arrange stress test.

## 2011-06-16 NOTE — Assessment & Plan Note (Signed)
Lipids are well controlled. No changes. 

## 2011-07-21 ENCOUNTER — Other Ambulatory Visit (INDEPENDENT_AMBULATORY_CARE_PROVIDER_SITE_OTHER): Payer: Medicare Other

## 2011-07-21 DIAGNOSIS — I10 Essential (primary) hypertension: Secondary | ICD-10-CM

## 2011-07-21 DIAGNOSIS — E785 Hyperlipidemia, unspecified: Secondary | ICD-10-CM | POA: Diagnosis not present

## 2011-07-21 LAB — COMPREHENSIVE METABOLIC PANEL
ALT: 22 U/L (ref 0–53)
BUN: 15 mg/dL (ref 6–23)
CO2: 29 mEq/L (ref 19–32)
Calcium: 9 mg/dL (ref 8.4–10.5)
Chloride: 103 mEq/L (ref 96–112)
Creatinine, Ser: 1.1 mg/dL (ref 0.4–1.5)
GFR: 68.96 mL/min (ref >60.00)

## 2011-07-21 LAB — LIPID PANEL
Total CHOL/HDL Ratio: 3
Triglycerides: 115 mg/dL (ref 0.0–149.0)

## 2011-07-29 ENCOUNTER — Other Ambulatory Visit: Payer: Self-pay | Admitting: Family Medicine

## 2011-07-30 ENCOUNTER — Other Ambulatory Visit: Payer: Self-pay | Admitting: *Deleted

## 2011-07-30 MED ORDER — HYDROCHLOROTHIAZIDE 50 MG PO TABS: 50.0000 mg | ORAL_TABLET | Freq: Every day | ORAL | Status: DC

## 2011-07-30 MED ORDER — ATORVASTATIN CALCIUM 40 MG PO TABS: 40.0000 mg | ORAL_TABLET | Freq: Every day | ORAL | Status: DC

## 2011-07-31 ENCOUNTER — Ambulatory Visit (INDEPENDENT_AMBULATORY_CARE_PROVIDER_SITE_OTHER): Payer: Medicare Other | Admitting: Family Medicine

## 2011-07-31 ENCOUNTER — Encounter: Payer: Self-pay | Admitting: Family Medicine

## 2011-07-31 VITALS — BP 142/81 | HR 60 | Temp 98.2°F | Ht 71.0 in | Wt 192.5 lb

## 2011-07-31 DIAGNOSIS — I10 Essential (primary) hypertension: Secondary | ICD-10-CM

## 2011-07-31 DIAGNOSIS — R739 Hyperglycemia, unspecified: Secondary | ICD-10-CM

## 2011-07-31 DIAGNOSIS — R7303 Prediabetes: Secondary | ICD-10-CM | POA: Insufficient documentation

## 2011-07-31 DIAGNOSIS — R7309 Other abnormal glucose: Secondary | ICD-10-CM

## 2011-07-31 DIAGNOSIS — E785 Hyperlipidemia, unspecified: Secondary | ICD-10-CM

## 2011-07-31 MED ORDER — HYDROCHLOROTHIAZIDE 50 MG PO TABS: 50.0000 mg | ORAL_TABLET | Freq: Every day | ORAL | Status: DC

## 2011-07-31 MED ORDER — ATORVASTATIN CALCIUM 40 MG PO TABS: 40.0000 mg | ORAL_TABLET | Freq: Every day | ORAL | Status: DC

## 2011-07-31 NOTE — Progress Notes (Signed)
Subjective:    Patient ID: Joel [name], male    DOB: 1935-12-08, 76 y.o.   MRN: [id number]  HPI heref for f/u of HTN and lipids , also glucose was elevated today on labs Feels ok - nothing new going on   bp is 148/84    Today No cp or palpitations or headaches or edema  No side effects to medicines   Was up a bit at cardiology Home bp-- doctor asked him to check it -- and he did not want to   "I'm not worried about it "  Declined any inc in med  Is less active due to muscle cramps occasionally  Lipids- diet and lipitor Lab Results  Component Value Date   CHOL 143 07/21/2011   CHOL 135 01/14/2011   CHOL 137 07/15/2010   Lab Results  Component Value Date   HDL 44.10 07/21/2011   HDL 40.98 01/14/2011   HDL 11.91 07/15/2010   Lab Results  Component Value Date   LDLCALC 76 07/21/2011   LDLCALC 75 01/14/2011   LDLCALC 80 07/15/2010   Lab Results  Component Value Date   TRIG 115.0 07/21/2011   TRIG 87.0 01/14/2011   TRIG 89.0 07/15/2010   Lab Results  Component Value Date   CHOLHDL 3 07/21/2011   CHOLHDL 3 01/14/2011   CHOLHDL 3 07/15/2010   No results found for this basename: LDLDIRECT   eating more fast foods this time of year  [place] and very busy with that  Is not interested in watching his diet   Wt is up 1 lb with bmi of 26  Cardiologist addressed his ongoing chest pain - that has not changed  Does keep the nitro on hand - does not use it   Glucose mildly elevated this labs Does eat a fair amt of sugar Disc risk of DM with father's hx  He has no polydipsia/uria / numbness or vision change  Agreed to decrease sugar in his diet   Patient Active Problem List  Diagnoses  . HYPERLIPIDEMIA  . DECREASED HEARING  . CAD  . CAD, AUTOLOGOUS BYPASS GRAFT  . RHINITIS, CHRONIC  . GERD  . URINARY CALCULUS  . Actinic Keratosis  . BACK PAIN  . FLATULENCE-GAS-BLOATING  . RUQ PAIN  . HEMATOMA  . NEPHROLITHIASIS, HX OF  . Night muscle spasms  . HTN (hypertension)  .  Hyperglycemia   Past Medical History  Diagnosis Date  . GERD (gastroesophageal reflux disease)   . Gastritis   . Hyperlipidemia   . History of nephrolithiasis   . Partial tear of rotator cuff     Right  . Hypertension   . Coronary artery disease     Post CABG in 2001 with patent grafts in 2010  (90% mid LAD stenosis with competing flow from LIMA distally, 90% OM stenosis with competing flow from SVG,  90% RCA stenosis with competing flow from SVG, occluded SVG to Posterolateral branch).  . Bifascicular block   . Unstable angina    Past Surgical History  Procedure Date  . 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 Diffuision of unstable angina 03/2000  . Coronary artery bypass graft 2001  . Back surgery   . Ct abd w & pelvis wo cm 09/2001    Kidney stones  . Nasal sinus surgery 04/2002  . Stress cardiolite 10/2003    No acute changes  . Neck surgery   . Ct abd wo/w & pelvis wo cm 10/14/2007  Low grade obstruction on R by 4mm prox ereteral calculus, Sm bilat renal calculi sm HH  . Pelvic ct 10/14/2007    nml  . Kidney stone surgery 10/2007    Removal R ureteral kidney stone  . Esophagogastroduodenoscopy 08/2008    Mild gastritis and GERD  . Cardiac catheterization 01/2000    Multi vessel PTCA  . Cardiac catheterization 11/2008    multi vesstel dz, re occlusion small vessel   History  Substance Use Topics  . Smoking status: Former Smoker    Quit date: 07/07/1999  . Smokeless tobacco: Not on file  . Alcohol Use: Not on file   Family History  Problem Relation Age of Onset  . Transient ischemic attack Mother   . Coronary artery disease Mother   . Heart disease Mother     CAD  . Alcohol abuse Father   . Diabetes type II Father   . Diabetes Father   . Heart disease Father     pacemaker   No Known Allergies Current Outpatient Prescriptions on File Prior to Visit  Medication Sig Dispense Refill  . aspirin 325 MG EC tablet Take 325 mg by mouth daily.        .  Multiple Vitamin (MULTIVITAMIN) tablet Take 1 tablet by mouth daily.        Marland Kitchen omeprazole (PRILOSEC) 20 MG capsule Take 20 mg by mouth daily as needed.        . cyclobenzaprine (FLEXERIL) 10 MG tablet Take 1 tablet (10 mg total) by mouth every 8 (eight) hours as needed for muscle spasms.  30 tablet  3  . diphenhydramine-acetaminophen (TYLENOL PM EXTRA STRENGTH) 25-500 MG TABS Take 1 tablet by mouth as directed.        . nitroGLYCERIN (NITROLINGUAL) 0.4 MG/SPRAY spray Place 1-2 sprays under the tongue every 5 (five) minutes as needed for chest pain (Can take up to 3 times; Call EMS after the 3rd pill).  12 g  3                Review of Systems Review of Systems  Constitutional: Negative for fever, appetite change, fatigue and unexpected weight change.  Eyes: Negative for pain and visual disturbance.  ENt : pos for hearing loss without ear pain  Respiratory: Negative for cough and shortness of breath.   Cardiovascular: Negative for cp or palpitations    Gastrointestinal: Negative for nausea, diarrhea and constipation.  Genitourinary: Negative for urgency and frequency.  Skin: Negative for pallor or rash   Neurological: Negative for weakness, light-headedness, numbness and headaches.  Hematological: Negative for adenopathy. Does not bruise/bleed easily.  Psychiatric/Behavioral: Negative for dysphoric mood. The patient is not nervous/anxious.          Objective:   Physical Exam  Constitutional: He appears well-developed and well-nourished. No distress.       Very HOH - difficult to communicate with at times  HENT:  Head: Normocephalic and atraumatic.  Mouth/Throat: Oropharynx is clear and moist.  Eyes: Conjunctivae and EOM are normal. Pupils are equal, round, and reactive to light. No scleral icterus.  Neck: Normal range of motion. Neck supple. No JVD present. Carotid bruit is not present. No thyromegaly present.  Cardiovascular: Normal rate, regular rhythm, normal heart sounds and  intact distal pulses.  Exam reveals no gallop.   Pulmonary/Chest: Effort normal and breath sounds normal. No respiratory distress. He has no wheezes.  Abdominal: Soft. Bowel sounds are normal. He exhibits no distension, no abdominal bruit and  no mass. There is no tenderness.  Musculoskeletal: Normal range of motion. He exhibits no edema and no tenderness.       No leg or calf tenderness or swelling   Lymphadenopathy:    He has no cervical adenopathy.  Neurological: He is alert. He has normal reflexes. No cranial nerve deficit. He exhibits normal muscle tone. Coordination normal.  Skin: Skin is warm and dry. No rash noted. No erythema. No pallor.  Psychiatric:       Very HOH and difficult to communicate  Pt seems somewhat apathetic about health -- states at his age is not interested in lifestyle change and not interested in status of his bp etc  However does not seem outwardly depressed           Assessment & Plan:

## 2011-07-31 NOTE — Patient Instructions (Signed)
Your blood sugar is mildly elevated -please watch sugar (sweets and sweet drinks) -- and keep portions of  Starches small  (potato/ bread/ pasta/ rice/ fruit) Exercise helps too  Cholesterol looks pretty good  Check blood pressure occasionally at home when relaxed and let me know how they are  Follow up with me in 6 months with labs prior

## 2011-07-31 NOTE — Assessment & Plan Note (Signed)
bp was better on 2nd check today Pt not concerned and declines more medication He said he would think about checking at home and f/u with cardiol for routine visits bp in fair control at this time  No changes needed  Disc lifstyle change with low sodium diet and exercise

## 2011-07-31 NOTE — Assessment & Plan Note (Signed)
Lipids are well controlled with lipitor  Pt has hx of cramping - he doubts this is from med and does not want to stop it or have a trial off of He continues to eat what he wants - likes fast food (not motivated to change) Disc goals for lipids and reasons to control them Rev labs with pt Rev low sat fat diet in detail

## 2011-07-31 NOTE — Assessment & Plan Note (Signed)
Disc mildly high glucose - pt has fam hx of DM in father  Rev diet- he will watch sugar in the diet more closely and at next visit check a1c Also - agreeable to loose some wt We rev low glycemic diet today

## 2011-10-01 DIAGNOSIS — N400 Enlarged prostate without lower urinary tract symptoms: Secondary | ICD-10-CM | POA: Diagnosis not present

## 2011-10-01 DIAGNOSIS — Z0000 Encounter for general adult medical examination without abnormal findings: Secondary | ICD-10-CM | POA: Diagnosis not present

## 2011-10-09 DIAGNOSIS — Z87442 Personal history of urinary calculi: Secondary | ICD-10-CM | POA: Diagnosis not present

## 2011-10-09 DIAGNOSIS — N400 Enlarged prostate without lower urinary tract symptoms: Secondary | ICD-10-CM | POA: Diagnosis not present

## 2011-11-25 ENCOUNTER — Other Ambulatory Visit: Payer: Self-pay | Admitting: Family Medicine

## 2011-12-17 DIAGNOSIS — M25469 Effusion, unspecified knee: Secondary | ICD-10-CM | POA: Diagnosis not present

## 2011-12-17 DIAGNOSIS — M25569 Pain in unspecified knee: Secondary | ICD-10-CM | POA: Diagnosis not present

## 2012-01-21 ENCOUNTER — Other Ambulatory Visit (INDEPENDENT_AMBULATORY_CARE_PROVIDER_SITE_OTHER): Payer: Medicare Other

## 2012-01-21 DIAGNOSIS — R739 Hyperglycemia, unspecified: Secondary | ICD-10-CM

## 2012-01-21 DIAGNOSIS — E785 Hyperlipidemia, unspecified: Secondary | ICD-10-CM

## 2012-01-21 DIAGNOSIS — R7309 Other abnormal glucose: Secondary | ICD-10-CM | POA: Diagnosis not present

## 2012-01-21 DIAGNOSIS — I10 Essential (primary) hypertension: Secondary | ICD-10-CM | POA: Diagnosis not present

## 2012-01-21 LAB — COMPREHENSIVE METABOLIC PANEL
ALT: 24 U/L (ref 0–53)
Albumin: 4.2 g/dL (ref 3.5–5.2)
CO2: 29 mEq/L (ref 19–32)
Calcium: 9 mg/dL (ref 8.4–10.5)
Chloride: 102 mEq/L (ref 96–112)
Creatinine, Ser: 1 mg/dL (ref 0.4–1.5)
GFR: 75.92 mL/min (ref >60.00)
Potassium: 3.8 mEq/L (ref 3.5–5.1)
Total Protein: 6.8 g/dL (ref 6.0–8.3)

## 2012-01-21 LAB — LIPID PANEL
Total CHOL/HDL Ratio: 3
Triglycerides: 153 mg/dL — ABNORMAL HIGH (ref 0.0–149.0)

## 2012-01-29 ENCOUNTER — Ambulatory Visit (INDEPENDENT_AMBULATORY_CARE_PROVIDER_SITE_OTHER): Payer: Medicare Other | Admitting: Family Medicine

## 2012-01-29 ENCOUNTER — Encounter: Payer: Self-pay | Admitting: Family Medicine

## 2012-01-29 VITALS — BP 136/78 | HR 62 | Temp 97.8°F | Ht 71.0 in | Wt 187.2 lb

## 2012-01-29 DIAGNOSIS — I10 Essential (primary) hypertension: Secondary | ICD-10-CM | POA: Diagnosis not present

## 2012-01-29 DIAGNOSIS — E785 Hyperlipidemia, unspecified: Secondary | ICD-10-CM | POA: Diagnosis not present

## 2012-01-29 DIAGNOSIS — R7309 Other abnormal glucose: Secondary | ICD-10-CM

## 2012-01-29 DIAGNOSIS — R739 Hyperglycemia, unspecified: Secondary | ICD-10-CM

## 2012-01-29 NOTE — Assessment & Plan Note (Signed)
bp in fair control at this time  No changes needed  Disc lifstyle change with low sodium diet and exercise   Labs today reviewed  F/u 6 mo

## 2012-01-29 NOTE — Assessment & Plan Note (Signed)
Has a1c 6.1 Disc limiting simple sugars from diet to avoid DM Pt voiced understanding but not motivated for change

## 2012-01-29 NOTE — Progress Notes (Signed)
Subjective:    Patient ID: Joel [name], male    DOB: [birth year], 76 y.o.   MRN: [id number]  HPI  Here for f/u of chronic conditions  Is doing about the same  Nothing new medically   Wt is down 5 lb with bmi of 26 Keeping busy in garden and busy   Hyperglycemic a1c is 6.1 Diet- is watching it some , but eats "reasonable" amount of sugars   bp is improved     Today BP Readings from Last 3 Encounters:  01/29/12 136/78  07/31/11 142/81  06/16/11 170/90    No cp or palpitations or headaches or edema  No side effects to medicines     On statin and diet Lab Results  Component Value Date   CHOL 148 01/21/2012   CHOL 143 07/21/2011   CHOL 135 01/14/2011   Lab Results  Component Value Date   HDL 44.00 01/21/2012   HDL 40.98 07/21/2011   HDL 11.91 01/14/2011   Lab Results  Component Value Date   LDLCALC 73 01/21/2012   LDLCALC 76 07/21/2011   LDLCALC 75 01/14/2011   Lab Results  Component Value Date   TRIG 153.0* 01/21/2012   TRIG 115.0 07/21/2011   TRIG 87.0 01/14/2011   Lab Results  Component Value Date   CHOLHDL 3 01/21/2012   CHOLHDL 3 07/21/2011   CHOLHDL 3 01/14/2011   No results found for this basename: LDLDIRECT   no real  Change in this overall   Patient Active Problem List  Diagnosis  . HYPERLIPIDEMIA  . DECREASED HEARING  . CAD  . CAD, AUTOLOGOUS BYPASS GRAFT  . RHINITIS, CHRONIC  . GERD  . URINARY CALCULUS  . Actinic Keratosis  . BACK PAIN  . FLATULENCE-GAS-BLOATING  . RUQ PAIN  . HEMATOMA  . NEPHROLITHIASIS, HX OF  . Night muscle spasms  . HTN (hypertension)  . Hyperglycemia   Past Medical History  Diagnosis Date  . GERD (gastroesophageal reflux disease)   . Gastritis   . Hyperlipidemia   . History of nephrolithiasis   . Partial tear of rotator cuff     Right  . Hypertension   . Coronary artery disease     Post CABG in 2001 with patent grafts in 2010  (90% mid LAD stenosis with competing flow from LIMA distally, 90% OM stenosis with  competing flow from SVG,  90% RCA stenosis with competing flow from SVG, occluded SVG to Posterolateral branch).  . Bifascicular block   . Unstable angina    Past Surgical History  Procedure Date  . 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 Diffuision of unstable angina 03/2000  . Coronary artery bypass graft 2001  . Back surgery   . Ct abd w & pelvis wo cm 09/2001    Kidney stones  . Nasal sinus surgery 04/2002  . Stress cardiolite 10/2003    No acute changes  . Neck surgery   . Ct abd wo/w & pelvis wo cm 10/14/2007    Low grade obstruction on R by 4mm prox ereteral calculus, Sm bilat renal calculi sm HH  . Pelvic ct 10/14/2007    nml  . Kidney stone surgery 10/2007    Removal R ureteral kidney stone  . Esophagogastroduodenoscopy 08/2008    Mild gastritis and GERD  . Cardiac catheterization 01/2000    Multi vessel PTCA  . Cardiac catheterization 11/2008    multi vesstel dz, re occlusion small vessel   History  Substance  Use Topics  . Smoking status: Former Smoker    Quit date: 07/07/1999  . Smokeless tobacco: Not on file  . Alcohol Use: No   Family History  Problem Relation Age of Onset  . Transient ischemic attack Mother   . Coronary artery disease Mother   . Heart disease Mother     CAD  . Alcohol abuse Father   . Diabetes type II Father   . Diabetes Father   . Heart disease Father     pacemaker   No Known Allergies Current Outpatient Prescriptions on File Prior to Visit  Medication Sig Dispense Refill  . aspirin 325 MG EC tablet Take 325 mg by mouth daily.        Marland Kitchen atorvastatin (LIPITOR) 40 MG tablet Take 1 tablet (40 mg total) by mouth daily.  30 tablet  11  . atorvastatin (LIPITOR) 40 MG tablet TAKE 1 TABLET (40 MG TOTAL) BY MOUTH DAILY.  30 tablet  11  . diphenhydramine-acetaminophen (TYLENOL PM EXTRA STRENGTH) 25-500 MG TABS Take 1 tablet by mouth as directed.        . hydrochlorothiazide (HYDRODIURIL) 50 MG tablet Take 1 tablet (50 mg total) by mouth daily.   30 tablet  11  . hydrochlorothiazide (HYDRODIURIL) 50 MG tablet TAKE 1 TABLET BY MOUTH DAILY.  30 tablet  11  . Multiple Vitamin (MULTIVITAMIN) tablet Take 1 tablet by mouth daily.        . nitroGLYCERIN (NITROLINGUAL) 0.4 MG/SPRAY spray Place 1-2 sprays under the tongue every 5 (five) minutes as needed for chest pain (Can take up to 3 times; Call EMS after the 3rd pill).  12 g  3  . omeprazole (PRILOSEC) 20 MG capsule Take 20 mg by mouth daily as needed.          Review of Systems Review of Systems  Constitutional: Negative for fever, appetite change, fatigue and unexpected weight change.  Eyes: Negative for pain and visual disturbance.  Respiratory: Negative for cough and shortness of breath.   Cardiovascular: Negative for cp or palpitations    Gastrointestinal: Negative for nausea, diarrhea and constipation.  Genitourinary: Negative for urgency and frequency.  Skin: Negative for pallor or rash   Neurological: Negative for weakness, light-headedness, numbness and headaches.  Hematological: Negative for adenopathy. Does not bruise/bleed easily.  Psychiatric/Behavioral: Negative for dysphoric mood. The patient is not nervous/anxious.         Objective:   Physical Exam  Constitutional: He appears well-developed and well-nourished. No distress.  HENT:  Head: Normocephalic and atraumatic.  Mouth/Throat: Oropharynx is clear and moist.  Eyes: Conjunctivae and EOM are normal. Pupils are equal, round, and reactive to light. No scleral icterus.  Neck: Normal range of motion. Neck supple. No JVD present. Carotid bruit is not present. No thyromegaly present.  Cardiovascular: Normal rate, regular rhythm, normal heart sounds and intact distal pulses.  Exam reveals no gallop.   Pulmonary/Chest: Effort normal and breath sounds normal. No respiratory distress. He has no wheezes.  Abdominal: Soft. Bowel sounds are normal. He exhibits no distension, no abdominal bruit and no mass. There is no  tenderness.  Musculoskeletal: He exhibits no edema.  Lymphadenopathy:    He has no cervical adenopathy.  Neurological: He is alert. He has normal reflexes. No cranial nerve deficit. He exhibits normal muscle tone. Coordination normal.  Skin: Skin is warm and dry. No rash noted. No erythema. No pallor.  Psychiatric: He has a normal mood and affect.  Assessment & Plan:

## 2012-01-29 NOTE — Patient Instructions (Addendum)
Labs overall look stable  You have borderline diabetes (hyperglycemia) so please try to cut sugars and starches as much as you can Stay active  No change in medicines Follow up in 6 months for annual exam with labs prior  Take care of yourself

## 2012-01-29 NOTE — Assessment & Plan Note (Signed)
Stable lipids with statin and diet  Diet fair - pt is candid that he will continue to eat fried foods  Disc goals for lipids and reasons to control them Rev labs with pt Rev low sat fat diet in detail  F/u 6 mo

## 2012-03-29 DIAGNOSIS — Z23 Encounter for immunization: Secondary | ICD-10-CM | POA: Diagnosis not present

## 2012-05-24 ENCOUNTER — Other Ambulatory Visit: Payer: Self-pay | Admitting: Family Medicine

## 2012-07-03 DIAGNOSIS — J019 Acute sinusitis, unspecified: Secondary | ICD-10-CM | POA: Diagnosis not present

## 2012-10-04 DIAGNOSIS — N400 Enlarged prostate without lower urinary tract symptoms: Secondary | ICD-10-CM | POA: Diagnosis not present

## 2012-10-04 DIAGNOSIS — Z0000 Encounter for general adult medical examination without abnormal findings: Secondary | ICD-10-CM | POA: Diagnosis not present

## 2012-10-11 DIAGNOSIS — N200 Calculus of kidney: Secondary | ICD-10-CM | POA: Diagnosis not present

## 2012-10-27 ENCOUNTER — Telehealth: Payer: Self-pay | Admitting: Family Medicine

## 2012-10-27 NOTE — Telephone Encounter (Signed)
Please go ahead and just make a 30 min slot for PE in may with lab prior-thanks

## 2012-10-27 NOTE — Telephone Encounter (Signed)
Patient called to schedule his follow up appointment.  He was last seen on 01/29/12.  In your note it said you wanted him to come in for his annual exam in 6 months.  The appointment wasn't scheduled and your next physical appointment is in September.  I scheduled him for a physical on 03/07/13 and lab work the week before.  I scheduled him for a follow up appointment with you on 11/01/12 since it's been so long since you've seen him.  He wants to know if you want lab work done before the follow up appointment.

## 2012-10-28 NOTE — Telephone Encounter (Signed)
Make it for 30 minutes so we can discuss his issues- do not label it as a physical or annual exam-thanks

## 2012-10-28 NOTE — Telephone Encounter (Signed)
I called patient to let him know we could schedule a physical in May.  Patient said he doesn't want a physical.  He said that he has told you about 4-5 things that are wrong with him and you can't help him, so he doesn't know why he needs a physical.  He said he just wants a follow up appointment to get his medications refilled.  I scheduled him on 11/08/12 at 8:00 for a follow up appointment and I told him to come fasting in case you want lab work done.  Do you want me to make the appointment for 15 or 30 minutes?

## 2012-11-01 ENCOUNTER — Ambulatory Visit: Payer: Medicare Other | Admitting: Family Medicine

## 2012-11-08 ENCOUNTER — Ambulatory Visit: Payer: Medicare Other | Admitting: Family Medicine

## 2012-11-11 ENCOUNTER — Ambulatory Visit (INDEPENDENT_AMBULATORY_CARE_PROVIDER_SITE_OTHER): Payer: Medicare Other | Admitting: Family Medicine

## 2012-11-11 ENCOUNTER — Encounter: Payer: Self-pay | Admitting: Family Medicine

## 2012-11-11 VITALS — BP 132/80 | HR 55 | Temp 98.8°F | Ht 71.0 in | Wt 188.5 lb

## 2012-11-11 DIAGNOSIS — E785 Hyperlipidemia, unspecified: Secondary | ICD-10-CM

## 2012-11-11 DIAGNOSIS — Z23 Encounter for immunization: Secondary | ICD-10-CM

## 2012-11-11 DIAGNOSIS — S41109A Unspecified open wound of unspecified upper arm, initial encounter: Secondary | ICD-10-CM

## 2012-11-11 DIAGNOSIS — R7309 Other abnormal glucose: Secondary | ICD-10-CM | POA: Diagnosis not present

## 2012-11-11 DIAGNOSIS — I10 Essential (primary) hypertension: Secondary | ICD-10-CM | POA: Diagnosis not present

## 2012-11-11 DIAGNOSIS — S41119A Laceration without foreign body of unspecified upper arm, initial encounter: Secondary | ICD-10-CM | POA: Insufficient documentation

## 2012-11-11 DIAGNOSIS — R739 Hyperglycemia, unspecified: Secondary | ICD-10-CM

## 2012-11-11 DIAGNOSIS — S41111A Laceration without foreign body of right upper arm, initial encounter: Secondary | ICD-10-CM

## 2012-11-11 LAB — CBC WITH DIFFERENTIAL/PLATELET
Basophils Relative: 0.3 % (ref 0.0–3.0)
Eosinophils Relative: 5.3 % — ABNORMAL HIGH (ref 0.0–5.0)
HCT: 46 % (ref 39.0–52.0)
Hemoglobin: 15.9 g/dL (ref 13.0–17.0)
Lymphocytes Relative: 32.5 % (ref 12.0–46.0)
Lymphs Abs: 2.5 10*3/uL (ref 0.7–4.0)
Monocytes Relative: 12.1 % — ABNORMAL HIGH (ref 3.0–12.0)
Neutro Abs: 3.8 10*3/uL (ref 1.4–7.7)
RBC: 4.95 Mil/uL (ref 4.22–5.81)
WBC: 7.7 10*3/uL (ref 4.5–10.5)

## 2012-11-11 LAB — HEMOGLOBIN A1C: Hgb A1c MFr Bld: 6 % (ref 4.6–6.5)

## 2012-11-11 LAB — COMPREHENSIVE METABOLIC PANEL
CO2: 26 mEq/L (ref 19–32)
Calcium: 8.7 mg/dL (ref 8.4–10.5)
Chloride: 104 mEq/L (ref 96–112)
Creatinine, Ser: 1 mg/dL (ref 0.4–1.5)
GFR: 82.23 mL/min (ref >60.00)
Glucose, Bld: 95 mg/dL (ref 70–99)
Total Bilirubin: 1.8 mg/dL — ABNORMAL HIGH (ref 0.3–1.2)
Total Protein: 6.4 g/dL (ref 6.0–8.3)

## 2012-11-11 LAB — TSH: TSH: 1.16 u[IU]/mL (ref 0.35–5.50)

## 2012-11-11 LAB — LIPID PANEL
HDL: 41 mg/dL (ref >39.00)
Triglycerides: 70 mg/dL (ref 0.0–149.0)

## 2012-11-11 MED ORDER — ATORVASTATIN CALCIUM 40 MG PO TABS: 40.0000 mg | ORAL_TABLET | Freq: Every day | ORAL | Status: DC

## 2012-11-11 MED ORDER — NITROGLYCERIN 0.4 MG/SPRAY TL SOLN: 2.0000 | Status: DC | PRN

## 2012-11-11 MED ORDER — OMEPRAZOLE 20 MG PO CPDR: 20.0000 mg | DELAYED_RELEASE_CAPSULE | Freq: Every day | ORAL | Status: DC | PRN

## 2012-11-11 MED ORDER — HYDROCHLOROTHIAZIDE 50 MG PO TABS: 50.0000 mg | ORAL_TABLET | Freq: Every day | ORAL | Status: DC

## 2012-11-11 NOTE — Assessment & Plan Note (Signed)
a1c today- disc imp of low glycemic diet Urged to stay active

## 2012-11-11 NOTE — Assessment & Plan Note (Signed)
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

## 2012-11-11 NOTE — Patient Instructions (Addendum)
Lab today  Take care of yourself ! Keep your wound clean with antibacterial soap and water (that is better than using peroxide) I sent your medicines to the pharmacy  Follow up in 6 months

## 2012-11-11 NOTE — Progress Notes (Signed)
Subjective:    Patient ID: Joel [name], male    DOB: 1935-11-05, 77 y.o.   MRN: [id number]  HPI Here for f/u of chronic medical problems   Wt is up 1 lb wth bmi of 26  Sinuses continue to be a problem- esp with the pollen this year -- occ uses tylenol pain and sinus relief  This is chronic  Spends a lot of time outside -keeps him very busy   bp is stable today  No cp or palpitations or headaches or edema  No side effects to medicines  BP Readings from Last 3 Encounters:  11/11/12 132/80  01/29/12 136/78  07/31/11 142/81    bp is very good today   Hyperlipidemia Statin and diet   Hyperglycemia Lab Results  Component Value Date   HGBA1C 6.1 01/21/2012   eats the same -no changes Watches the sugars  Has skin tear on right forearm from working outdoors- bled a lot due to his asa Keeping it clean and bandaged /Td 07  Patient Active Problem List   Diagnosis Date Noted  . Laceration of arm 11/11/2012  . Hyperglycemia 07/31/2011  . HTN (hypertension) 06/16/2011  . Night muscle spasms 01/21/2011  . BACK PAIN 01/16/2010  . CAD, AUTOLOGOUS BYPASS GRAFT 11/07/2008  . FLATULENCE-GAS-BLOATING 08/02/2008  . RUQ PAIN 07/10/2008  . HEMATOMA 07/05/2008  . URINARY CALCULUS 10/17/2007  . CAD 10/03/2007  . HYPERLIPIDEMIA 04/13/2007  . DECREASED HEARING 04/13/2007  . RHINITIS, CHRONIC 04/13/2007  . GERD 04/13/2007  . Actinic Keratosis 04/13/2007  . NEPHROLITHIASIS, HX OF 04/13/2007   Past Medical History  Diagnosis Date  . GERD (gastroesophageal reflux disease)   . Gastritis   . Hyperlipidemia   . History of nephrolithiasis   . Partial tear of rotator cuff     Right  . Hypertension   . Coronary artery disease     Post CABG in 2001 with patent grafts in 2010  (90% mid LAD stenosis with competing flow from LIMA distally, 90% OM stenosis with competing flow from SVG,  90% RCA stenosis with competing flow from SVG, occluded SVG to Posterolateral branch).  . Bifascicular  block   . Unstable angina    Past Surgical History  Procedure Laterality Date  . 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 Diffuision of unstable angina  03/2000  . Coronary artery bypass graft  2001  . Back surgery    . Ct abd w & pelvis wo cm  09/2001    Kidney stones  . Nasal sinus surgery  04/2002  . Stress cardiolite  10/2003    No acute changes  . Neck surgery    . Ct abd wo/w & pelvis wo cm  10/14/2007    Low grade obstruction on R by 4mm prox ereteral calculus, Sm bilat renal calculi sm HH  . Pelvic ct  10/14/2007    nml  . Kidney stone surgery  10/2007    Removal R ureteral kidney stone  . Esophagogastroduodenoscopy  08/2008    Mild gastritis and GERD  . Cardiac catheterization  01/2000    Multi vessel PTCA  . Cardiac catheterization  11/2008    multi vesstel dz, re occlusion small vessel   History  Substance Use Topics  . Smoking status: Former Smoker    Quit date: 07/07/1999  . Smokeless tobacco: Not on file  . Alcohol Use: No   Family History  Problem Relation Age of Onset  . Transient ischemic attack  Mother   . Coronary artery disease Mother   . Heart disease Mother     CAD  . Alcohol abuse Father   . Diabetes type II Father   . Diabetes Father   . Heart disease Father     pacemaker   No Known Allergies Current Outpatient Prescriptions on File Prior to Visit  Medication Sig Dispense Refill  . aspirin 325 MG EC tablet Take 325 mg by mouth daily.        . diphenhydramine-acetaminophen (TYLENOL PM EXTRA STRENGTH) 25-500 MG TABS Take 1 tablet by mouth as directed.        . Multiple Vitamin (MULTIVITAMIN) tablet Take 1 tablet by mouth daily.         No current facility-administered medications on file prior to visit.    Review of Systems Review of Systems  Constitutional: Negative for fever, appetite change, fatigue and unexpected weight change.  Eyes: Negative for pain and visual disturbance.  Respiratory: Negative for cough and shortness of breath.    Cardiovascular: Negative for cp or palpitations    Gastrointestinal: Negative for nausea, diarrhea and constipation.  Genitourinary: Negative for urgency and frequency.  Skin: Negative for pallor or rash  pos for scrapes and scratches from outdoor work  Neurological: Negative for weakness, light-headedness, numbness and headaches.  Hematological: Negative for adenopathy. Does not bruise/bleed easily.  Psychiatric/Behavioral: Negative for dysphoric mood. The patient is not nervous/anxious.         Objective:   Physical Exam  Constitutional: He appears well-developed and well-nourished. No distress.  HENT:  Head: Normocephalic and atraumatic.  Mouth/Throat: Oropharynx is clear and moist.  Eyes: Conjunctivae and EOM are normal. Pupils are equal, round, and reactive to light. Right eye exhibits no discharge. Left eye exhibits no discharge. No scleral icterus.  Neck: Normal range of motion. Neck supple. No JVD present. Carotid bruit is not present. No thyromegaly present.  Cardiovascular: Normal rate, regular rhythm, normal heart sounds and intact distal pulses.  Exam reveals no gallop.   Pulmonary/Chest: Effort normal and breath sounds normal. No respiratory distress. He has no wheezes.  Abdominal: Soft. Bowel sounds are normal. He exhibits no distension, no abdominal bruit and no mass. There is no tenderness.  Musculoskeletal: He exhibits no edema and no tenderness.  Lymphadenopathy:    He has no cervical adenopathy.  Neurological: He is alert. He has normal reflexes. No cranial nerve deficit. He exhibits normal muscle tone. Coordination normal.  Skin: Skin is warm and dry. No rash noted. No erythema. No pallor.  Small skin tear on R arm   Several scratches-no signs of infection   Psychiatric: He has a normal mood and affect.          Assessment & Plan:

## 2012-11-11 NOTE — Assessment & Plan Note (Signed)
bp in fair control at this time  No changes needed  Disc lifstyle change with low sodium diet and exercise  Urged to stay active Lab today

## 2012-11-11 NOTE — Assessment & Plan Note (Signed)
Disc goals for lipids and reasons to control them Lab today Statin and diet

## 2012-11-15 ENCOUNTER — Encounter: Payer: Self-pay | Admitting: *Deleted

## 2013-02-28 ENCOUNTER — Other Ambulatory Visit: Payer: Medicare Other

## 2013-03-07 ENCOUNTER — Encounter: Payer: Medicare Other | Admitting: Family Medicine

## 2013-03-27 DIAGNOSIS — Z23 Encounter for immunization: Secondary | ICD-10-CM | POA: Diagnosis not present

## 2013-04-30 ENCOUNTER — Telehealth: Payer: Self-pay | Admitting: Family Medicine

## 2013-04-30 DIAGNOSIS — I10 Essential (primary) hypertension: Secondary | ICD-10-CM

## 2013-04-30 DIAGNOSIS — R739 Hyperglycemia, unspecified: Secondary | ICD-10-CM

## 2013-04-30 NOTE — Telephone Encounter (Signed)
Message copied by Judy Pimple on Sun Apr 30, 2013  5:40 PM ------      Message from: Alvina Chou      Created: Tue Apr 25, 2013 10:01 AM      Regarding: Lab orders for Monday, 10.27.14       Labs for a 6 month f/u ------

## 2013-05-01 ENCOUNTER — Other Ambulatory Visit (INDEPENDENT_AMBULATORY_CARE_PROVIDER_SITE_OTHER): Payer: Medicare Other

## 2013-05-01 DIAGNOSIS — R739 Hyperglycemia, unspecified: Secondary | ICD-10-CM

## 2013-05-01 DIAGNOSIS — I10 Essential (primary) hypertension: Secondary | ICD-10-CM | POA: Diagnosis not present

## 2013-05-01 DIAGNOSIS — R7309 Other abnormal glucose: Secondary | ICD-10-CM | POA: Diagnosis not present

## 2013-05-01 LAB — COMPREHENSIVE METABOLIC PANEL
ALT: 28 U/L (ref 0–53)
AST: 24 U/L (ref 0–37)
Albumin: 4 g/dL (ref 3.5–5.2)
CO2: 32 mEq/L (ref 19–32)
Calcium: 8.9 mg/dL (ref 8.4–10.5)
Chloride: 102 mEq/L (ref 96–112)
Creatinine, Ser: 1 mg/dL (ref 0.4–1.5)
GFR: 76.52 mL/min (ref >60.00)
Potassium: 3.8 mEq/L (ref 3.5–5.1)

## 2013-05-09 ENCOUNTER — Ambulatory Visit (INDEPENDENT_AMBULATORY_CARE_PROVIDER_SITE_OTHER): Payer: Medicare Other | Admitting: Family Medicine

## 2013-05-09 ENCOUNTER — Encounter: Payer: Self-pay | Admitting: Family Medicine

## 2013-05-09 VITALS — BP 140/82 | HR 55 | Temp 98.4°F | Ht 71.0 in | Wt 192.5 lb

## 2013-05-09 DIAGNOSIS — Z125 Encounter for screening for malignant neoplasm of prostate: Secondary | ICD-10-CM | POA: Insufficient documentation

## 2013-05-09 DIAGNOSIS — L989 Disorder of the skin and subcutaneous tissue, unspecified: Secondary | ICD-10-CM | POA: Diagnosis not present

## 2013-05-09 DIAGNOSIS — R739 Hyperglycemia, unspecified: Secondary | ICD-10-CM

## 2013-05-09 DIAGNOSIS — R7309 Other abnormal glucose: Secondary | ICD-10-CM

## 2013-05-09 DIAGNOSIS — I10 Essential (primary) hypertension: Secondary | ICD-10-CM

## 2013-05-09 NOTE — Patient Instructions (Addendum)
If you are interested in a shingles/zoster vaccine - call your insurance to check on coverage,( you should not get it within 1 month of other vaccines) , then call us for a prescription  for it to take to a pharmacy that gives the shot , or make a nurse visit to get it here depending on your coverage I'm glad you had your flu shot  Take care of yourself  Eat a healthy diet and stay active  We will refer you to dermatology at check out- for the spot on your head  Follow up here for an annual exam in 6 months with labs prior

## 2013-05-09 NOTE — Progress Notes (Signed)
Subjective:    Patient ID: Joel [name], male    DOB: 10-03-1935, 77 y.o.   MRN: [id number]  HPI Here for f/u of chronic health problems  Having allergy problems  He had to go to urgent care for congestion- got a dose of "something" Sinus congestion is bad   Had the flu shot -thinks he had the high dose shot   bp is up today -on first check No cp or palpitations or headaches or edema  No side effects to medicines  BP Readings from Last 3 Encounters:  05/09/13 154/94  11/11/12 132/80  01/29/12 136/78     Wt is up 4 lb with bmi of 26 He ate a big breakfast today   Zoster status - never had a shingles shot   Flu vaccine-had it at the pharmacy Done with pneumonia vaccines    Hyperglycemia Lab Results  Component Value Date   HGBA1C 6.2 05/01/2013   stable     Chemistry      Component Value Date/Time   NA 141 05/01/2013 0758   K 3.8 05/01/2013 0758   CL 102 05/01/2013 0758   CO2 32 05/01/2013 0758   BUN 13 05/01/2013 0758   CREATININE 1.0 05/01/2013 0758      Component Value Date/Time   CALCIUM 8.9 05/01/2013 0758   ALKPHOS 77 05/01/2013 0758   AST 24 05/01/2013 0758   ALT 28 05/01/2013 0758   BILITOT 1.8* 05/01/2013 0758      He "eats what he wants" - tries not to eat too much junk- but he does not pay that much attention  Prep food a little differently- less fried food   Patient Active Problem List   Diagnosis Date Noted  . Laceration of arm 11/11/2012  . Hyperglycemia 07/31/2011  . HTN (hypertension) 06/16/2011  . Night muscle spasms 01/21/2011  . BACK PAIN 01/16/2010  . CAD, AUTOLOGOUS BYPASS GRAFT 11/07/2008  . FLATULENCE-GAS-BLOATING 08/02/2008  . RUQ PAIN 07/10/2008  . HEMATOMA 07/05/2008  . URINARY CALCULUS 10/17/2007  . CAD 10/03/2007  . HYPERLIPIDEMIA 04/13/2007  . DECREASED HEARING 04/13/2007  . RHINITIS, CHRONIC 04/13/2007  . GERD 04/13/2007  . Actinic Keratosis 04/13/2007  . NEPHROLITHIASIS, HX OF 04/13/2007   Past Medical History   Diagnosis Date  . GERD (gastroesophageal reflux disease)   . Gastritis   . Hyperlipidemia   . History of nephrolithiasis   . Partial tear of rotator cuff(726.13)     Right  . Hypertension   . Coronary artery disease     Post CABG in 2001 with patent grafts in 2010  (90% mid LAD stenosis with competing flow from LIMA distally, 90% OM stenosis with competing flow from SVG,  90% RCA stenosis with competing flow from SVG, occluded SVG to Posterolateral branch).  . Bifascicular block   . Unstable angina    Past Surgical History  Procedure Laterality Date  . 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 Diffuision of unstable angina  03/2000  . Coronary artery bypass graft  2001  . Back surgery    . Ct abd w & pelvis wo cm  09/2001    Kidney stones  . Nasal sinus surgery  04/2002  . Stress cardiolite  10/2003    No acute changes  . Neck surgery    . Ct abd wo/w & pelvis wo cm  10/14/2007    Low grade obstruction on R by 4mm prox ereteral calculus, Sm bilat renal calculi sm HH  .  Pelvic ct  10/14/2007    nml  . Kidney stone surgery  10/2007    Removal R ureteral kidney stone  . Esophagogastroduodenoscopy  08/2008    Mild gastritis and GERD  . Cardiac catheterization  01/2000    Multi vessel PTCA  . Cardiac catheterization  11/2008    multi vesstel dz, re occlusion small vessel   History  Substance Use Topics  . Smoking status: Former Smoker    Quit date: 07/07/1999  . Smokeless tobacco: Not on file  . Alcohol Use: No   Family History  Problem Relation Age of Onset  . Transient ischemic attack Mother   . Coronary artery disease Mother   . Heart disease Mother     CAD  . Alcohol abuse Father   . Diabetes type II Father   . Diabetes Father   . Heart disease Father     pacemaker   No Known Allergies Current Outpatient Prescriptions on File Prior to Visit  Medication Sig Dispense Refill  . aspirin 325 MG EC tablet Take 325 mg by mouth daily.        Marland Kitchen atorvastatin (LIPITOR)  40 MG tablet Take 1 tablet (40 mg total) by mouth daily.  30 tablet  11  . diphenhydramine-acetaminophen (TYLENOL PM EXTRA STRENGTH) 25-500 MG TABS Take 1 tablet by mouth as directed.        . hydrochlorothiazide (HYDRODIURIL) 50 MG tablet Take 1 tablet (50 mg total) by mouth daily.  30 tablet  11  . Multiple Vitamin (MULTIVITAMIN) tablet Take 1 tablet by mouth daily.        . nitroGLYCERIN (NITROLINGUAL) 0.4 MG/SPRAY spray Place 2 sprays under the tongue every 5 (five) minutes as needed for chest pain.  12 g  3  . omeprazole (PRILOSEC) 20 MG capsule Take 1 capsule (20 mg total) by mouth daily as needed.  30 capsule  11   No current facility-administered medications on file prior to visit.     Review of Systems     Objective:   Physical Exam  Constitutional: He is oriented to person, place, and time. He appears well-developed and well-nourished. No distress.  HENT:  Head: Normocephalic and atraumatic.  Nose: Nose normal.  Mouth/Throat: Oropharynx is clear and moist.  Very hard of hearing   Eyes: Conjunctivae and EOM are normal. Pupils are equal, round, and reactive to light. Right eye exhibits no discharge. Left eye exhibits no discharge. No scleral icterus.  Neck: Normal range of motion. Neck supple. No JVD present. Carotid bruit is not present. No thyromegaly present.  Cardiovascular: Normal rate, regular rhythm, normal heart sounds and intact distal pulses.  Exam reveals no gallop.   Pulmonary/Chest: Effort normal and breath sounds normal. No respiratory distress. He has no wheezes. He has no rales.  Abdominal: Soft. Bowel sounds are normal. He exhibits no abdominal bruit.  Musculoskeletal: Normal range of motion. He exhibits no edema and no tenderness.  Lymphadenopathy:    He has no cervical adenopathy.  Neurological: He is alert and oriented to person, place, and time. He has normal reflexes. No cranial nerve deficit. He exhibits normal muscle tone. Coordination normal.  Skin: Skin  is warm and dry. No rash noted. No erythema. No pallor.  1.3 cm raised waxy lesion on scalp is bleeding from trauma earlier today (cleaned it with washcloth and it bled)  Many AK s and solar lentigos   Psychiatric: He has a normal mood and affect.  Assessment & Plan:

## 2013-05-10 NOTE — Assessment & Plan Note (Signed)
Large raised keratotic lesion on scalp that is bleeding  Ref to derm Also many AKs

## 2013-05-10 NOTE — Assessment & Plan Note (Signed)
BP: 140/82 mmHg  bp in fair control at this time  No changes needed  Disc lifstyle change with low sodium diet and exercise

## 2013-05-10 NOTE — Assessment & Plan Note (Signed)
Stable Lab Results  Component Value Date   HGBA1C 6.2 05/01/2013   Disc low glycemic diet

## 2013-05-16 DIAGNOSIS — L570 Actinic keratosis: Secondary | ICD-10-CM | POA: Diagnosis not present

## 2013-06-06 DIAGNOSIS — L259 Unspecified contact dermatitis, unspecified cause: Secondary | ICD-10-CM | POA: Diagnosis not present

## 2013-10-24 ENCOUNTER — Ambulatory Visit (INDEPENDENT_AMBULATORY_CARE_PROVIDER_SITE_OTHER)
Admission: RE | Admit: 2013-10-24 | Discharge: 2013-10-24 | Disposition: A | Discharge status: Home / Self Care | Payer: Medicare Other | Source: Ambulatory Visit | Attending: Family Medicine | Admitting: Family Medicine

## 2013-10-24 ENCOUNTER — Encounter: Payer: Self-pay | Admitting: Family Medicine

## 2013-10-24 ENCOUNTER — Ambulatory Visit (INDEPENDENT_AMBULATORY_CARE_PROVIDER_SITE_OTHER): Payer: Medicare Other | Admitting: Family Medicine

## 2013-10-24 VITALS — BP 128/74 | HR 72 | Temp 99.3°F | Ht 71.0 in | Wt 192.0 lb

## 2013-10-24 DIAGNOSIS — R05 Cough: Secondary | ICD-10-CM | POA: Diagnosis not present

## 2013-10-24 DIAGNOSIS — R509 Fever, unspecified: Secondary | ICD-10-CM

## 2013-10-24 DIAGNOSIS — R059 Cough, unspecified: Secondary | ICD-10-CM

## 2013-10-24 DIAGNOSIS — J019 Acute sinusitis, unspecified: Secondary | ICD-10-CM | POA: Diagnosis not present

## 2013-10-24 DIAGNOSIS — B9689 Other specified bacterial agents as the cause of diseases classified elsewhere: Secondary | ICD-10-CM | POA: Insufficient documentation

## 2013-10-24 LAB — POCT INFLUENZA A/B
INFLUENZA A, POC: NEGATIVE
Influenza B, POC: NEGATIVE

## 2013-10-24 MED ORDER — LEVOFLOXACIN 500 MG PO TABS: 500.0000 mg | ORAL_TABLET | Freq: Every day | ORAL | Status: DC

## 2013-10-24 NOTE — Patient Instructions (Signed)
Drink lots of fluids  Get rest Tylenol for fever as directed  Chest xray on the way out- we will call you with the result  Start the levaquin for sinus infection

## 2013-10-24 NOTE — Progress Notes (Signed)
Pre visit review using our clinic review tool, if applicable. No additional management support is needed unless otherwise documented below in the visit note. 

## 2013-10-24 NOTE — Progress Notes (Signed)
Subjective:    Patient ID: Joel [name], male    DOB: Nov 03, 1935, 78 y.o.   MRN: [id number]  HPI Here with uri symptoms and fever  This started on Saturday  His top temperature was 101    He has tylenol severe cold pills -not working as well as it usually was  Getting bad cold chills at night  Not coughing a lot at this point  He spits out mucous (that is not unusual for him) - is yellow  Sunday his eyes watered a lot  His nose is congested and itchy - then clear sinus drainage  Some wheezing only if he walks a long distance     Neg rapid flu test today   Patient Active Problem List   Diagnosis Date Noted  . Skin lesion of scalp 05/09/2013  . Prostate cancer screening 05/09/2013  . Laceration of arm 11/11/2012  . Hyperglycemia 07/31/2011  . HTN (hypertension) 06/16/2011  . Night muscle spasms 01/21/2011  . BACK PAIN 01/16/2010  . CAD, AUTOLOGOUS BYPASS GRAFT 11/07/2008  . FLATULENCE-GAS-BLOATING 08/02/2008  . RUQ PAIN 07/10/2008  . HEMATOMA 07/05/2008  . URINARY CALCULUS 10/17/2007  . CAD 10/03/2007  . HYPERLIPIDEMIA 04/13/2007  . DECREASED HEARING 04/13/2007  . RHINITIS, CHRONIC 04/13/2007  . GERD 04/13/2007  . Actinic Keratosis 04/13/2007  . NEPHROLITHIASIS, HX OF 04/13/2007   Past Medical History  Diagnosis Date  . GERD (gastroesophageal reflux disease)   . Gastritis   . Hyperlipidemia   . History of nephrolithiasis   . Partial tear of rotator cuff(726.13)     Right  . Hypertension   . Coronary artery disease     Post CABG in 2001 with patent grafts in 2010  (90% mid LAD stenosis with competing flow from LIMA distally, 90% OM stenosis with competing flow from SVG,  90% RCA stenosis with competing flow from SVG, occluded SVG to Posterolateral branch).  . Bifascicular block   . Unstable angina    Past Surgical History  Procedure Laterality Date  . 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 Diffuision of unstable angina  03/2000  . Coronary artery bypass  graft  2001  . Back surgery    . Ct abd w & pelvis wo cm  09/2001    Kidney stones  . Nasal sinus surgery  04/2002  . Stress cardiolite  10/2003    No acute changes  . Neck surgery    . Ct abd wo/w & pelvis wo cm  10/14/2007    Low grade obstruction on R by 76mm prox ereteral calculus, Sm bilat renal calculi sm HH  . Pelvic ct  10/14/2007    nml  . Kidney stone surgery  10/2007    Removal R ureteral kidney stone  . Esophagogastroduodenoscopy  08/2008    Mild gastritis and GERD  . Cardiac catheterization  01/2000    Multi vessel PTCA  . Cardiac catheterization  11/2008    multi vesstel dz, re occlusion small vessel   History  Substance Use Topics  . Smoking status: Former Smoker    Quit date: 07/07/1999  . Smokeless tobacco: Not on file  . Alcohol Use: No   Family History  Problem Relation Age of Onset  . Transient ischemic attack Mother   . Coronary artery disease Mother   . Heart disease Mother     CAD  . Alcohol abuse Father   . Diabetes type II Father   . Diabetes Father   .  Heart disease Father     pacemaker   No Known Allergies Current Outpatient Prescriptions on File Prior to Visit  Medication Sig Dispense Refill  . aspirin 325 MG EC tablet Take 325 mg by mouth daily.        Marland Kitchen atorvastatin (LIPITOR) 40 MG tablet Take 1 tablet (40 mg total) by mouth daily.  30 tablet  11  . diphenhydramine-acetaminophen (TYLENOL PM EXTRA STRENGTH) 25-500 MG TABS Take 1 tablet by mouth as directed.        . hydrochlorothiazide (HYDRODIURIL) 50 MG tablet Take 1 tablet (50 mg total) by mouth daily.  30 tablet  11  . Multiple Vitamin (MULTIVITAMIN) tablet Take 1 tablet by mouth daily.        . nitroGLYCERIN (NITROLINGUAL) 0.4 MG/SPRAY spray Place 2 sprays under the tongue every 5 (five) minutes as needed for chest pain.  12 g  3  . omeprazole (PRILOSEC) 20 MG capsule Take 1 capsule (20 mg total) by mouth daily as needed.  30 capsule  11   No current facility-administered medications  on file prior to visit.    He had a flu vaccine this season    Review of Systems Review of Systems  Constitutional: Negative for , appetite change,  and unexpected weight change. pos for fatigue and malaise ENT pos for sinus pain and sinus congestion , post nasal drip  Eyes: Negative for pain and visual disturbance.  Respiratory: Negative for shortness of breath.  pos for cough/ wheeze  Cardiovascular: Negative for cp or palpitations    Gastrointestinal: Negative for nausea, diarrhea and constipation.  Genitourinary: Negative for urgency and frequency.  Skin: Negative for pallor or rash   Neurological: Negative for weakness, light-headedness, numbness and headaches.  Hematological: Negative for adenopathy. Does not bruise/bleed easily.  Psychiatric/Behavioral: Negative for dysphoric mood. The patient is not nervous/anxious.         Objective:   Physical Exam  Constitutional: He appears well-developed and well-nourished. No distress.  Fatigued but non toxic appearing   HENT:  Head: Normocephalic and atraumatic.  Right Ear: External ear normal.  Left Ear: External ear normal.  Mouth/Throat: Oropharynx is clear and moist.  Nares are injected and congested  Bilateral frontal and maxillary sinus tenderness  TMs are dull Throat is clear with post nasal drip  Eyes: Conjunctivae and EOM are normal. Pupils are equal, round, and reactive to light. No scleral icterus.  Neck: Normal range of motion. Neck supple. No JVD present. Carotid bruit is not present. No thyromegaly present.  Cardiovascular: Normal rate and regular rhythm.   Pulmonary/Chest: Effort normal and breath sounds normal. No respiratory distress. He has no wheezes. He has no rales. He exhibits no tenderness.  Diffusely distant bs - baseline   Musculoskeletal: He exhibits no edema and no tenderness.  Lymphadenopathy:    He has no cervical adenopathy.  Neurological: He is alert. He has normal reflexes. No cranial nerve  deficit. He exhibits normal muscle tone. Coordination normal.  Skin: Skin is warm and dry. No rash noted. No erythema.  Psychiatric: He has a normal mood and affect.          Assessment & Plan:

## 2013-10-25 NOTE — Assessment & Plan Note (Signed)
Suspect uri/ early bronchitis  cxr no acute change  Covered with levaquin  Update

## 2013-10-25 NOTE — Assessment & Plan Note (Signed)
Also with symptoms of bronchitis including fever  Will cover with levaquin Also cxr today- clear / no infiltrate  Disc symptomatic care - see instructions on AVS  Update if not starting to improve in a week or if worsening  - will watch fever closely

## 2013-10-29 ENCOUNTER — Telehealth: Payer: Self-pay | Admitting: Family Medicine

## 2013-10-29 DIAGNOSIS — R739 Hyperglycemia, unspecified: Secondary | ICD-10-CM

## 2013-10-29 DIAGNOSIS — I10 Essential (primary) hypertension: Secondary | ICD-10-CM

## 2013-10-29 DIAGNOSIS — E785 Hyperlipidemia, unspecified: Secondary | ICD-10-CM

## 2013-10-29 NOTE — Telephone Encounter (Signed)
Message copied by Abner Greenspan on Sun Oct 29, 2013 11:43 AM ------      Message from: Ellamae Sia      Created: Mon Oct 23, 2013 12:13 PM      Regarding: Lab orders for Monday, 4.27.15       Patient is scheduled for CPX labs, please order future labs, Thanks , Terri       ------

## 2013-10-30 ENCOUNTER — Other Ambulatory Visit (INDEPENDENT_AMBULATORY_CARE_PROVIDER_SITE_OTHER): Payer: Medicare Other

## 2013-10-30 DIAGNOSIS — R739 Hyperglycemia, unspecified: Secondary | ICD-10-CM

## 2013-10-30 DIAGNOSIS — I10 Essential (primary) hypertension: Secondary | ICD-10-CM | POA: Diagnosis not present

## 2013-10-30 DIAGNOSIS — R7309 Other abnormal glucose: Secondary | ICD-10-CM

## 2013-10-30 DIAGNOSIS — E785 Hyperlipidemia, unspecified: Secondary | ICD-10-CM

## 2013-10-30 LAB — CBC WITH DIFFERENTIAL/PLATELET
Basophils Absolute: 0 10*3/uL (ref 0.0–0.1)
Basophils Relative: 0.3 % (ref 0.0–3.0)
Eosinophils Absolute: 0.1 10*3/uL (ref 0.0–0.7)
Eosinophils Relative: 1.4 % (ref 0.0–5.0)
HCT: 46.3 % (ref 39.0–52.0)
Hemoglobin: 15.7 g/dL (ref 13.0–17.0)
Lymphocytes Relative: 16 % (ref 12.0–46.0)
Lymphs Abs: 1.5 10*3/uL (ref 0.7–4.0)
MCHC: 33.8 g/dL (ref 30.0–36.0)
MCV: 93.8 fl (ref 78.0–100.0)
Monocytes Absolute: 0.8 10*3/uL (ref 0.1–1.0)
Monocytes Relative: 8.8 % (ref 3.0–12.0)
Neutro Abs: 6.8 10*3/uL (ref 1.4–7.7)
Neutrophils Relative %: 73.5 % (ref 43.0–77.0)
Platelets: 276 10*3/uL (ref 150.0–400.0)
RBC: 4.94 Mil/uL (ref 4.22–5.81)
RDW: 12.8 % (ref 11.5–14.6)
WBC: 9.3 10*3/uL (ref 4.5–10.5)

## 2013-10-30 LAB — LIPID PANEL
Cholesterol: 128 mg/dL (ref 0–200)
HDL: 30.7 mg/dL — ABNORMAL LOW (ref >39.00)
LDL Cholesterol: 79 mg/dL (ref 0–99)
Total CHOL/HDL Ratio: 4
Triglycerides: 94 mg/dL (ref 0.0–149.0)
VLDL: 18.8 mg/dL (ref 0.0–40.0)

## 2013-10-30 LAB — COMPREHENSIVE METABOLIC PANEL
ALT: 38 U/L (ref 0–53)
AST: 27 U/L (ref 0–37)
Albumin: 3.6 g/dL (ref 3.5–5.2)
Alkaline Phosphatase: 79 U/L (ref 39–117)
BUN: 14 mg/dL (ref 6–23)
CALCIUM: 9 mg/dL (ref 8.4–10.5)
CHLORIDE: 103 meq/L (ref 96–112)
CO2: 30 mEq/L (ref 19–32)
CREATININE: 0.9 mg/dL (ref 0.4–1.5)
GFR: 86.19 mL/min (ref >60.00)
Glucose, Bld: 116 mg/dL — ABNORMAL HIGH (ref 70–99)
POTASSIUM: 3.5 meq/L (ref 3.5–5.1)
Sodium: 140 mEq/L (ref 135–145)
Total Bilirubin: 1.7 mg/dL — ABNORMAL HIGH (ref 0.3–1.2)
Total Protein: 6.6 g/dL (ref 6.0–8.3)

## 2013-10-30 LAB — TSH: TSH: 1.05 u[IU]/mL (ref 0.35–5.50)

## 2013-10-30 LAB — HEMOGLOBIN A1C: Hgb A1c MFr Bld: 6.1 % (ref 4.6–6.5)

## 2013-11-06 ENCOUNTER — Ambulatory Visit (INDEPENDENT_AMBULATORY_CARE_PROVIDER_SITE_OTHER): Payer: Medicare Other | Admitting: Family Medicine

## 2013-11-06 ENCOUNTER — Encounter: Payer: Self-pay | Admitting: Family Medicine

## 2013-11-06 VITALS — BP 136/86 | HR 62 | Temp 98.5°F | Ht 71.25 in | Wt 188.0 lb

## 2013-11-06 DIAGNOSIS — E785 Hyperlipidemia, unspecified: Secondary | ICD-10-CM | POA: Diagnosis not present

## 2013-11-06 DIAGNOSIS — Z23 Encounter for immunization: Secondary | ICD-10-CM | POA: Diagnosis not present

## 2013-11-06 DIAGNOSIS — Z0000 Encounter for general adult medical examination without abnormal findings: Secondary | ICD-10-CM | POA: Diagnosis not present

## 2013-11-06 DIAGNOSIS — R7309 Other abnormal glucose: Secondary | ICD-10-CM | POA: Diagnosis not present

## 2013-11-06 DIAGNOSIS — I10 Essential (primary) hypertension: Secondary | ICD-10-CM | POA: Diagnosis not present

## 2013-11-06 DIAGNOSIS — R739 Hyperglycemia, unspecified: Secondary | ICD-10-CM

## 2013-11-06 DIAGNOSIS — L821 Other seborrheic keratosis: Secondary | ICD-10-CM | POA: Diagnosis not present

## 2013-11-06 DIAGNOSIS — L570 Actinic keratosis: Secondary | ICD-10-CM | POA: Diagnosis not present

## 2013-11-06 MED ORDER — ATORVASTATIN CALCIUM 40 MG PO TABS: 40.0000 mg | ORAL_TABLET | Freq: Every day | ORAL | Status: DC

## 2013-11-06 MED ORDER — OMEPRAZOLE 20 MG PO CPDR: 20.0000 mg | DELAYED_RELEASE_CAPSULE | Freq: Every day | ORAL | Status: DC | PRN

## 2013-11-06 MED ORDER — HYDROCHLOROTHIAZIDE 50 MG PO TABS: 50.0000 mg | ORAL_TABLET | Freq: Every day | ORAL | Status: DC

## 2013-11-06 NOTE — Assessment & Plan Note (Signed)
Reviewed health habits including diet and exercise and skin cancer prevention Reviewed appropriate screening tests for age  Also reviewed health mt list, fam hx and immunization status , as well as social and family history   See HPI Labs rev  Pt does not want to screen for cancer at this age

## 2013-11-06 NOTE — Progress Notes (Signed)
Subjective:    Patient ID: Joel [name], male    DOB: [birth year], 78 y.o.   MRN: [id number]  HPI I have personally reviewed the Medicare Annual Wellness questionnaire and have noted 1. The patient's medical and social history 2. Their use of alcohol, tobacco or illicit drugs 3. Their current medications and supplements 4. The patient's functional ability including ADL's, fall risks, home safety risks and hearing or visual             impairment. 5. Diet and physical activities 6. Evidence for depression or mood disorders  The patients weight, height, BMI have been recorded in the chart and visual acuity is per eye clinic.  I have made referrals, counseling and provided education to the patient based review of the above and I have provided the pt with a written personalized care plan for preventive services.  Pt has hearing loss known- did not wear hearing aides today and thus did not test him  No more fever or facial pain - but stays congested due to allergies  He tried many nasal sprays -otc and px and they do not work    See scanned forms.  Routine anticipatory guidance given to patient.  See health maintenance. Colon cancer screening He declines IFOB - he does not want to screen as a rule  He would never treat cancer if he had it   Flu vaccine 10/14 Tetanus vaccine 5/14 Pneumovax 10/08--will do the prevnar  Zoster vaccine- cannot afford and ins would not cover it  Prostate cancer screening- seen by urology in the past and does not want to screen now  Advance directive- he has a living will and he wants to be DNR Cognitive function addressed- see scanned forms- and if abnormal then additional documentation follows. (he has had some memory issues since old head injury and that has not changed)   PMH and SH reviewed  Rev his medicare paperwork - he indicated that he gets upset at times - due to the state of the world - and he does not think this more than your average American   For the most part he has blue days - but no prolonged depression  He stays motivated and keeps moving the best that he can  He tries to stay "in control of his life" but does not think she is chronically depressed   Meds, vitals, and allergies reviewed.   ROS: See HPI.  Otherwise negative.    bp is stable today  No cp or palpitations or headaches or edema  No side effects to medicines  BP Readings from Last 3 Encounters:  11/06/13 136/86  10/24/13 128/74  05/09/13 140/82       Chemistry      Component Value Date/Time   NA 140 10/30/2013 0818   K 3.5 10/30/2013 0818   CL 103 10/30/2013 0818   CO2 30 10/30/2013 0818   BUN 14 10/30/2013 0818   CREATININE 0.9 10/30/2013 0818      Component Value Date/Time   CALCIUM 9.0 10/30/2013 0818   ALKPHOS 79 10/30/2013 0818   AST 27 10/30/2013 0818   ALT 38 10/30/2013 0818   BILITOT 1.7* 10/30/2013 0818      Hyperglycemia Lab Results  Component Value Date   HGBA1C 6.1 10/30/2013   very good- stable  He still eats ice cream   Hyperlipidemia  On lipitor  Lab Results  Component Value Date   CHOL 128 10/30/2013   CHOL 128 11/11/2012  CHOL 148 01/21/2012   Lab Results  Component Value Date   HDL 30.70* 10/30/2013   HDL 41.00 11/11/2012   HDL 44.00 01/21/2012   Lab Results  Component Value Date   LDLCALC 79 10/30/2013   LDLCALC 73 11/11/2012   LDLCALC 73 01/21/2012   Lab Results  Component Value Date   TRIG 94.0 10/30/2013   TRIG 70.0 11/11/2012   TRIG 153.0* 01/21/2012   Lab Results  Component Value Date   CHOLHDL 4 10/30/2013   CHOLHDL 3 11/11/2012   CHOLHDL 3 01/21/2012   No results found for this basename: LDLDIRECT    HDL was down- unsure why- he stays quite active  Winter- hunting with his dogs  Now is fishing a lot - he has to be careful with the sun   Sees derm later today- to look at his scalp - monitoring closely  He does use sunscreen and wears a big straw hat   He no longer sees cardiology by choice -no new problems    Patient Active Problem List   Diagnosis Date Noted  . Encounter for Medicare annual wellness exam 11/06/2013  . Acute bacterial sinusitis 10/24/2013  . Cough 10/24/2013  . Skin lesion of scalp 05/09/2013  . Prostate cancer screening 05/09/2013  . Laceration of arm 11/11/2012  . Hyperglycemia 07/31/2011  . HTN (hypertension) 06/16/2011  . Night muscle spasms 01/21/2011  . BACK PAIN 01/16/2010  . CAD, AUTOLOGOUS BYPASS GRAFT 11/07/2008  . FLATULENCE-GAS-BLOATING 08/02/2008  . RUQ PAIN 07/10/2008  . HEMATOMA 07/05/2008  . URINARY CALCULUS 10/17/2007  . CAD 10/03/2007  . HYPERLIPIDEMIA 04/13/2007  . DECREASED HEARING 04/13/2007  . RHINITIS, CHRONIC 04/13/2007  . GERD 04/13/2007  . Actinic Keratosis 04/13/2007  . NEPHROLITHIASIS, HX OF 04/13/2007   Past Medical History  Diagnosis Date  . GERD (gastroesophageal reflux disease)   . Gastritis   . Hyperlipidemia   . History of nephrolithiasis   . Partial tear of rotator cuff(726.13)     Right  . Hypertension   . Coronary artery disease     Post CABG in 2001 with patent grafts in 2010  (90% mid LAD stenosis with competing flow from LIMA distally, 90% OM stenosis with competing flow from SVG,  90% RCA stenosis with competing flow from SVG, occluded SVG to Posterolateral branch).  . Bifascicular block   . Unstable angina    Past Surgical History  Procedure Laterality Date  . 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 Diffuision of unstable angina  03/2000  . Coronary artery bypass graft  2001  . Back surgery    . Ct abd w & pelvis wo cm  09/2001    Kidney stones  . Nasal sinus surgery  04/2002  . Stress cardiolite  10/2003    No acute changes  . Neck surgery    . Ct abd wo/w & pelvis wo cm  10/14/2007    Low grade obstruction on R by 30mm prox ereteral calculus, Sm bilat renal calculi sm HH  . Pelvic ct  10/14/2007    nml  . Kidney stone surgery  10/2007    Removal R ureteral kidney stone  . Esophagogastroduodenoscopy   08/2008    Mild gastritis and GERD  . Cardiac catheterization  01/2000    Multi vessel PTCA  . Cardiac catheterization  11/2008    multi vesstel dz, re occlusion small vessel   History  Substance Use Topics  . Smoking status: Former Smoker  Quit date: 07/07/1999  . Smokeless tobacco: Not on file  . Alcohol Use: No   Family History  Problem Relation Age of Onset  . Transient ischemic attack Mother   . Coronary artery disease Mother   . Heart disease Mother     CAD  . Alcohol abuse Father   . Diabetes type II Father   . Diabetes Father   . Heart disease Father     pacemaker   No Known Allergies Current Outpatient Prescriptions on File Prior to Visit  Medication Sig Dispense Refill  . aspirin 325 MG EC tablet Take 325 mg by mouth daily.        . diphenhydramine-acetaminophen (TYLENOL PM EXTRA STRENGTH) 25-500 MG TABS Take 1 tablet by mouth as directed.        . Multiple Vitamin (MULTIVITAMIN) tablet Take 1 tablet by mouth daily.        . nitroGLYCERIN (NITROLINGUAL) 0.4 MG/SPRAY spray Place 2 sprays under the tongue every 5 (five) minutes as needed for chest pain.  12 g  3   No current facility-administered medications on file prior to visit.    Review of Systems Review of Systems  Constitutional: Negative for fever, appetite change, fatigue and unexpected weight change.  Eyes: Negative for pain and visual disturbance.  ENT pos for chronic nasal congestion and hearing loss  Respiratory: Negative for cough and shortness of breath.   Cardiovascular: Negative for cp or palpitations    Gastrointestinal: Negative for nausea, diarrhea and constipation.  Genitourinary: Negative for urgency and frequency.  Skin: Negative for pallor or rash   Neurological: Negative for weakness, light-headedness, numbness and headaches.  Hematological: Negative for adenopathy. Does not bruise/bleed easily.  Psychiatric/Behavioral: Negative for dysphoric mood. The patient is not nervous/anxious.          Objective:   Physical Exam  Constitutional: He appears well-developed and well-nourished. No distress.  HENT:  Head: Normocephalic and atraumatic.  Right Ear: External ear normal.  Left Ear: External ear normal.  Nose: Nose normal.  Mouth/Throat: Oropharynx is clear and moist.  HOH-no hearing aides today, he talks loudly  Eyes: Conjunctivae and EOM are normal. Pupils are equal, round, and reactive to light. Right eye exhibits no discharge. Left eye exhibits no discharge. No scleral icterus.  Neck: Normal range of motion. Neck supple. No JVD present. Carotid bruit is not present. No thyromegaly present.  Cardiovascular: Normal rate, regular rhythm, normal heart sounds and intact distal pulses.  Exam reveals no gallop.   Pulmonary/Chest: Effort normal and breath sounds normal. No respiratory distress. He has no wheezes. He exhibits no tenderness.  Abdominal: Soft. Bowel sounds are normal. He exhibits no distension, no abdominal bruit and no mass. There is no tenderness.  Genitourinary:  Pt declines prostate ca screen   Musculoskeletal: He exhibits no edema and no tenderness.  Lymphadenopathy:    He has no cervical adenopathy.  Neurological: He is alert. He has normal reflexes. No cranial nerve deficit. He exhibits normal muscle tone. Coordination normal.  Skin: Skin is warm and dry. No rash noted. No erythema. No pallor.  Solar aging/ AKs diffusely-pt aware and goes to dermatology  Great toenails are thickened bilat - no tenderness   Psychiatric: He has a normal mood and affect.          Assessment & Plan:

## 2013-11-06 NOTE — Progress Notes (Signed)
Pre visit review using our clinic review tool, if applicable. No additional management support is needed unless otherwise documented below in the visit note. 

## 2013-11-06 NOTE — Assessment & Plan Note (Signed)
Disc goals for lipids and reasons to control them Rev labs with pt Rev low sat fat diet in detail   

## 2013-11-06 NOTE — Patient Instructions (Signed)
Prevnar vaccine today (that immunizes you against some additional kinds of pneumonia) Take care of yourself  Stay active  Keep using sunscreen and hats  Labs are stable

## 2013-11-06 NOTE — Assessment & Plan Note (Signed)
bp in fair control at this time  BP Readings from Last 1 Encounters:  11/06/13 136/86   No changes needed Disc lifstyle change with low sodium diet and exercise   Labs reviewed

## 2013-11-06 NOTE — Assessment & Plan Note (Signed)
Lab Results  Component Value Date   HGBA1C 6.1 10/30/2013   Disc low glycemic diet Will continue to follow

## 2013-11-07 ENCOUNTER — Telehealth: Payer: Self-pay | Admitting: Family Medicine

## 2013-11-07 NOTE — Telephone Encounter (Signed)
Relevant patient education mailed to patient.  

## 2014-04-16 DIAGNOSIS — Z23 Encounter for immunization: Secondary | ICD-10-CM | POA: Diagnosis not present

## 2014-05-07 DIAGNOSIS — L570 Actinic keratosis: Secondary | ICD-10-CM | POA: Diagnosis not present

## 2014-11-01 ENCOUNTER — Telehealth: Payer: Self-pay | Admitting: Family Medicine

## 2014-11-01 DIAGNOSIS — Z125 Encounter for screening for malignant neoplasm of prostate: Secondary | ICD-10-CM

## 2014-11-01 DIAGNOSIS — E785 Hyperlipidemia, unspecified: Secondary | ICD-10-CM

## 2014-11-01 DIAGNOSIS — R739 Hyperglycemia, unspecified: Secondary | ICD-10-CM

## 2014-11-01 DIAGNOSIS — I10 Essential (primary) hypertension: Secondary | ICD-10-CM

## 2014-11-01 NOTE — Telephone Encounter (Signed)
-----   Message from Marchia Bond sent at 10/29/2014  3:32 PM EDT ----- Regarding: Cpx labs Fri 4/29, need orders Please order  future cpx labs for pt's upcoming lab appt. Thanks Aniceto Boss

## 2014-11-02 ENCOUNTER — Other Ambulatory Visit (INDEPENDENT_AMBULATORY_CARE_PROVIDER_SITE_OTHER): Payer: Medicare Other

## 2014-11-02 DIAGNOSIS — I10 Essential (primary) hypertension: Secondary | ICD-10-CM | POA: Diagnosis not present

## 2014-11-02 DIAGNOSIS — E785 Hyperlipidemia, unspecified: Secondary | ICD-10-CM | POA: Diagnosis not present

## 2014-11-02 DIAGNOSIS — R739 Hyperglycemia, unspecified: Secondary | ICD-10-CM

## 2014-11-02 LAB — LIPID PANEL
CHOL/HDL RATIO: 3
CHOLESTEROL: 132 mg/dL (ref 0–200)
HDL: 39.2 mg/dL (ref >39.00)
LDL CALC: 65 mg/dL (ref 0–99)
NonHDL: 92.8
Triglycerides: 138 mg/dL (ref 0.0–149.0)
VLDL: 27.6 mg/dL (ref 0.0–40.0)

## 2014-11-02 LAB — COMPREHENSIVE METABOLIC PANEL WITH GFR
ALT: 27 U/L (ref 0–53)
AST: 21 U/L (ref 0–37)
Albumin: 3.7 g/dL (ref 3.5–5.2)
Alkaline Phosphatase: 75 U/L (ref 39–117)
BUN: 13 mg/dL (ref 6–23)
CO2: 30 meq/L (ref 19–32)
Calcium: 9.1 mg/dL (ref 8.4–10.5)
Chloride: 106 meq/L (ref 96–112)
Creatinine, Ser: 0.92 mg/dL (ref 0.40–1.50)
GFR: 84.88 mL/min
Glucose, Bld: 103 mg/dL — ABNORMAL HIGH (ref 70–99)
Potassium: 3.8 meq/L (ref 3.5–5.1)
Sodium: 142 meq/L (ref 135–145)
Total Bilirubin: 1.7 mg/dL — ABNORMAL HIGH (ref 0.2–1.2)
Total Protein: 5.9 g/dL — ABNORMAL LOW (ref 6.0–8.3)

## 2014-11-02 LAB — CBC WITH DIFFERENTIAL/PLATELET
Basophils Absolute: 0 10*3/uL (ref 0.0–0.1)
Basophils Relative: 0.6 % (ref 0.0–3.0)
Eosinophils Absolute: 0.5 10*3/uL (ref 0.0–0.7)
Eosinophils Relative: 6.1 % — ABNORMAL HIGH (ref 0.0–5.0)
HCT: 45.8 % (ref 39.0–52.0)
Hemoglobin: 15.9 g/dL (ref 13.0–17.0)
Lymphocytes Relative: 30.6 % (ref 12.0–46.0)
Lymphs Abs: 2.4 10*3/uL (ref 0.7–4.0)
MCHC: 34.6 g/dL (ref 30.0–36.0)
MCV: 92.9 fl (ref 78.0–100.0)
Monocytes Absolute: 0.7 10*3/uL (ref 0.1–1.0)
Monocytes Relative: 8.3 % (ref 3.0–12.0)
Neutro Abs: 4.3 10*3/uL (ref 1.4–7.7)
Neutrophils Relative %: 54.4 % (ref 43.0–77.0)
Platelets: 202 10*3/uL (ref 150.0–400.0)
RBC: 4.93 Mil/uL (ref 4.22–5.81)
RDW: 13 % (ref 11.5–15.5)
WBC: 7.9 10*3/uL (ref 4.0–10.5)

## 2014-11-02 LAB — TSH: TSH: 1.08 u[IU]/mL (ref 0.35–4.50)

## 2014-11-02 LAB — HEMOGLOBIN A1C: Hgb A1c MFr Bld: 6.1 % (ref 4.6–6.5)

## 2014-11-09 ENCOUNTER — Ambulatory Visit (INDEPENDENT_AMBULATORY_CARE_PROVIDER_SITE_OTHER): Payer: Medicare Other | Admitting: Family Medicine

## 2014-11-09 ENCOUNTER — Encounter: Payer: Self-pay | Admitting: Family Medicine

## 2014-11-09 VITALS — BP 140/85 | HR 64 | Temp 98.5°F | Ht 71.0 in | Wt 188.5 lb

## 2014-11-09 DIAGNOSIS — R739 Hyperglycemia, unspecified: Secondary | ICD-10-CM

## 2014-11-09 DIAGNOSIS — Z9181 History of falling: Secondary | ICD-10-CM

## 2014-11-09 DIAGNOSIS — E785 Hyperlipidemia, unspecified: Secondary | ICD-10-CM | POA: Diagnosis not present

## 2014-11-09 DIAGNOSIS — Z0000 Encounter for general adult medical examination without abnormal findings: Secondary | ICD-10-CM | POA: Diagnosis not present

## 2014-11-09 DIAGNOSIS — I10 Essential (primary) hypertension: Secondary | ICD-10-CM

## 2014-11-09 DIAGNOSIS — H9193 Unspecified hearing loss, bilateral: Secondary | ICD-10-CM | POA: Diagnosis not present

## 2014-11-09 MED ORDER — NITROGLYCERIN 0.4 MG/SPRAY TL SOLN: 2.0000 | Status: DC | PRN

## 2014-11-09 MED ORDER — ATORVASTATIN CALCIUM 40 MG PO TABS
40.0000 mg | ORAL_TABLET | Freq: Every day | ORAL | Status: DC
Start: 2014-11-09 — End: 2015-09-10

## 2014-11-09 MED ORDER — OMEPRAZOLE 20 MG PO CPDR: 20.0000 mg | DELAYED_RELEASE_CAPSULE | Freq: Every day | ORAL | Status: DC | PRN

## 2014-11-09 MED ORDER — HYDROCHLOROTHIAZIDE 50 MG PO TABS: 50.0000 mg | ORAL_TABLET | Freq: Every day | ORAL | Status: DC

## 2014-11-09 NOTE — Assessment & Plan Note (Signed)
Pt gets by with this and declines further eval or tx

## 2014-11-09 NOTE — Assessment & Plan Note (Signed)
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

## 2014-11-09 NOTE — Assessment & Plan Note (Addendum)
Reviewed health habits including diet and exercise and skin cancer prevention Reviewed appropriate screening tests for age  Also reviewed health mt list, fam hx and immunization status , as well as social and family history   See HPI Labs reviewed  Pt is not interested in colon cancer screening  He is interested in quality of life /less prevention  He declines further f/u with cardiology for CAD Rev his goals for health  Declines further eval or tx of hearing loss Disc hx of falls when hunting - he is ok with the risks and disc fall prec

## 2014-11-09 NOTE — Patient Instructions (Addendum)
I'm glad you are doing well  Try to be as careful as possible to prevent falls when hunting - make sure your boots have good traction  Try to eat a healthy low fat diet  No change in medicines  Make sure with work that you have enough time to take care of yourself

## 2014-11-09 NOTE — Progress Notes (Signed)
Pre visit review using our clinic review tool, if applicable. No additional management support is needed unless otherwise documented below in the visit note. 

## 2014-11-09 NOTE — Assessment & Plan Note (Signed)
Better on 2nd check bp in fair control at this time  BP Readings from Last 1 Encounters:  11/09/14 140/85   No changes needed Disc lifstyle change with low sodium diet and exercise   Labs reviewed

## 2014-11-09 NOTE — Assessment & Plan Note (Signed)
Pt tends to fall when hunting -slips on dry leaves when walking on hills He wears good boots to prevent this  Unwilling to stop hunting or employ other fall precautions at this time

## 2014-11-09 NOTE — Assessment & Plan Note (Signed)
Disc goals for lipids and reasons to control them Rev labs with pt Rev low sat fat diet in detail This is slt improved Pt unsure he wants to continue lipitor due to some muscle aches and pains - he understands imp of chol control for MI and cva prev (esp in light of CAD)

## 2014-11-09 NOTE — Progress Notes (Signed)
Subjective:    Patient ID: Joel [name], male    DOB: 25-Jan-1936, 79 y.o.   MRN: [id number]  HPI  Here for annual medicare wellness visit as well as chronic/acute medical problems   I have personally reviewed the Medicare Annual Wellness questionnaire and have noted 1. The patient's medical and social history 2. Their use of alcohol, tobacco or illicit drugs 3. Their current medications and supplements 4. The patient's functional ability including ADL's, fall risks, home safety risks and hearing or visual             impairment. 5. Diet and physical activities 6. Evidence for depression or mood disorders  The patients weight, height, BMI have been recorded in the chart and visual acuity is per eye clinic.  I have made referrals, counseling and provided education to the patient based review of the above and I have provided the pt with a written personalized care plan for preventive services. Reviewed and updated provider list, see scanned forms.  Doing well -overall - no change in chronic complaints-has fleeting aches and pains  Likes to work -Architect  Did a lot of hunting this winter Very busy- likes to keep moving   See scanned forms.  Routine anticipatory guidance given to patient.  See health maintenance. Colon cancer screening- declines ifob  Has had some tripping incidents (pt says not full falls)- happens when walking on dry leaves when walking on incline when he hunts -no injuries (he is not bothered by this)-wears good boots and clothes  Flu vaccine -had it in the fall (he keeps sinus problems -not as bad) Tetanus vaccine 5/14 Pneumovax complete- 5/15 Zoster vaccine-does not want/too expensive Prostate cancer screening-done by urologist  Advance directive -has a living will and POA written up - has DNR order Cognitive function addressed- see scanned forms- and if abnormal then additional documentation follows. - no major concerns/ he notes some short term issues (has  had problems since head injury years ago)  Is independent   Hearing loss- thinks is basically the same, does not hear TV well  Has not been interested in tx    PMH and SH reviewed  Meds, vitals, and allergies reviewed.   ROS: See HPI.  Otherwise negative.     Hyperlipidemia Lab Results  Component Value Date   CHOL 132 11/02/2014   CHOL 128 10/30/2013   CHOL 128 11/11/2012   Lab Results  Component Value Date   HDL 39.20 11/02/2014   HDL 30.70* 10/30/2013   HDL 41.00 11/11/2012   Lab Results  Component Value Date   LDLCALC 65 11/02/2014   LDLCALC 79 10/30/2013   LDLCALC 73 11/11/2012   Lab Results  Component Value Date   TRIG 138.0 11/02/2014   TRIG 94.0 10/30/2013   TRIG 70.0 11/11/2012   Lab Results  Component Value Date   CHOLHDL 3 11/02/2014   CHOLHDL 4 10/30/2013   CHOLHDL 3 11/11/2012   No results found for: LDLDIRECT   Improved today - takes lipitor  He does have some muscle pain - unsure if he wants to have a trial off statin while he is working   bp is up on first check  today  No cp or palpitations or headaches or edema  No side effects to medicines  BP Readings from Last 3 Encounters:  11/09/14 150/90  11/06/13 136/86  10/24/13 128/74      Hyperglycemia Lab Results  Component Value Date   HGBA1C 6.1 11/02/2014  Stable and well controlled Stable wt with bmi of 26  Patient Active Problem List   Diagnosis Date Noted  . Encounter for Medicare annual wellness exam 11/06/2013  . Acute bacterial sinusitis 10/24/2013  . Skin lesion of scalp 05/09/2013  . Prostate cancer screening 05/09/2013  . Laceration of arm 11/11/2012  . Hyperglycemia 07/31/2011  . HTN (hypertension) 06/16/2011  . Night muscle spasms 01/21/2011  . CAD, AUTOLOGOUS BYPASS GRAFT 11/07/2008  . FLATULENCE-GAS-BLOATING 08/02/2008  . RUQ PAIN 07/10/2008  . URINARY CALCULUS 10/17/2007  . CAD 10/03/2007  . Hyperlipidemia 04/13/2007  . DECREASED HEARING 04/13/2007  .  RHINITIS, CHRONIC 04/13/2007  . GERD 04/13/2007  . Actinic keratosis 04/13/2007  . NEPHROLITHIASIS, HX OF 04/13/2007   Past Medical History  Diagnosis Date  . GERD (gastroesophageal reflux disease)   . Gastritis   . Hyperlipidemia   . History of nephrolithiasis   . Partial tear of rotator cuff(726.13)     Right  . Hypertension   . Coronary artery disease     Post CABG in 2001 with patent grafts in 2010  (90% mid LAD stenosis with competing flow from LIMA distally, 90% OM stenosis with competing flow from SVG,  90% RCA stenosis with competing flow from SVG, occluded SVG to Posterolateral branch).  . Bifascicular block   . Unstable angina    Past Surgical History  Procedure Laterality Date  . 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 Diffuision of unstable angina  03/2000  . Coronary artery bypass graft  2001  . Back surgery    . Ct abd w & pelvis wo cm  09/2001    Kidney stones  . Nasal sinus surgery  04/2002  . Stress cardiolite  10/2003    No acute changes  . Neck surgery    . Ct abd wo/w & pelvis wo cm  10/14/2007    Low grade obstruction on R by 21mm prox ereteral calculus, Sm bilat renal calculi sm HH  . Pelvic ct  10/14/2007    nml  . Kidney stone surgery  10/2007    Removal R ureteral kidney stone  . Esophagogastroduodenoscopy  08/2008    Mild gastritis and GERD  . Cardiac catheterization  01/2000    Multi vessel PTCA  . Cardiac catheterization  11/2008    multi vesstel dz, re occlusion small vessel   History  Substance Use Topics  . Smoking status: Former Smoker    Quit date: 07/07/1999  . Smokeless tobacco: Not on file  . Alcohol Use: No   Family History  Problem Relation Age of Onset  . Transient ischemic attack Mother   . Coronary artery disease Mother   . Heart disease Mother     CAD  . Alcohol abuse Father   . Diabetes type II Father   . Diabetes Father   . Heart disease Father     pacemaker   No Known Allergies Current Outpatient Prescriptions  on File Prior to Visit  Medication Sig Dispense Refill  . aspirin 325 MG EC tablet Take 325 mg by mouth daily.      Marland Kitchen atorvastatin (LIPITOR) 40 MG tablet Take 1 tablet (40 mg total) by mouth daily. 30 tablet 11  . diphenhydramine-acetaminophen (TYLENOL PM EXTRA STRENGTH) 25-500 MG TABS Take 1 tablet by mouth as directed.      . hydrochlorothiazide (HYDRODIURIL) 50 MG tablet Take 1 tablet (50 mg total) by mouth daily. 30 tablet 11  . Multiple Vitamin (MULTIVITAMIN)  tablet Take 1 tablet by mouth daily.      . nitroGLYCERIN (NITROLINGUAL) 0.4 MG/SPRAY spray Place 2 sprays under the tongue every 5 (five) minutes as needed for chest pain. 12 g 3  . omeprazole (PRILOSEC) 20 MG capsule Take 1 capsule (20 mg total) by mouth daily as needed. 30 capsule 11   No current facility-administered medications on file prior to visit.        Review of Systems Review of Systems  Constitutional: Negative for fever, appetite change, fatigue and unexpected weight change.  ENT pos for chronic sinus congestion pos for hearing loss (pt does not want tx) Eyes: Negative for pain and visual disturbance.  Respiratory: Negative for cough and shortness of breath.   Cardiovascular: Negative for cp or palpitations    Gastrointestinal: Negative for nausea, diarrhea and constipation.  Genitourinary: Negative for urgency and frequency.  MSK pos for fleeting joint pains and general muscle aches at times  Skin: Negative for pallor or rash  pos for hx of skin cancer/ sees dermatology every 6 mo  Neurological: Negative for weakness, light-headedness, numbness and headaches.  Hematological: Negative for adenopathy. Does not bruise/bleed easily.  Psychiatric/Behavioral: Negative for dysphoric mood. The patient is not nervous/anxious.         Objective:   Physical Exam  Constitutional: He appears well-developed and well-nourished. No distress.  HENT:  Head: Normocephalic and atraumatic.  Right Ear: External ear normal.    Left Ear: External ear normal.  Nose: Nose normal.  Mouth/Throat: Oropharynx is clear and moist.  Quit HOH  He does read lips well overall   Eyes: Conjunctivae and EOM are normal. Pupils are equal, round, and reactive to light. Right eye exhibits no discharge. Left eye exhibits no discharge. No scleral icterus.  Neck: Normal range of motion. Neck supple. No JVD present. Carotid bruit is not present. No thyromegaly present.  Cardiovascular: Normal rate, regular rhythm, normal heart sounds and intact distal pulses.  Exam reveals no gallop.   Pulmonary/Chest: Effort normal and breath sounds normal. No respiratory distress. He has no wheezes. He exhibits no tenderness.  Abdominal: Soft. Bowel sounds are normal. He exhibits no distension, no abdominal bruit and no mass. There is no tenderness.  Musculoskeletal: He exhibits no edema or tenderness.  Lymphadenopathy:    He has no cervical adenopathy.  Neurological: He is alert. He has normal reflexes. No cranial nerve deficit. He exhibits normal muscle tone. Coordination normal.  Skin: Skin is warm and dry. No erythema. No pallor.  Scars from surgeries chest/legs -no change Solar lentigos diffusely and solar aging Some SKs   Psychiatric: He has a normal mood and affect.          Assessment & Plan:   Problem List Items Addressed This Visit      Cardiovascular and Mediastinum   HTN (hypertension) - Primary    Better on 2nd check bp in fair control at this time  BP Readings from Last 1 Encounters:  11/09/14 140/85   No changes needed Disc lifstyle change with low sodium diet and exercise   Labs reviewed       Relevant Medications   atorvastatin (LIPITOR) 40 MG tablet   hydrochlorothiazide (HYDRODIURIL) 50 MG tablet   nitroGLYCERIN (NITROLINGUAL) 0.4 MG/SPRAY spray     Nervous and Auditory   Hearing loss    Pt gets by with this and declines further eval or tx         Other   Encounter for Medicare  annual wellness exam     Reviewed health habits including diet and exercise and skin cancer prevention Reviewed appropriate screening tests for age  Also reviewed health mt list, fam hx and immunization status , as well as social and family history   See HPI Labs reviewed  Pt is not interested in colon cancer screening  He is interested in quality of life /less prevention  He declines further f/u with cardiology for CAD Rev his goals for health  Declines further eval or tx of hearing loss Disc hx of falls when hunting - he is ok with the risks and disc fall prec       History of fall    Pt tends to fall when hunting -slips on dry leaves when walking on hills He wears good boots to prevent this  Unwilling to stop hunting or employ other fall precautions at this time       Hyperglycemia    Lab Results  Component Value Date   HGBA1C 6.1 11/02/2014   This has been stable for a while Fair diet/good exercise  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

## 2014-11-14 ENCOUNTER — Other Ambulatory Visit: Payer: Self-pay | Admitting: Family Medicine

## 2015-01-30 DIAGNOSIS — H2513 Age-related nuclear cataract, bilateral: Secondary | ICD-10-CM | POA: Diagnosis not present

## 2015-01-30 DIAGNOSIS — H40013 Open angle with borderline findings, low risk, bilateral: Secondary | ICD-10-CM | POA: Diagnosis not present

## 2015-04-27 DIAGNOSIS — Z23 Encounter for immunization: Secondary | ICD-10-CM | POA: Diagnosis not present

## 2015-06-05 DIAGNOSIS — C44329 Squamous cell carcinoma of skin of other parts of face: Secondary | ICD-10-CM | POA: Diagnosis not present

## 2015-06-05 DIAGNOSIS — C4441 Basal cell carcinoma of skin of scalp and neck: Secondary | ICD-10-CM | POA: Diagnosis not present

## 2015-06-05 DIAGNOSIS — L570 Actinic keratosis: Secondary | ICD-10-CM | POA: Diagnosis not present

## 2015-06-06 ENCOUNTER — Ambulatory Visit (INDEPENDENT_AMBULATORY_CARE_PROVIDER_SITE_OTHER): Payer: Medicare Other | Admitting: Family Medicine

## 2015-06-06 ENCOUNTER — Encounter: Payer: Self-pay | Admitting: Family Medicine

## 2015-06-06 VITALS — BP 138/74 | HR 58 | Temp 97.9°F | Ht 71.0 in | Wt 191.0 lb

## 2015-06-06 DIAGNOSIS — K4090 Unilateral inguinal hernia, without obstruction or gangrene, not specified as recurrent: Secondary | ICD-10-CM | POA: Insufficient documentation

## 2015-06-06 NOTE — Progress Notes (Signed)
   Subjective:    Patient ID: Joel [name], male    DOB: December 25, 1935, 79 y.o.   MRN: [id number]  HPI  79 year old male with history of CAD presents with new onset pain in  Right groin x 6 months.   He reports after digging a hole for a concrete porch, using a pneumatic shovel.. Felt sick to stomach and had right groin pain.  Severe pain Improved for a while but recurred several times.  Right testicle pain started as well.  Now pain is milder ache.  No change with eating. No fever, no redness.  Has also noted right testicle is enlarged " more is in that side then should be"  Pain with lifing leg and crossing legs.   No hematuria, some increase in frequency in past years of urination, no dysuria   Social History /Family History/Past Medical History reviewed and updated if needed. ?  IBS   Review of Systems  Constitutional: Negative for fever and fatigue.  HENT: Negative for ear pain.   Eyes: Negative for pain.  Respiratory: Negative for cough.   Cardiovascular: Negative for chest pain.  Gastrointestinal: Positive for diarrhea.       Objective:   Physical Exam  Constitutional: Vital signs are normal. He appears well-developed and well-nourished.  HENT:  Head: Normocephalic.  Right Ear: Hearing normal.  Left Ear: Hearing normal.  Nose: Nose normal.  Mouth/Throat: Oropharynx is clear and moist and mucous membranes are normal.  Neck: Trachea normal. Carotid bruit is not present. No thyroid mass and no thyromegaly present.  Cardiovascular: Normal rate, regular rhythm and normal pulses.  Exam reveals no gallop, no distant heart sounds and no friction rub.   No murmur heard. No peripheral edema  Pulmonary/Chest: Effort normal and breath sounds normal. No respiratory distress.  Abdominal: A hernia is present. Hernia confirmed positive in the right inguinal area.  Genitourinary: Penis normal. Right testis shows tenderness. Uncircumcised.  Unable to reduce hernia due to pain, no  redness  Lymphadenopathy:       Right: No inguinal adenopathy present.  Skin: Skin is warm, dry and intact. No rash noted.  Psychiatric: He has a normal mood and affect. His speech is normal and behavior is normal. Thought content normal.          Assessment & Plan:

## 2015-06-06 NOTE — Assessment & Plan Note (Signed)
Referral to gen surgery semi-urgent given irreducible . NO sign of incarceration.

## 2015-06-06 NOTE — Progress Notes (Signed)
Pre visit review using our clinic review tool, if applicable. No additional management support is needed unless otherwise documented below in the visit note. 

## 2015-06-06 NOTE — Patient Instructions (Signed)
Stop at front desk for referral. 

## 2015-06-07 ENCOUNTER — Other Ambulatory Visit: Payer: Self-pay | Admitting: Surgery

## 2015-06-07 DIAGNOSIS — L0100 Impetigo, unspecified: Secondary | ICD-10-CM | POA: Diagnosis not present

## 2015-06-07 DIAGNOSIS — K4090 Unilateral inguinal hernia, without obstruction or gangrene, not specified as recurrent: Secondary | ICD-10-CM | POA: Diagnosis not present

## 2015-06-07 DIAGNOSIS — L0291 Cutaneous abscess, unspecified: Secondary | ICD-10-CM | POA: Diagnosis not present

## 2015-07-18 ENCOUNTER — Encounter: Payer: Self-pay | Admitting: Gastroenterology

## 2015-07-24 ENCOUNTER — Encounter: Payer: Self-pay | Admitting: Cardiovascular Disease

## 2015-07-24 ENCOUNTER — Ambulatory Visit (INDEPENDENT_AMBULATORY_CARE_PROVIDER_SITE_OTHER): Payer: Medicare Other | Admitting: Cardiovascular Disease

## 2015-07-24 VITALS — BP 148/90 | HR 66 | Ht 71.0 in | Wt 187.0 lb

## 2015-07-24 DIAGNOSIS — I10 Essential (primary) hypertension: Secondary | ICD-10-CM | POA: Diagnosis not present

## 2015-07-24 DIAGNOSIS — I2510 Atherosclerotic heart disease of native coronary artery without angina pectoris: Secondary | ICD-10-CM | POA: Diagnosis not present

## 2015-07-24 DIAGNOSIS — I25810 Atherosclerosis of coronary artery bypass graft(s) without angina pectoris: Secondary | ICD-10-CM | POA: Diagnosis not present

## 2015-07-24 MED ORDER — ASPIRIN EC 81 MG PO TBEC: 81.0000 mg | DELAYED_RELEASE_TABLET | Freq: Every day | ORAL | Status: DC

## 2015-07-24 NOTE — Progress Notes (Signed)
Cardiology Office Note   Date:  07/24/2015   ID:  Joel [name], DOB Aug 12, 1935, MRN [id number]  PCP:  Loura Pardon, MD  Cardiologist:   Acie Fredrickson Wonda Cheng, MD   Chief Complaint  Patient presents with  . Coronary Artery Disease   Problem list 1. Coronary artery disease-status post CABG in 2001 2. Essential hypertension 3. Hyperlipidemia   History of Present Illness: Joel [name] is a 80 y.o. male who presents for preoperative evaluation prior to hernia surgery. He is a previous patient of Dr. Angelena Form Was seen with his daughter , Joel [name].   Last cath was in 2010 - showed occlusion of his SVT to his posterior branch.  Has some occasional indigestion , relieved with belching . Walks with his dogs regularly.   Deer hunts regularly.   2-3 days a week.  Is out in the woods for 8 hours at a time.  Walks most of that time Still puts a garden in each spring.  Able to walk up multiple flights of stairs without any problems.   Has been getting his lipids checked by Dr. Glori Bickers   Past Medical History  Diagnosis Date  . GERD (gastroesophageal reflux disease)   . Gastritis   . Hyperlipidemia   . History of nephrolithiasis   . Partial tear of rotator cuff(726.13)     Right  . Hypertension   . Coronary artery disease     Post CABG in 2001 with patent grafts in 2010  (90% mid LAD stenosis with competing flow from LIMA distally, 90% OM stenosis with competing flow from SVG,  90% RCA stenosis with competing flow from SVG, occluded SVG to Posterolateral branch).  . Bifascicular block   . Unstable angina [facility])     Past Surgical History  Procedure Laterality Date  . 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 Diffuision of unstable angina  03/2000  . Coronary artery bypass graft  2001  . Back surgery    . Ct abd w & pelvis wo cm  09/2001    Kidney stones  . Nasal sinus surgery  04/2002  . Stress cardiolite  10/2003    No acute changes  . Neck surgery    . Ct abd wo/w & pelvis wo cm   10/14/2007    Low grade obstruction on R by 5mm prox ereteral calculus, Sm bilat renal calculi sm HH  . Pelvic ct  10/14/2007    nml  . Kidney stone surgery  10/2007    Removal R ureteral kidney stone  . Esophagogastroduodenoscopy  08/2008    Mild gastritis and GERD  . Cardiac catheterization  01/2000    Multi vessel PTCA  . Cardiac catheterization  11/2008    multi vesstel dz, re occlusion small vessel     Current Outpatient Prescriptions  Medication Sig Dispense Refill  . aspirin 325 MG EC tablet Take 325 mg by mouth daily.      . diphenhydramine-acetaminophen (TYLENOL PM EXTRA STRENGTH) 25-500 MG TABS Take 1 tablet by mouth at bedtime as needed (AS NEEDED FOR SLEEP).     . hydrochlorothiazide (HYDRODIURIL) 50 MG tablet Take 1 tablet (50 mg total) by mouth daily. 30 tablet 11  . Multiple Vitamin (MULTIVITAMIN) tablet Take 1 tablet by mouth daily.      Marland Kitchen omeprazole (PRILOSEC) 20 MG capsule Take 1 capsule (20 mg total) by mouth daily as needed. 30 capsule 11  . atorvastatin (LIPITOR) 40 MG tablet Take 1 tablet (  40 mg total) by mouth daily. (Patient not taking: Reported on 07/24/2015) 30 tablet 11  . nitroGLYCERIN (NITROLINGUAL) 0.4 MG/SPRAY spray Place 2 sprays under the tongue every 5 (five) minutes as needed for chest pain. (Patient not taking: Reported on 07/24/2015) 12 g 3   No current facility-administered medications for this visit.    Allergies:   Review of patient's allergies indicates no known allergies.    Social History:  The patient  reports that he quit smoking about 16 years ago. He does not have any smokeless tobacco history on file. He reports that he does not drink alcohol or use illicit drugs.   Family History:  The patient's family history includes Alcohol abuse in his father; Coronary artery disease in his mother; Diabetes in his father; Diabetes type II in his father; Heart disease in his father and mother; Transient ischemic attack in his mother.    ROS:   Please see the history of present illness.    Review of Systems: Constitutional:  denies fever, chills, diaphoresis, appetite change and fatigue.  HEENT: denies photophobia, eye pain, redness, hearing loss, ear pain, congestion, sore throat, rhinorrhea, sneezing, neck pain, neck stiffness and tinnitus.  Respiratory: denies SOB, DOE, cough, chest tightness, and wheezing.  Cardiovascular: denies chest pain, palpitations and leg swelling.  Gastrointestinal: denies nausea, vomiting, abdominal pain, diarrhea, constipation, blood in stool.  Genitourinary: denies dysuria, urgency, frequency, hematuria, flank pain and difficulty urinating.  Musculoskeletal: denies  myalgias, back pain, joint swelling, arthralgias and gait problem.   Skin: denies pallor, rash and wound.  Neurological: denies dizziness, seizures, syncope, weakness, light-headedness, numbness and headaches.   Hematological: denies adenopathy, easy bruising, personal or family bleeding history.  Psychiatric/ Behavioral: denies suicidal ideation, mood changes, confusion, nervousness, sleep disturbance and agitation.       All other systems are reviewed and negative.    PHYSICAL EXAM: VS:  BP 148/90 mmHg  [name] 66  Ht 5\' 11"  (1.803 m)  Wt 187 lb (84.823 kg)  BMI 26.09 kg/m2 , BMI Body mass index is 26.09 kg/(m^2). GEN: Well nourished, well developed, in no acute distress HEENT: normal Neck: no JVD, carotid bruits, or masses Cardiac: RRR; no murmurs, rubs, or gallops,no edema  Respiratory:  clear to auscultation bilaterally, normal work of breathing GI: soft, nontender, nondistended, + BS MS: no deformity or atrophy Skin: warm and dry, no rash Neuro:  Strength and sensation are intact Psych: normal   EKG:  EKG is ordered today. The ekg ordered today demonstrates  NSR at 66, RBBB with associated ST changes.  No changes from previous ECG in 2012.     Recent Labs: 11/02/2014: ALT 27; BUN 13; Creatinine, Ser 0.92;  Hemoglobin 15.9; Platelets 202.0; Potassium 3.8; Sodium 142; TSH 1.08    Lipid Panel    Component Value Date/Time   CHOL 132 11/02/2014 0837   TRIG 138.0 11/02/2014 0837   HDL 39.20 11/02/2014 0837   CHOLHDL 3 11/02/2014 0837   VLDL 27.6 11/02/2014 0837   LDLCALC 65 11/02/2014 0837      Wt Readings from Last 3 Encounters:  07/24/15 187 lb (84.823 kg)  06/06/15 191 lb (86.637 kg)  11/09/14 188 lb 8 oz (85.503 kg)      Other studies Reviewed: Additional studies/ records that were reviewed today include: . Review of the above records demonstrates:    ASSESSMENT AND PLAN:  1.  CAD - s/p  CABG He has done very well.   He has no symptoms of  angina or CHF. His ECG is unchanged from previous tracing in 2012. He's able to exercise at a moderate pace for many hours  and is easily able to achieve 4-5 METS.   He  is at low risk for his upcoming hernia repair. He does not need any further testing prior to his hernia surgery .    Current medicines are reviewed at length with the patient today.  The patient does not have concerns regarding medicines.  The following changes have been made:  no change  Labs/ tests ordered today include:  No orders of the defined types were placed in this encounter.     Disposition:   FU with me in 1 year      Mariesha Venturella, Wonda Cheng, MD  07/24/2015 9:17 AM    [address], [place], [place]  [address] Phone: [phone]; Fax: [phone]   [facility]  [address] [place], [place]  [address] [phone]   Fax [phone]

## 2015-07-24 NOTE — Patient Instructions (Signed)
Medication Instructions:  DECREASE Aspirin to 81 mg once daily   Labwork: None Ordered   Testing/Procedures: None Ordered   Follow-Up: Your physician wants you to follow-up in: 1 year with Dr. Nahser.  You will receive a reminder letter in the mail two months in advance. If you don't receive a letter, please call our office to schedule the follow-up appointment.   If you need a refill on your cardiac medications before your next appointment, please call your pharmacy.   Thank you for choosing [facility]! Michelle Swinyer, RN [phone]    

## 2015-07-25 DIAGNOSIS — Z85828 Personal history of other malignant neoplasm of skin: Secondary | ICD-10-CM | POA: Diagnosis not present

## 2015-07-26 ENCOUNTER — Other Ambulatory Visit: Payer: Self-pay | Admitting: Surgery

## 2015-08-19 ENCOUNTER — Other Ambulatory Visit: Payer: Self-pay | Admitting: *Deleted

## 2015-08-19 MED ORDER — HYDROCHLOROTHIAZIDE 50 MG PO TABS: 50.0000 mg | ORAL_TABLET | Freq: Every day | ORAL | Status: DC

## 2015-08-19 NOTE — Telephone Encounter (Signed)
Received fax requesting Rx to be changed to a 90 day supply (cheaper for pt), done

## 2015-09-03 ENCOUNTER — Encounter (HOSPITAL_COMMUNITY)
Admission: RE | Admit: 2015-09-03 | Discharge: 2015-09-03 | Disposition: A | Discharge status: Home / Self Care | Payer: Medicare Other | Source: Ambulatory Visit | Attending: Surgery | Admitting: Surgery

## 2015-09-03 ENCOUNTER — Encounter (HOSPITAL_COMMUNITY): Payer: Self-pay

## 2015-09-03 DIAGNOSIS — E785 Hyperlipidemia, unspecified: Secondary | ICD-10-CM | POA: Diagnosis not present

## 2015-09-03 DIAGNOSIS — I2510 Atherosclerotic heart disease of native coronary artery without angina pectoris: Secondary | ICD-10-CM | POA: Insufficient documentation

## 2015-09-03 DIAGNOSIS — Z01818 Encounter for other preprocedural examination: Secondary | ICD-10-CM | POA: Insufficient documentation

## 2015-09-03 DIAGNOSIS — Z01812 Encounter for preprocedural laboratory examination: Secondary | ICD-10-CM | POA: Insufficient documentation

## 2015-09-03 DIAGNOSIS — I452 Bifascicular block: Secondary | ICD-10-CM | POA: Diagnosis not present

## 2015-09-03 DIAGNOSIS — K219 Gastro-esophageal reflux disease without esophagitis: Secondary | ICD-10-CM | POA: Insufficient documentation

## 2015-09-03 DIAGNOSIS — K4090 Unilateral inguinal hernia, without obstruction or gangrene, not specified as recurrent: Secondary | ICD-10-CM | POA: Insufficient documentation

## 2015-09-03 DIAGNOSIS — Z951 Presence of aortocoronary bypass graft: Secondary | ICD-10-CM | POA: Diagnosis not present

## 2015-09-03 DIAGNOSIS — Z87891 Personal history of nicotine dependence: Secondary | ICD-10-CM | POA: Diagnosis not present

## 2015-09-03 DIAGNOSIS — Z79899 Other long term (current) drug therapy: Secondary | ICD-10-CM | POA: Insufficient documentation

## 2015-09-03 DIAGNOSIS — Z7982 Long term (current) use of aspirin: Secondary | ICD-10-CM | POA: Insufficient documentation

## 2015-09-03 HISTORY — DX: Psoriasis, unspecified: L40.9

## 2015-09-03 HISTORY — DX: Unspecified osteoarthritis, unspecified site: M19.90

## 2015-09-03 LAB — CBC
HCT: 48.9 % (ref 39.0–52.0)
HEMOGLOBIN: 17.2 g/dL — AB (ref 13.0–17.0)
MCH: 32.3 pg (ref 26.0–34.0)
MCHC: 35.2 g/dL (ref 30.0–36.0)
MCV: 91.9 fL (ref 78.0–100.0)
PLATELETS: 197 10*3/uL (ref 150–400)
RBC: 5.32 MIL/uL (ref 4.22–5.81)
RDW: 12.4 % (ref 11.5–15.5)
WBC: 8.2 10*3/uL (ref 4.0–10.5)

## 2015-09-03 LAB — BASIC METABOLIC PANEL
Anion gap: 12 (ref 5–15)
BUN: 12 mg/dL (ref 6–20)
CHLORIDE: 101 mmol/L (ref 101–111)
CO2: 27 mmol/L (ref 22–32)
CREATININE: 0.92 mg/dL (ref 0.61–1.24)
Calcium: 9.3 mg/dL (ref 8.9–10.3)
Glucose, Bld: 122 mg/dL — ABNORMAL HIGH (ref 65–99)
Potassium: 3.5 mmol/L (ref 3.5–5.1)
SODIUM: 140 mmol/L (ref 135–145)

## 2015-09-03 NOTE — Pre-Procedure Instructions (Signed)
Joel [name]  09/03/2015      [id number] - [place], [place] - [address] [place] [address] Phone: [phone] Fax: [phone]    Your procedure is scheduled on Tuesday, March 7th   Report to [facility] Admitting at 5:30 AM             (Posted surgery time 7:30 am - 8:30 am)   Call this number if you have problems the morning of surgery:  [phone]   Remember:  Do not eat food or drink liquids after midnight Monday.  Take these medicines the morning of surgery with A SIP OF WATER : Omeprazole   Do not wear jewelry - no rings or watches.  Do not wear lotions or colognes.   You may NOT wear deodorant the day of surgery.             Men may shave face and neck.   Do not bring valuables to the hospital.  [facility] is not responsible for any belongings or valuables.  Contacts, dentures or bridgework may not be worn into surgery.  Leave your suitcase in the car.  After surgery it may be brought to your room. For patients admitted to the hospital, discharge time will be determined by your treatment team.  Name and phone number of your driver:     Please read over the following fact sheets that you were given. Pain Booklet and Surgical Site Infection Prevention

## 2015-09-03 NOTE — Progress Notes (Signed)
Jan. 2017 EKG  'reads' Bifasicular block.  Pt has seen Dr. Acie Fredrickson, note inside chart.   Sigmund Hazel, PA made aware of findings.

## 2015-09-04 NOTE — Progress Notes (Signed)
Anesthesia Chart Review:  Pt is a 80 year old male scheduled for R inguinal hernia repair with mesh on 09/10/2015 with Dr. Evlyn Courier.   Cardiologist is Dr. Mertie Moores, with clearance for surgery in Epic note dated 07/24/15. PCP is Dr. Loura Pardon.   PMH includes:  CAD (s/p CABG in 2001 with patent grafts in 2010), bifascicular block, hyperlipidemia, GERD. Former smoker. BMI 26.  Medications include: ASA, lipitor, hctz, prilosec.   Preoperative labs reviewed.    EKG 07/24/15: NSR. RBBB. LAFB. Moderate voltage criteria for LVH, may be normal variant.   Cardiac cath 11/15/08:  1. Coronary artery disease status post coronary bypass graft surgery  in 2001. 2. Severe native vessel disease with 70% and 90% mid stenosis in the  left anterior descending, 90% stenosis in the marginal branch of  the circumflex artery, and 90 and 70% stenoses in the distal right  coronary artery. 3. Patent vein graft to distal right coronary artery with 30% proximal  narrowing, patent vein graft to the marginal branch of the  circumflex artery, patent left internal mammary artery graft to the  mid left anterior descending, and occluded vein graft to the  posterolateral branch of the right coronary artery. 4. Slight inferior wall hypokinesis with overall good left ventricular  function with an estimated fraction of 60%.  If no changes, I anticipate pt can proceed with surgery as scheduled.   Willeen Cass, FNP-BC [facility]/Anesthesiology Phone: [phone] 09/04/2015 10:50 AM

## 2015-09-09 NOTE — H&P (Signed)
Joel [name]  Location: [facility] Patient #: [id number] DOB: 04/26/1936 Widowed / Language: Joel [name] / Race: White Male   History of Present Illness Patient words: New-RIH.  The patient is a 80 year old male who presents with an inguinal hernia. This is a pleasant gentleman referred by Dr. Eliezer Lofts for evaluation of a symptomatic right inguinal hernia. The patient believes he has had for several years but after doing heavy lifting back in May he has had intermittent intense pain in the inguinal area. When the event first happened in May he did have nausea but no vomiting. He is still very active and does a lot of heavy lifting. He has had no productive symptoms. He reports the pain is sharp, moderate, intermittent. He is otherwise without complaints.   Other Problems  Anxiety Disorder Arthritis Back Pain Bladder Problems Chest pain Congestive Heart Failure Gastric Ulcer Gastroesophageal Reflux Disease Hemorrhoids High blood pressure Hypercholesterolemia Kidney Stone Myocardial infarction Pulmonary Embolism / Blood Clot in Legs Vascular Disease  Past Surgical History Carotid Artery Surgery Bilateral. Coronary Artery Bypass Graft Spinal Surgery - Neck Tonsillectomy  Diagnostic Studies History  Colonoscopy never  Allergies No Known Drug Allergies12/08/2014  Medication History Aspirin (325MG  Tablet, Oral) Active. Atorvastatin Calcium (40MG  Tablet, Oral) Active. HydroCHLOROthiazide (50MG  Tablet, Oral) Active. Multivitamin Adult (Oral) Active. Nitroglycerin (0.4MG Dorita Fray Solution, Translingual) Active. Omeprazole (20MG  Capsule DR, Oral) Active. Tylenol PM Extra Strength (500-25MG  Tablet, Oral) Active. Medications Reconciled  Social History  Caffeine use Carbonated beverages, Coffee, Tea. No alcohol use No drug use Tobacco use Former smoker.  Family History  Alcohol Abuse Father. Arthritis Mother,  Sister. Cancer Brother, Sister. Cervical Cancer Daughter. Colon Polyps Daughter. Depression Daughter. Heart Disease Father, Mother. Heart disease in male family member before age 31 Heart disease in male family member before age 42 Hypertension Mother, Sister. Ischemic Bowel Disease Daughter. Kidney Disease Daughter.    Review of Systems  General Not Present- Appetite Loss, Chills, Fatigue, Fever, Night Sweats, Weight Gain and Weight Loss. Skin Present- Dryness and Ulcer. Not Present- Change in Wart/Mole, Hives, Jaundice, New Lesions, Non-Healing Wounds and Rash. HEENT Present- Hearing Loss, Ringing in the Ears, Seasonal Allergies and Wears glasses/contact lenses. Not Present- Earache, Hoarseness, Nose Bleed, Oral Ulcers, Sinus Pain, Sore Throat, Visual Disturbances and Yellow Eyes. Breast Not Present- Breast Mass, Breast Pain, Nipple Discharge and Skin Changes. Cardiovascular Present- Leg Cramps. Not Present- Chest Pain, Difficulty Breathing Lying Down, Palpitations, Rapid Heart Rate, Shortness of Breath and Swelling of Extremities. Gastrointestinal Present- Abdominal Pain, Bloating, Excessive gas and Indigestion. Not Present- Bloody Stool, Change in Bowel Habits, Chronic diarrhea, Constipation, Difficulty Swallowing, Gets full quickly at meals, Hemorrhoids, Nausea, Rectal Pain and Vomiting. Male Genitourinary Present- Change in Urinary Stream, Frequency, Impotence, Nocturia and Urgency. Not Present- Blood in Urine, Painful Urination and Urine Leakage. Musculoskeletal Present- Joint Pain, Joint Stiffness, Muscle Pain and Muscle Weakness. Not Present- Back Pain and Swelling of Extremities. Neurological Present- Decreased Memory and Tingling. Not Present- Fainting, Headaches, Numbness, Seizures, Tremor, Trouble walking and Weakness. Psychiatric Present- Anxiety and Fearful. Not Present- Bipolar, Change in Sleep Pattern, Depression and Frequent crying. Endocrine Present- Cold  Intolerance, Excessive Hunger and Heat Intolerance. Not Present- Hair Changes, Hot flashes and New Diabetes. Hematology Present- Easy Bruising. Not Present- Excessive bleeding, Gland problems, HIV and Persistent Infections.  Vitals   Weight: 186.6 lb Height: 71in Body Surface Area: 2.05 m Body Mass Index: 26.03 kg/m  Temp.: 98.42F(Oral)  Pulse: 66 (Regular)  BP: 130/82 (Sitting, Left  Arm, Standard)   Physical Exam General Mental Status-Alert. General Appearance-Consistent with stated age. Hydration-Well hydrated. Voice-Normal.  Head and Neck Head-normocephalic, atraumatic with no lesions or palpable masses. Trachea-midline.  Eye Eyeball - Bilateral-Extraocular movements intact. Sclera/Conjunctiva - Bilateral-No scleral icterus.  Chest and Lung Exam Chest and lung exam reveals -quiet, even and easy respiratory effort with no use of accessory muscles and on auscultation, normal breath sounds, no adventitious sounds and normal vocal resonance. Inspection Chest Wall - Normal. Back - normal.  Cardiovascular Cardiovascular examination reveals -normal heart sounds, regular rate and rhythm with no murmurs and normal pedal pulses bilaterally.  Abdomen Inspection Skin - Scar - no surgical scars. Hernias - Inguinal hernia - Right - Reducible. Note: There is a small, easy to reduce but tender right inguinal hernia. Palpation/Percussion Palpation and Percussion of the abdomen reveal - Soft, Non Tender, No Rebound tenderness, No Rigidity (guarding) and No hepatosplenomegaly. Auscultation Auscultation of the abdomen reveals - Bowel sounds normal.  Neurologic Neurologic evaluation reveals -alert and oriented x 3 with no impairment of recent or remote memory. Mental Status-Normal.  Musculoskeletal Normal Exam - Left-Upper Extremity Strength Normal and Lower Extremity Strength Normal. Normal Exam - Right-Upper Extremity Strength Normal, Lower  Extremity Weakness.    Assessment & Plan  RIGHT INGUINAL HERNIA (K40.90  Impression: I discussed the diagnosis with the patient and his daughter. I am recommending right inguinal hernia repair with mesh. He will need to get cardiac clearance first. I did discuss the surgical procedure with him in detail including the risks. Once he has cardiac clearance, we will schedule his surgery Addendum:  Cardiac clearance achieved.  Will proceed to the OR for right inguinal hernia repair with mesh.  Risks including but not limited to bleeding, infection, injury to surrounding structures, recurrence, cardiopulmonary issues, postop recovery, etc discussed.

## 2015-09-09 NOTE — Anesthesia Preprocedure Evaluation (Addendum)
Anesthesia Evaluation  Patient identified by MRN, date of birth, ID band Patient awake    Reviewed: Allergy & Precautions, NPO status , Patient's Chart, lab work & pertinent test results  Airway Mallampati: II  TM Distance: >3 FB Neck ROM: Full    Dental  (+) Edentulous Upper, Edentulous Lower   Pulmonary former smoker,     + decreased breath sounds      Cardiovascular hypertension, Pt. on medications + angina + CAD and + CABG  + dysrhythmias  Rhythm:Regular Rate:Normal  Post CABG in 2001 with patent grafts in 2010  (90% mid LAD stenosis with competing flow from LIMA distally, 90% OM stenosis with competing flow from SVG,  90% RCA stenosis with competing flow from SVG, occluded SVG to Posterolateral    Neuro/Psych negative neurological ROS  negative psych ROS   GI/Hepatic Neg liver ROS, GERD  Medicated,  Endo/Other  negative endocrine ROS  Renal/GU negative Renal ROS  negative genitourinary   Musculoskeletal  (+) Arthritis ,   Abdominal   Peds negative pediatric ROS (+)  Hematology negative hematology ROS (+)   Anesthesia Other Findings   Reproductive/Obstetrics negative OB ROS                           Lab Results  Component Value Date   WBC 8.2 09/03/2015   HGB 17.2* 09/03/2015   HCT 48.9 09/03/2015   MCV 91.9 09/03/2015   PLT 197 09/03/2015   Lab Results  Component Value Date   CREATININE 0.92 09/03/2015   BUN 12 09/03/2015   NA 140 09/03/2015   K 3.5 09/03/2015   CL 101 09/03/2015   CO2 27 09/03/2015   Lab Results  Component Value Date   INR 1.0 ratio 11/08/2008   07/2015 EKG: normal sinus rhythm, RBBB.   Anesthesia Physical Anesthesia Plan  ASA: III  Anesthesia Plan: General   Post-op Pain Management: GA combined w/ Regional for post-op pain   Induction: Intravenous  Airway Management Planned: Oral ETT  Additional Equipment:   Intra-op Plan:    Post-operative Plan: Extubation in OR  Informed Consent: I have reviewed the patients History and Physical, chart, labs and discussed the procedure including the risks, benefits and alternatives for the proposed anesthesia with the patient or authorized representative who has indicated his/her understanding and acceptance.   Dental advisory given  Plan Discussed with: CRNA  Anesthesia Plan Comments:         Anesthesia Quick Evaluation

## 2015-09-10 ENCOUNTER — Encounter (HOSPITAL_COMMUNITY)
Admission: RE | Disposition: A | Discharge status: Home / Self Care | Payer: Self-pay | Source: Ambulatory Visit | Attending: Surgery

## 2015-09-10 ENCOUNTER — Ambulatory Visit (HOSPITAL_COMMUNITY): Payer: Medicare Other | Admitting: Vascular Surgery

## 2015-09-10 ENCOUNTER — Ambulatory Visit (HOSPITAL_COMMUNITY)
Admission: RE | Admit: 2015-09-10 | Discharge: 2015-09-10 | Disposition: A | Discharge status: Home / Self Care | Payer: Medicare Other | Source: Ambulatory Visit | Attending: Surgery | Admitting: Surgery

## 2015-09-10 ENCOUNTER — Ambulatory Visit (HOSPITAL_COMMUNITY): Payer: Medicare Other | Admitting: Anesthesiology

## 2015-09-10 ENCOUNTER — Encounter (HOSPITAL_COMMUNITY): Payer: Self-pay | Admitting: *Deleted

## 2015-09-10 DIAGNOSIS — Z7982 Long term (current) use of aspirin: Secondary | ICD-10-CM | POA: Insufficient documentation

## 2015-09-10 DIAGNOSIS — I252 Old myocardial infarction: Secondary | ICD-10-CM | POA: Insufficient documentation

## 2015-09-10 DIAGNOSIS — K4090 Unilateral inguinal hernia, without obstruction or gangrene, not specified as recurrent: Secondary | ICD-10-CM | POA: Insufficient documentation

## 2015-09-10 DIAGNOSIS — Z87891 Personal history of nicotine dependence: Secondary | ICD-10-CM | POA: Insufficient documentation

## 2015-09-10 DIAGNOSIS — I2510 Atherosclerotic heart disease of native coronary artery without angina pectoris: Secondary | ICD-10-CM | POA: Diagnosis not present

## 2015-09-10 DIAGNOSIS — G8918 Other acute postprocedural pain: Secondary | ICD-10-CM | POA: Diagnosis not present

## 2015-09-10 DIAGNOSIS — K219 Gastro-esophageal reflux disease without esophagitis: Secondary | ICD-10-CM | POA: Insufficient documentation

## 2015-09-10 DIAGNOSIS — Z955 Presence of coronary angioplasty implant and graft: Secondary | ICD-10-CM | POA: Insufficient documentation

## 2015-09-10 HISTORY — PX: INGUINAL HERNIA REPAIR: SHX194

## 2015-09-10 HISTORY — PX: INSERTION OF MESH: SHX5868

## 2015-09-10 SURGERY — REPAIR, HERNIA, INGUINAL, ADULT
Anesthesia: General | Site: Groin | Laterality: Right

## 2015-09-10 MED ORDER — ROCURONIUM BROMIDE 50 MG/5ML IV SOLN
INTRAVENOUS | Status: AC
  Filled 2015-09-10: qty 1

## 2015-09-10 MED ORDER — LIDOCAINE HCL (CARDIAC) 20 MG/ML IV SOLN
INTRAVENOUS | Status: DC | PRN
  Administered 2015-09-10: 60 mg via INTRAVENOUS

## 2015-09-10 MED ORDER — OXYCODONE HCL 5 MG PO TABS
ORAL_TABLET | ORAL | Status: AC
  Filled 2015-09-10: qty 2

## 2015-09-10 MED ORDER — FENTANYL CITRATE (PF) 250 MCG/5ML IJ SOLN
INTRAMUSCULAR | Status: AC
  Filled 2015-09-10: qty 5

## 2015-09-10 MED ORDER — CEFAZOLIN SODIUM-DEXTROSE 2-3 GM-% IV SOLR
2.0000 g | INTRAVENOUS | Status: AC
Start: 2015-09-10 — End: 2015-09-10
  Administered 2015-09-10: 2 g via INTRAVENOUS

## 2015-09-10 MED ORDER — ONDANSETRON HCL 4 MG/2ML IJ SOLN
INTRAMUSCULAR | Status: AC
  Filled 2015-09-10: qty 2

## 2015-09-10 MED ORDER — 0.9 % SODIUM CHLORIDE (POUR BTL) OPTIME
TOPICAL | Status: DC | PRN
  Administered 2015-09-10: 1000 mL

## 2015-09-10 MED ORDER — HYDROCODONE-ACETAMINOPHEN 5-325 MG PO TABS: 1.0000 | ORAL_TABLET | ORAL | Status: DC | PRN

## 2015-09-10 MED ORDER — FENTANYL CITRATE (PF) 100 MCG/2ML IJ SOLN
INTRAMUSCULAR | Status: AC
  Filled 2015-09-10: qty 2

## 2015-09-10 MED ORDER — FENTANYL CITRATE (PF) 100 MCG/2ML IJ SOLN
25.0000 ug | INTRAMUSCULAR | Status: DC | PRN
  Administered 2015-09-10 (×3): 50 ug via INTRAVENOUS

## 2015-09-10 MED ORDER — BUPIVACAINE-EPINEPHRINE (PF) 0.5% -1:200000 IJ SOLN
INTRAMUSCULAR | Status: AC
  Filled 2015-09-10: qty 30

## 2015-09-10 MED ORDER — MEPERIDINE HCL 25 MG/ML IJ SOLN: 6.2500 mg | INTRAMUSCULAR | Status: DC | PRN

## 2015-09-10 MED ORDER — PROMETHAZINE HCL 25 MG/ML IJ SOLN: 6.2500 mg | INTRAMUSCULAR | Status: DC | PRN

## 2015-09-10 MED ORDER — LIDOCAINE HCL (CARDIAC) 20 MG/ML IV SOLN
INTRAVENOUS | Status: AC
  Filled 2015-09-10: qty 5

## 2015-09-10 MED ORDER — CEFAZOLIN SODIUM-DEXTROSE 2-3 GM-% IV SOLR
INTRAVENOUS | Status: AC
  Filled 2015-09-10: qty 50

## 2015-09-10 MED ORDER — LACTATED RINGERS IV SOLN: INTRAVENOUS | Status: DC

## 2015-09-10 MED ORDER — FENTANYL CITRATE (PF) 100 MCG/2ML IJ SOLN
INTRAMUSCULAR | Status: DC | PRN
  Administered 2015-09-10 (×2): 50 ug via INTRAVENOUS

## 2015-09-10 MED ORDER — PROPOFOL 10 MG/ML IV BOLUS
INTRAVENOUS | Status: AC
  Filled 2015-09-10: qty 20

## 2015-09-10 MED ORDER — PROPOFOL 10 MG/ML IV BOLUS
INTRAVENOUS | Status: DC | PRN
  Administered 2015-09-10: 200 mg via INTRAVENOUS

## 2015-09-10 MED ORDER — BUPIVACAINE-EPINEPHRINE (PF) 0.5% -1:200000 IJ SOLN
INTRAMUSCULAR | Status: DC | PRN
  Administered 2015-09-10: 30 mL

## 2015-09-10 MED ORDER — LACTATED RINGERS IV SOLN
INTRAVENOUS | Status: DC | PRN
Start: 2015-09-10 — End: 2015-09-10
  Administered 2015-09-10: 07:00:00 via INTRAVENOUS

## 2015-09-10 MED ORDER — ONDANSETRON HCL 4 MG/2ML IJ SOLN
INTRAMUSCULAR | Status: DC | PRN
  Administered 2015-09-10: 4 mg via INTRAVENOUS

## 2015-09-10 MED ORDER — OXYCODONE HCL 5 MG PO TABS
5.0000 mg | ORAL_TABLET | ORAL | Status: DC | PRN
  Administered 2015-09-10: 10 mg via ORAL

## 2015-09-10 MED ORDER — BUPIVACAINE-EPINEPHRINE 0.5% -1:200000 IJ SOLN
INTRAMUSCULAR | Status: DC | PRN
Start: 2015-09-10 — End: 2015-09-10
  Administered 2015-09-10: 30 mL

## 2015-09-10 SURGICAL SUPPLY — 42 items
BLADE SURG 10 STRL SS (BLADE) ×3 IMPLANT
BLADE SURG 15 STRL LF DISP TIS (BLADE) ×1 IMPLANT
BLADE SURG 15 STRL SS (BLADE) ×2
BLADE SURG ROTATE 9660 (MISCELLANEOUS) ×3 IMPLANT
CHLORAPREP W/TINT 26ML (MISCELLANEOUS) ×3 IMPLANT
COVER SURGICAL LIGHT HANDLE (MISCELLANEOUS) ×3 IMPLANT
DRAIN PENROSE 1/2X12 LTX STRL (WOUND CARE) ×3 IMPLANT
DRAPE LAPAROTOMY TRNSV 102X78 (DRAPE) ×3 IMPLANT
DRAPE UTILITY XL STRL (DRAPES) ×3 IMPLANT
ELECT CAUTERY BLADE 6.4 (BLADE) ×3 IMPLANT
ELECT REM PT RETURN 9FT ADLT (ELECTROSURGICAL) ×3
ELECTRODE REM PT RTRN 9FT ADLT (ELECTROSURGICAL) ×1 IMPLANT
GLOVE BIO SURGEON STRL SZ 6.5 (GLOVE) ×2 IMPLANT
GLOVE BIO SURGEON STRL SZ7 (GLOVE) ×3 IMPLANT
GLOVE BIO SURGEONS STRL SZ 6.5 (GLOVE) ×1
GLOVE BIOGEL PI IND STRL 7.0 (GLOVE) ×2 IMPLANT
GLOVE BIOGEL PI INDICATOR 7.0 (GLOVE) ×4
GLOVE SS N UNI LF 7.0 STRL (GLOVE) ×3 IMPLANT
GLOVE SURG SIGNA 7.5 PF LTX (GLOVE) ×3 IMPLANT
GOWN STRL REUS W/ TWL LRG LVL3 (GOWN DISPOSABLE) ×3 IMPLANT
GOWN STRL REUS W/ TWL XL LVL3 (GOWN DISPOSABLE) ×1 IMPLANT
GOWN STRL REUS W/TWL LRG LVL3 (GOWN DISPOSABLE) ×6
GOWN STRL REUS W/TWL XL LVL3 (GOWN DISPOSABLE) ×2
KIT BASIN OR (CUSTOM PROCEDURE TRAY) ×3 IMPLANT
KIT ROOM TURNOVER OR (KITS) ×3 IMPLANT
LIQUID BAND (GAUZE/BANDAGES/DRESSINGS) ×3 IMPLANT
MESH PARIETEX PROGRIP RIGHT (Mesh General) ×3 IMPLANT
NEEDLE HYPO 25GX1X1/2 BEV (NEEDLE) ×3 IMPLANT
NS IRRIG 1000ML POUR BTL (IV SOLUTION) ×3 IMPLANT
PACK SURGICAL SETUP 50X90 (CUSTOM PROCEDURE TRAY) ×3 IMPLANT
PAD ARMBOARD 7.5X6 YLW CONV (MISCELLANEOUS) ×3 IMPLANT
PENCIL BUTTON HOLSTER BLD 10FT (ELECTRODE) ×3 IMPLANT
SPONGE LAP 18X18 X RAY DECT (DISPOSABLE) ×3 IMPLANT
SUT MON AB 4-0 PC3 18 (SUTURE) ×3 IMPLANT
SUT SILK 2 0 SH (SUTURE) ×3 IMPLANT
SUT VIC AB 2-0 CT1 27 (SUTURE) ×2
SUT VIC AB 2-0 CT1 TAPERPNT 27 (SUTURE) ×1 IMPLANT
SUT VIC AB 3-0 CT1 27 (SUTURE) ×2
SUT VIC AB 3-0 CT1 TAPERPNT 27 (SUTURE) ×1 IMPLANT
SYR CONTROL 10ML LL (SYRINGE) ×3 IMPLANT
TOWEL OR 17X24 6PK STRL BLUE (TOWEL DISPOSABLE) ×3 IMPLANT
TOWEL OR 17X26 10 PK STRL BLUE (TOWEL DISPOSABLE) ×3 IMPLANT

## 2015-09-10 NOTE — Transfer of Care (Signed)
Immediate Anesthesia Transfer of Care Note  Patient: Joel [name]  Procedure(s) Performed: Procedure(s): RIGHT INGUINAL HERNIA REPAIR WITH MESH (Right) INSERTION OF MESH (Right)  Patient Location: PACU  Anesthesia Type:General  Level of Consciousness: sedated  Airway & Oxygen Therapy: Patient Spontanous Breathing and Patient connected to nasal cannula oxygen  Post-op Assessment: Report given to RN and Post -op Vital signs reviewed and stable  Post vital signs: Reviewed and stable  Last Vitals:  Filed Vitals:   09/10/15 0700  BP: 151/76  Pulse: 56  Temp: 36.6 C  Resp: 18    Complications: No apparent anesthesia complications

## 2015-09-10 NOTE — Anesthesia Postprocedure Evaluation (Signed)
Anesthesia Post Note  Patient: Joel [name]  Procedure(s) Performed: Procedure(s) (LRB): RIGHT INGUINAL HERNIA REPAIR WITH MESH (Right) INSERTION OF MESH (Right)  Patient location during evaluation: PACU Anesthesia Type: General Level of consciousness: awake and alert Pain management: pain level controlled Vital Signs Assessment: post-procedure vital signs reviewed and stable Respiratory status: spontaneous breathing, nonlabored ventilation, respiratory function stable and patient connected to nasal cannula oxygen Cardiovascular status: blood pressure returned to baseline and stable Postop Assessment: no signs of nausea or vomiting Anesthetic complications: no    Last Vitals:  Filed Vitals:   09/10/15 1015 09/10/15 1023  BP: 144/75 143/72  Pulse: 72 55  Temp: 37.3 C   Resp:      Last Pain:  Filed Vitals:   09/10/15 1028  PainSc: 0-No pain                 Effie Berkshire

## 2015-09-10 NOTE — Interval H&P Note (Signed)
History and Physical Interval Note: no change in H and P  09/10/2015 7:11 AM  Joel [name]  has presented today for surgery, with the diagnosis of Right inguinal hernia  The various methods of treatment have been discussed with the patient and family. After consideration of risks, benefits and other options for treatment, the patient has consented to  Procedure(s): RIGHT INGUINAL HERNIA REPAIR WITH MESH (Right) INSERTION OF MESH (Right) as a surgical intervention .  The patient's history has been reviewed, patient examined, no change in status, stable for surgery.  I have reviewed the patient's chart and labs.  Questions were answered to the patient's satisfaction.     Jehu Mccauslin A

## 2015-09-10 NOTE — Op Note (Signed)
RIGHT INGUINAL HERNIA REPAIR WITH MESH, INSERTION OF MESH  Procedure Note  ARUTHER HERFORD 09/10/2015   Pre-op Diagnosis: Right inguinal hernia     Post-op Diagnosis: same  Procedure(s): RIGHT INGUINAL HERNIA REPAIR WITH MESH INSERTION OF MESH  Surgeon(s): Coralie Keens, MD  Anesthesia: General  Staff:  Circulator: Murlean Caller Scrub Person: Jesse Sans, CST  Estimated Blood Loss: Minimal                         Deone Omahoney A   Date: 09/10/2015  Time: 8:09 AM

## 2015-09-10 NOTE — Op Note (Signed)
[name], [name]:  [id number]  MEDICAL RECORD NO.:  [id number]  LOCATION:  [facility]                         FACILITY:  [facility]  PHYSICIAN:  Coralie Keens, M.D. DATE OF BIRTH:  August 11, 1935  DATE OF PROCEDURE:  09/10/2015 DATE OF DISCHARGE:                              OPERATIVE REPORT   PREOPERATIVE DIAGNOSIS:  Right inguinal hernia.  POSTOPERATIVE DIAGNOSIS:  Right inguinal hernia.  PROCEDURE:  Right inguinal hernia repair with mesh.  SURGEON:  Coralie Keens, M.D.  ANESTHESIA:  General with anesthesia applied TAPP block.  ESTIMATED BLOOD LOSS:  Minimal.  FINDINGS:  The patient was found to have an indirect right inguinal hernia which was repaired with a piece of Parietex ProGrip Proceed mesh.  PROCEDURE IN DETAIL:  The patient was brought to the operating room, identified as [name].  He was placed supine on the operating room table and general anesthesia was induced.  His abdomen was then prepped and draped in usual sterile fashion.  I anesthetized the skin of the right lower quadrant with Marcaine and then performed a longitudinal incision with a scalpel.  I took this down through Scarpa fascia with electrocautery.  The external oblique fascia was identified and opened through the internal and external rings.  The testicular cord structures were controlled with Penrose drain.  The patient had a moderate-sized indirect hernia sac which was easily separated from the cord structures. I then opened up the sac and reduced all contents back into the abdominal cavity.  I tied off the base of the sac with 2-0 silk suture. I then excised the redundant sac.  A piece of ProGrip Proceed mesh was then brought into the field.  I placed it as an onlay on the inguinal floor and then brought it around the cord structures.  I then sutured it in place to the pubic tubercle with a 2-0 Vicryl suture.  Wide coverage of the inguinal floor appeared to be achieved  as well as the internal ring.  I then closed the external oblique fascia over top of this with a running 2-0 Vicryl suture.  I then closed the Scarpa fascia with interrupted 3-0 Vicryl sutures and closed the skin with a running 4-0 Monocryl.  Skin glue was then applied.  The patient tolerated the procedure well.  All the counts were correct at the end of the procedure.  The patient was then extubated in the operating room and taken in a stable condition to the recovery room.     Coralie Keens, M.D.     DB/MEDQ  D:  09/10/2015  T:  09/10/2015  Job:  [id number]

## 2015-09-10 NOTE — Anesthesia Procedure Notes (Addendum)
Procedure Name: LMA Insertion Date/Time: 09/10/2015 7:38 AM Performed by: Rush Farmer E Pre-anesthesia Checklist: Patient identified, Emergency Drugs available, Suction available, Patient being monitored and Timeout performed Patient Re-evaluated:Patient Re-evaluated prior to inductionOxygen Delivery Method: Circle system utilized Preoxygenation: Pre-oxygenation with 100% oxygen Intubation Type: IV induction LMA: LMA inserted LMA Size: 4.0 Number of attempts: 1 Placement Confirmation: positive ETCO2 and breath sounds checked- equal and bilateral Tube secured with: Tape Dental Injury: Teeth and Oropharynx as per pre-operative assessment     Anesthesia Regional Block:  TAP block  Pre-Anesthetic Checklist: ,, timeout performed, Correct Patient, Correct Site, Correct Laterality, Correct Procedure, Correct Position, site marked, Risks and benefits discussed,  Surgical consent,  Pre-op evaluation,  At surgeon's request and post-op pain management  Laterality: Right  Prep: chloraprep       Needles:  Injection technique: Single-shot  Needle Type: Echogenic Needle     Needle Length: 9cm 9 cm Needle Gauge: 21 and 21 G    Additional Needles:  Procedures: ultrasound guided (picture in chart) TAP block Narrative:  Start time: 09/10/2015 7:25 AM End time: 09/10/2015 7:28 AM Injection made incrementally with aspirations every 5 mL.  Events: positive IV test  Performed by: Personally  Anesthesiologist: Suella Broad D  Additional Notes: No immediate complications noted.

## 2015-09-10 NOTE — Discharge Instructions (Signed)
[facility] _______[facility]  UMBILICAL OR INGUINAL HERNIA REPAIR: POST OP INSTRUCTIONS  Always review your discharge instruction sheet given to you by the facility where your surgery was performed. IF YOU HAVE DISABILITY OR FAMILY LEAVE FORMS, YOU MUST BRING THEM TO THE OFFICE FOR PROCESSING.   DO NOT GIVE THEM TO YOUR DOCTOR.  1. A  prescription for pain medication may be given to you upon discharge.  Take your pain medication as prescribed, if needed.  If narcotic pain medicine is not needed, then you may take acetaminophen (Tylenol) or ibuprofen (Advil) as needed. 2. Take your usually prescribed medications unless otherwise directed. 3. If you need a refill on your pain medication, please contact your pharmacy.  They will contact our office to request authorization. Prescriptions will not be filled after 5 pm or on week-ends. 4. You should follow a light diet the first 24 hours after arrival home, such as soup and crackers, etc.  Be sure to include lots of fluids daily.  Resume your normal diet the day after surgery. 5. Most patients will experience some swelling and bruising around the umbilicus or in the groin and scrotum.  Ice packs and reclining will help.  Swelling and bruising can take several days to resolve.  6. It is common to experience some constipation if taking pain medication after surgery.  Increasing fluid intake and taking a stool softener (such as Colace) will usually help or prevent this problem from occurring.  A mild laxative (Milk of Magnesia or Miralax) should be taken according to package directions if there are no bowel movements after 48 hours. 7. Unless discharge instructions indicate otherwise, you may remove your bandages 24-48 hours after surgery, and you may shower at that time.  You may have steri-strips (small skin tapes) in place directly over the incision.  These strips should be left on the skin for 7-10 days.  If your surgeon used skin glue on the  incision, you may shower in 24 hours.  The glue will flake off over the next 2-3 weeks.  Any sutures or staples will be removed at the office during your follow-up visit. 8. ACTIVITIES:  You may resume regular (light) daily activities beginning the next day--such as daily self-care, walking, climbing stairs--gradually increasing activities as tolerated.  You may have sexual intercourse when it is comfortable.  Refrain from any heavy lifting or straining until approved by your doctor. a. You may drive when you are no longer taking prescription pain medication, you can comfortably wear a seatbelt, and you can safely maneuver your car and apply brakes. b. RETURN TO WORK:  __________________________________________________________ 9. You should see your doctor in the office for a follow-up appointment approximately 2-3 weeks after your surgery.  Make sure that you call for this appointment within a day or two after you arrive home to insure a convenient appointment time. 10. OTHER INSTRUCTIONS: NO LIFTING MORE THAN 15 TO 20 POUNDS FOR 4 WEEKS 11. ICE PACK AND IBUPROFEN ALSO FOR PAIN 12. OK TO SHOWER TOMORROS __________________________________________________________________________________________________________________________________________________________________________________________  WHEN TO CALL YOUR DOCTOR: 1. Fever over 101.0 2. Inability to urinate 3. Nausea and/or vomiting 4. Extreme swelling or bruising 5. Continued bleeding from incision. 6. Increased pain, redness, or drainage from the incision  The clinic staff is available to answer your questions during regular business hours.  Please dont hesitate to call and ask to speak to one of the nurses for clinical concerns.  If you have a medical emergency, go to  the nearest emergency room or call 911.  A surgeon from [facility] is always on call at the hospital   [address], [place], [place], [place]  [address]  ?  [address], [place], [place]   [address] [phone] ? [phone] ? FAX [phone] Web site: [email or web address]

## 2015-09-11 ENCOUNTER — Encounter (HOSPITAL_COMMUNITY): Payer: Self-pay | Admitting: Surgery

## 2015-09-11 LAB — HEMOGLOBIN A1C
HEMOGLOBIN A1C: 5.9 % — AB (ref 4.8–5.6)
Mean Plasma Glucose: 123 mg/dL

## 2015-10-11 ENCOUNTER — Encounter (HOSPITAL_COMMUNITY): Payer: Self-pay | Admitting: Surgery

## 2015-10-23 DIAGNOSIS — Z85828 Personal history of other malignant neoplasm of skin: Secondary | ICD-10-CM | POA: Diagnosis not present

## 2015-10-23 DIAGNOSIS — L308 Other specified dermatitis: Secondary | ICD-10-CM | POA: Diagnosis not present

## 2015-10-23 DIAGNOSIS — L570 Actinic keratosis: Secondary | ICD-10-CM | POA: Diagnosis not present

## 2015-11-03 ENCOUNTER — Telehealth: Payer: Self-pay | Admitting: Family Medicine

## 2015-11-03 DIAGNOSIS — R739 Hyperglycemia, unspecified: Secondary | ICD-10-CM

## 2015-11-03 DIAGNOSIS — E785 Hyperlipidemia, unspecified: Secondary | ICD-10-CM

## 2015-11-03 DIAGNOSIS — I10 Essential (primary) hypertension: Secondary | ICD-10-CM

## 2015-11-03 NOTE — Telephone Encounter (Signed)
-----   Message from Ellamae Sia sent at 10/30/2015  3:02 PM EDT ----- Regarding: Lab orders for Thursday, 5.4.17 Patient is scheduled for CPX labs, please order future labs, Thanks , Karna Christmas

## 2015-11-07 ENCOUNTER — Other Ambulatory Visit (INDEPENDENT_AMBULATORY_CARE_PROVIDER_SITE_OTHER): Payer: Medicare Other

## 2015-11-07 DIAGNOSIS — E785 Hyperlipidemia, unspecified: Secondary | ICD-10-CM | POA: Diagnosis not present

## 2015-11-07 DIAGNOSIS — R739 Hyperglycemia, unspecified: Secondary | ICD-10-CM | POA: Diagnosis not present

## 2015-11-07 DIAGNOSIS — I10 Essential (primary) hypertension: Secondary | ICD-10-CM

## 2015-11-07 LAB — LIPID PANEL
CHOLESTEROL: 194 mg/dL (ref 0–200)
HDL: 41.4 mg/dL (ref >39.00)
LDL Cholesterol: 128 mg/dL — ABNORMAL HIGH (ref 0–99)
NonHDL: 152.56
TRIGLYCERIDES: 123 mg/dL (ref 0.0–149.0)
Total CHOL/HDL Ratio: 5
VLDL: 24.6 mg/dL (ref 0.0–40.0)

## 2015-11-07 LAB — CBC WITH DIFFERENTIAL/PLATELET
BASOS ABS: 0 10*3/uL (ref 0.0–0.1)
BASOS PCT: 0.4 % (ref 0.0–3.0)
EOS ABS: 0.4 10*3/uL (ref 0.0–0.7)
Eosinophils Relative: 4.4 % (ref 0.0–5.0)
HEMATOCRIT: 48.5 % (ref 39.0–52.0)
HEMOGLOBIN: 16.4 g/dL (ref 13.0–17.0)
LYMPHS PCT: 24.8 % (ref 12.0–46.0)
Lymphs Abs: 2.2 10*3/uL (ref 0.7–4.0)
MCHC: 33.8 g/dL (ref 30.0–36.0)
MCV: 92 fl (ref 78.0–100.0)
MONOS PCT: 9.2 % (ref 3.0–12.0)
Monocytes Absolute: 0.8 10*3/uL (ref 0.1–1.0)
NEUTROS ABS: 5.3 10*3/uL (ref 1.4–7.7)
Neutrophils Relative %: 61.2 % (ref 43.0–77.0)
PLATELETS: 218 10*3/uL (ref 150.0–400.0)
RBC: 5.27 Mil/uL (ref 4.22–5.81)
RDW: 13.3 % (ref 11.5–15.5)
WBC: 8.7 10*3/uL (ref 4.0–10.5)

## 2015-11-07 LAB — COMPREHENSIVE METABOLIC PANEL
ALBUMIN: 4.1 g/dL (ref 3.5–5.2)
ALT: 31 U/L (ref 0–53)
AST: 24 U/L (ref 0–37)
Alkaline Phosphatase: 75 U/L (ref 39–117)
BILIRUBIN TOTAL: 1 mg/dL (ref 0.2–1.2)
BUN: 16 mg/dL (ref 6–23)
CALCIUM: 9.3 mg/dL (ref 8.4–10.5)
CO2: 30 meq/L (ref 19–32)
CREATININE: 0.93 mg/dL (ref 0.40–1.50)
Chloride: 102 mEq/L (ref 96–112)
GFR: 83.6 mL/min (ref >60.00)
Glucose, Bld: 112 mg/dL — ABNORMAL HIGH (ref 70–99)
Potassium: 3.8 mEq/L (ref 3.5–5.1)
Sodium: 139 mEq/L (ref 135–145)
Total Protein: 6.4 g/dL (ref 6.0–8.3)

## 2015-11-07 LAB — TSH: TSH: 1.59 u[IU]/mL (ref 0.35–4.50)

## 2015-11-07 LAB — HEMOGLOBIN A1C: HEMOGLOBIN A1C: 6.1 % (ref 4.6–6.5)

## 2015-11-12 ENCOUNTER — Encounter: Payer: Self-pay | Admitting: Family Medicine

## 2015-11-12 ENCOUNTER — Ambulatory Visit (INDEPENDENT_AMBULATORY_CARE_PROVIDER_SITE_OTHER): Payer: Medicare Other | Admitting: Family Medicine

## 2015-11-12 VITALS — BP 122/82 | HR 59 | Temp 98.7°F | Ht 70.5 in | Wt 184.8 lb

## 2015-11-12 DIAGNOSIS — I10 Essential (primary) hypertension: Secondary | ICD-10-CM | POA: Diagnosis not present

## 2015-11-12 DIAGNOSIS — I25810 Atherosclerosis of coronary artery bypass graft(s) without angina pectoris: Secondary | ICD-10-CM

## 2015-11-12 DIAGNOSIS — R739 Hyperglycemia, unspecified: Secondary | ICD-10-CM | POA: Diagnosis not present

## 2015-11-12 DIAGNOSIS — E785 Hyperlipidemia, unspecified: Secondary | ICD-10-CM | POA: Diagnosis not present

## 2015-11-12 DIAGNOSIS — H9193 Unspecified hearing loss, bilateral: Secondary | ICD-10-CM

## 2015-11-12 DIAGNOSIS — Z9181 History of falling: Secondary | ICD-10-CM | POA: Diagnosis not present

## 2015-11-12 DIAGNOSIS — Z0000 Encounter for general adult medical examination without abnormal findings: Secondary | ICD-10-CM

## 2015-11-12 MED ORDER — HYDROCHLOROTHIAZIDE 50 MG PO TABS: 50.0000 mg | ORAL_TABLET | Freq: Every day | ORAL | Status: DC

## 2015-11-12 MED ORDER — OMEPRAZOLE 20 MG PO CPDR: 20.0000 mg | DELAYED_RELEASE_CAPSULE | Freq: Every day | ORAL | Status: DC | PRN

## 2015-11-12 NOTE — Assessment & Plan Note (Signed)
bp in fair control at this time  BP Readings from Last 1 Encounters:  11/12/15 122/82   No changes needed Disc lifstyle change with low sodium diet and exercise  Labs reviewed

## 2015-11-12 NOTE — Assessment & Plan Note (Signed)
Pt did not tolerate hearing aide years ago-not ready to try it again

## 2015-11-12 NOTE — Assessment & Plan Note (Signed)
Reviewed health habits including diet and exercise and skin cancer prevention Reviewed appropriate screening tests for age  Also reviewed health mt list, fam hx and immunization status , as well as social and family history   See HPI Labs reviewed  Stay active - I'm glad you are doing so well  Labs are fairly stable Try to cut sugar in diet to prevent diabetes  Also for cholesterol (Avoid red meat/ fried foods/ egg yolks/ fatty breakfast meats/ butter, cheese and high fat dairy/ and shellfish) - cholesterol is up some as expected off the medication  Take care of yourself  Be very careful not to fall  Fall precautions discussed

## 2015-11-12 NOTE — Progress Notes (Signed)
Pre visit review using our clinic review tool, if applicable. No additional management support is needed unless otherwise documented below in the visit note. 

## 2015-11-12 NOTE — Assessment & Plan Note (Signed)
Pt has balance issues from old injury as well as age related  Disc imp of fall precautions  Will avoid slippery surfaces No fractures

## 2015-11-12 NOTE — Assessment & Plan Note (Signed)
Disc goals for lipids and reasons to control them Rev labs with pt Rev low sat fat diet in detail Intol of statin (muscle pain with atorvastatin)- some inc in LDL  Has had f/u with cardiology

## 2015-11-12 NOTE — Progress Notes (Signed)
Subjective:    Patient ID: Joel [name], male    DOB: 04-14-1936, 80 y.o.   MRN: [id number]  HPI  Here for annual medicare wellness visit as well as chronic/acute medical problems   I have personally reviewed the Medicare Annual Wellness questionnaire and have noted 1. The patient's medical and social history 2. Their use of alcohol, tobacco or illicit drugs 3. Their current medications and supplements 4. The patient's functional ability including ADL's, fall risks, home safety risks and hearing or visual             impairment. 5. Diet and physical activities 6. Evidence for depression or mood disorders  The patients weight, height, BMI have been recorded in the chart and visual acuity is per eye clinic.  I have made referrals, counseling and provided education to the patient based review of the above and I have provided the pt with a written personalized care plan for preventive services. Reviewed and updated provider list, see scanned forms.  Wt is down 1 lb with bmi of 26 Eats pretty healthy - cooks what he likes   See scanned forms.  Routine anticipatory guidance given to patient.  See health maintenance. Colon cancer screening- declines any  Hearing - tried a hearing aide 4-5 years ago - did not like it at all - has not changed his mind about another one yet (may go to the [place] in the past) Flu vaccine- 10 /16  Tetanus vaccine 5/14  Pneumovax--complete on those  Zoster vaccine--does not think he can afford it / does not want it at the current time  Prostate cancer screening- sees urologist , actually getting up less at night / symptoms had improved Advance directive- has a living will and POA (daughter) Cognitive function addressed- see scanned forms- and if abnormal then additional documentation follows. - he has short term memory problems (has had head injury in the past- that made it worse earlier)-no recent changes (he lives alone and no problems with ADLs at all) No  confusion or family concerns about it   PMH and SH reviewed  Meds, vitals, and allergies reviewed.   ROS: See HPI.  Otherwise negative.    Hx of CAD-pt does not want to return to cardiology  He did have to go - in January (dr Cathie Olden) - had to have clearance for his hernia surgery   Had hernia surgery - he still has some discomfort in the area (2 months ago) Says a hard area where the surgery was   Still hunts regularly and is very very active  He still does some rock work     Hyperglycemia Lab Results  Component Value Date   HGBA1C 6.1 11/07/2015  this is up to 5.9 He eats ice cream /not a lot of other sweets    Hyperlipidemia Lab Results  Component Value Date   CHOL 194 11/07/2015   CHOL 132 11/02/2014   CHOL 128 10/30/2013   Lab Results  Component Value Date   HDL 41.40 11/07/2015   HDL 39.20 11/02/2014   HDL 30.70* 10/30/2013   Lab Results  Component Value Date   LDLCALC 128* 11/07/2015   LDLCALC 65 11/02/2014   LDLCALC 79 10/30/2013   Lab Results  Component Value Date   TRIG 123.0 11/07/2015   TRIG 138.0 11/02/2014   TRIG 94.0 10/30/2013   Lab Results  Component Value Date   CHOLHDL 5 11/07/2015   CHOLHDL 3 11/02/2014   CHOLHDL 4 10/30/2013  No results found for: LDLDIRECT  He was prev on lipitor- had aches and pains and stopped it  Feels better after stopping it  Does not want further medication for cholesterol  Diet is fair   BP Readings from Last 3 Encounters:  11/12/15 140/82  09/10/15 143/72  09/03/15 160/96    Improved on 2nd check today BP: 122/82 mmHg    Patient Active Problem List   Diagnosis Date Noted  . History of fall 11/09/2014  . Encounter for Medicare annual wellness exam 11/06/2013  . Skin lesion of scalp 05/09/2013  . Prostate cancer screening 05/09/2013  . Laceration of arm 11/11/2012  . Hyperglycemia 07/31/2011  . HTN (hypertension) 06/16/2011  . Night muscle spasms 01/21/2011  . CAD, AUTOLOGOUS BYPASS GRAFT  11/07/2008  . FLATULENCE-GAS-BLOATING 08/02/2008  . RUQ PAIN 07/10/2008  . URINARY CALCULUS 10/17/2007  . Coronary atherosclerosis 10/03/2007  . Hyperlipidemia 04/13/2007  . Hearing loss 04/13/2007  . RHINITIS, CHRONIC 04/13/2007  . GERD 04/13/2007  . Actinic keratosis 04/13/2007  . NEPHROLITHIASIS, HX OF 04/13/2007   Past Medical History  Diagnosis Date  . GERD (gastroesophageal reflux disease)   . Gastritis   . Hyperlipidemia   . History of nephrolithiasis   . Partial tear of rotator cuff(726.13)     Right  . Hypertension   . Coronary artery disease     Post CABG in 2001 with patent grafts in 2010  (90% mid LAD stenosis with competing flow from LIMA distally, 90% OM stenosis with competing flow from SVG,  90% RCA stenosis with competing flow from SVG, occluded SVG to Posterolateral branch).  . Bifascicular block   . Unstable angina ([place])   . Arthritis   . Psoriasis    Past Surgical History  Procedure Laterality Date  . 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 Diffuision of unstable angina  03/2000  . Coronary artery bypass graft  2001  . Back surgery    . Ct abd w & pelvis wo cm  09/2001    Kidney stones  . Nasal sinus surgery  04/2002  . Stress cardiolite  10/2003    No acute changes  . Neck surgery    . Ct abd wo/w & pelvis wo cm  10/14/2007    Low grade obstruction on R by 92mm prox ereteral calculus, Sm bilat renal calculi sm HH  . Pelvic ct  10/14/2007    nml  . Kidney stone surgery  10/2007    Removal R ureteral kidney stone  . Esophagogastroduodenoscopy  08/2008    Mild gastritis and GERD  . Cardiac catheterization  01/2000    Multi vessel PTCA  . Cardiac catheterization  11/2008    multi vesstel dz, re occlusion small vessel  . Tonsillectomy    . Inguinal hernia repair Right 09/10/2015    Procedure: RIGHT INGUINAL HERNIA REPAIR WITH MESH;  Surgeon: Coralie Keens, MD;  Location: [place];  Service: General;  Laterality: Right;  . Insertion of mesh Right  09/10/2015    Procedure: INSERTION OF MESH;  Surgeon: Coralie Keens, MD;  Location: [place];  Service: General;  Laterality: Right;   Social History  Substance Use Topics  . Smoking status: Former Smoker    Quit date: 12/05/1999  . Smokeless tobacco: None  . Alcohol Use: No   Family History  Problem Relation Age of Onset  . Transient ischemic attack Mother   . Coronary artery disease Mother   . Heart disease Mother  CAD  . Alcohol abuse Father   . Diabetes type II Father   . Diabetes Father   . Heart disease Father     pacemaker   No Known Allergies Current Outpatient Prescriptions on File Prior to Visit  Medication Sig Dispense Refill  . aspirin EC 81 MG tablet Take 1 tablet (81 mg total) by mouth daily.    . diphenhydramine-acetaminophen (TYLENOL PM EXTRA STRENGTH) 25-500 MG TABS Take 1 tablet by mouth at bedtime as needed (AS NEEDED FOR SLEEP).     . Multiple Vitamin (MULTIVITAMIN) tablet Take 1 tablet by mouth daily.       No current facility-administered medications on file prior to visit.     Review of Systems Review of Systems  Constitutional: Negative for fever, appetite change, fatigue and unexpected weight change.  Eyes: Negative for pain and visual disturbance.  ENT pos for chronic sinus congestion -worse in allergy season  Respiratory: Negative for cough and shortness of breath.   Cardiovascular: Negative for cp or palpitations    Gastrointestinal: Negative for nausea, diarrhea and constipation.  Genitourinary: Negative for urgency and frequency.  Skin: Negative for pallor or rash   Neurological: Negative for weakness, light-headedness, numbness and headaches.  Hematological: Negative for adenopathy. Does not bruise/bleed easily.  Psychiatric/Behavioral: Negative for dysphoric mood. The patient is not nervous/anxious.         Objective:   Physical Exam  Constitutional: He appears well-developed and well-nourished. No distress.  Well appearing elderly  male  HENT:  Head: Normocephalic and atraumatic.  Right Ear: External ear normal.  Left Ear: External ear normal.  Nose: Nose normal.  Mouth/Throat: Oropharynx is clear and moist.  Very HOH- speaks very loudly  Eyes: Conjunctivae and EOM are normal. Pupils are equal, round, and reactive to light. Right eye exhibits no discharge. Left eye exhibits no discharge. No scleral icterus.  Neck: Normal range of motion. Neck supple. No JVD present. Carotid bruit is not present. No thyromegaly present.  Cardiovascular: Normal rate, regular rhythm, normal heart sounds and intact distal pulses.  Exam reveals no gallop.   Pulmonary/Chest: Effort normal and breath sounds normal. No respiratory distress. He has no wheezes. He exhibits no tenderness.  Abdominal: Soft. Bowel sounds are normal. He exhibits no distension, no abdominal bruit and no mass. There is no tenderness.  Musculoskeletal: He exhibits no edema or tenderness.  Lymphadenopathy:    He has no cervical adenopathy.  Neurological: He is alert. He has normal reflexes. No cranial nerve deficit. He exhibits normal muscle tone. Coordination normal.  Skin: Skin is warm and dry. No rash noted. No erythema. No pallor.  Many AKs and scars from prev ones as well as solar aging   Psychiatric: He has a normal mood and affect.          Assessment & Plan:   Problem List Items Addressed This Visit      Cardiovascular and Mediastinum   HTN (hypertension) - Primary    bp in fair control at this time  BP Readings from Last 1 Encounters:  11/12/15 122/82   No changes needed Disc lifstyle change with low sodium diet and exercise  Labs reviewed        Relevant Medications   hydrochlorothiazide (HYDRODIURIL) 50 MG tablet     Other   Encounter for Medicare annual wellness exam    Reviewed health habits including diet and exercise and skin cancer prevention Reviewed appropriate screening tests for age  Also reviewed health mt  list, fam hx and  immunization status , as well as social and family history   See HPI Labs reviewed  Stay active - I'm glad you are doing so well  Labs are fairly stable Try to cut sugar in diet to prevent diabetes  Also for cholesterol (Avoid red meat/ fried foods/ egg yolks/ fatty breakfast meats/ butter, cheese and high fat dairy/ and shellfish) - cholesterol is up some as expected off the medication  Take care of yourself  Be very careful not to fall  Fall precautions discussed      History of fall    Pt has balance issues from old injury as well as age related  Disc imp of fall precautions  Will avoid slippery surfaces No fractures       Hyperglycemia   Hyperlipidemia    Disc goals for lipids and reasons to control them Rev labs with pt Rev low sat fat diet in detail Intol of statin (muscle pain with atorvastatin)- some inc in LDL  Has had f/u with cardiology      Relevant Medications   hydrochlorothiazide (HYDRODIURIL) 50 MG tablet

## 2015-11-12 NOTE — Patient Instructions (Signed)
Stay active - I'm glad you are doing so well  Labs are fairly stable Try to cut sugar in diet to prevent diabetes  Also for cholesterol (Avoid red meat/ fried foods/ egg yolks/ fatty breakfast meats/ butter, cheese and high fat dairy/ and shellfish) - cholesterol is up some as expected off the medication  Take care of yourself  Be very careful not to fall

## 2015-12-10 ENCOUNTER — Other Ambulatory Visit: Payer: Self-pay | Admitting: Family Medicine

## 2015-12-30 ENCOUNTER — Ambulatory Visit (INDEPENDENT_AMBULATORY_CARE_PROVIDER_SITE_OTHER): Payer: Medicare Other | Admitting: Internal Medicine

## 2015-12-30 ENCOUNTER — Encounter: Payer: Self-pay | Admitting: Internal Medicine

## 2015-12-30 VITALS — BP 120/80 | HR 64 | Temp 98.8°F | Wt 187.0 lb

## 2015-12-30 DIAGNOSIS — I25810 Atherosclerosis of coronary artery bypass graft(s) without angina pectoris: Secondary | ICD-10-CM | POA: Diagnosis not present

## 2015-12-30 DIAGNOSIS — J069 Acute upper respiratory infection, unspecified: Secondary | ICD-10-CM

## 2015-12-30 MED ORDER — HYDROCOD POLST-CPM POLST ER 10-8 MG/5ML PO SUER: 5.0000 mL | Freq: Every evening | ORAL | Status: DC | PRN

## 2015-12-30 MED ORDER — AZITHROMYCIN 250 MG PO TABS: ORAL_TABLET | ORAL | Status: DC

## 2015-12-30 NOTE — Progress Notes (Signed)
HPI  Pt presents to the clinic today with c/o runny nose, sore throat and cough. This started 4 days ago. He is blowing clear mucous out of his nose. The cough is productive of thick grey mucous. He sides hurt from coughing. He denies chest pain but has been a little short of breath. He has run low grade fevers but denies chills or body aches. He has no history of seasonal allergies or breathing problems. He is former smoker. He has not had sick contacts. He is UTD with his flu and pneumonia vaccines.  Review of Systems      Past Medical History  Diagnosis Date  . GERD (gastroesophageal reflux disease)   . Gastritis   . Hyperlipidemia   . History of nephrolithiasis   . Partial tear of rotator cuff(726.13)     Right  . Hypertension   . Coronary artery disease     Post CABG in 2001 with patent grafts in 2010  (90% mid LAD stenosis with competing flow from LIMA distally, 90% OM stenosis with competing flow from SVG,  90% RCA stenosis with competing flow from SVG, occluded SVG to Posterolateral branch).  . Bifascicular block   . Unstable angina ([place])   . Arthritis   . Psoriasis     Family History  Problem Relation Age of Onset  . Transient ischemic attack Mother   . Coronary artery disease Mother   . Heart disease Mother     CAD  . Alcohol abuse Father   . Diabetes type II Father   . Diabetes Father   . Heart disease Father     pacemaker    Social History   Social History  . Marital Status: Widowed    Spouse Name: N/A  . Number of Children: N/A  . Years of Education: N/A   Occupational History  . Retired Dispensing optician work    Social History Main Topics  . Smoking status: Former Smoker    Quit date: 12/05/1999  . Smokeless tobacco: Not on file  . Alcohol Use: No  . Drug Use: No  . Sexual Activity: Not on file   Other Topics Concern  . Not on file   Social History Narrative   1 half brother, 3 half sisters   Wife died last year of lung cancer   2 children   Fishes  and walks dog for exercise      Field seismologist Release form and gives Kathe Mariner, daughter [phone]. Can leave msg on ans machine    No Known Allergies   Constitutional: Positive fatigue and fever. Denies headache, abrupt weight changes.  HEENT:  Positive runny nose, sore throat. Denies eye redness, eye pain, pressure behind the eyes, facial pain, nasal congestion, ear pain, ringing in the ears, wax buildup, runny nose or bloody nose. Respiratory: Positive cough and shortness of breath. Denies difficulty breathing.  Cardiovascular: Denies chest pain, chest tightness, palpitations or swelling in the hands or feet.   No other specific complaints in a complete review of systems (except as listed in HPI above).  Objective:   BP 120/80 mmHg  Pulse 64  Temp(Src) 98.8 F (37.1 C)  Wt 187 lb (84.823 kg) Wt Readings from Last 3 Encounters:  12/30/15 187 lb (84.823 kg)  11/12/15 184 lb 12 oz (83.802 kg)  09/03/15 185 lb 4 oz (84.029 kg)     General: Appears his stated age, in NAD. HEENT: Head: normal shape and size, maxillary sinus tenderness noted; Eyes: sclera  white, no icterus, conjunctiva pink; Ears: Tm's pink and intact, normal light reflex, + serous effusion bilaterally; Nose: mucosa pink and moist, septum midline; Throat/Mouth: + PND. Teeth present, mucosa pink and moist, no exudate noted, no lesions or ulcerations noted.  Neck: No cervical lymphadenopathy.  Cardiovascular: Normal rate and rhythm. Pulmonary/Chest: Normal effort and scattered fine rhonchi throughout. No respiratory distress. No wheezes, rales noted.      Assessment & Plan:   Upper Respiratory Infection:  Flonase 1 spray each nostril, daily x 1 week Get some rest and drink plenty of water Do salt water gargles for the sore throat eRx for Azithromax x 5 days Rx for Tussionex cough syrup  RTC as needed or if symptoms persist. Webb Silversmith, NP

## 2015-12-30 NOTE — Patient Instructions (Signed)

## 2016-04-29 DIAGNOSIS — Z23 Encounter for immunization: Secondary | ICD-10-CM | POA: Diagnosis not present

## 2016-11-09 ENCOUNTER — Telehealth: Payer: Self-pay | Admitting: Family Medicine

## 2016-11-09 DIAGNOSIS — Z0000 Encounter for general adult medical examination without abnormal findings: Secondary | ICD-10-CM | POA: Insufficient documentation

## 2016-11-09 DIAGNOSIS — R739 Hyperglycemia, unspecified: Secondary | ICD-10-CM

## 2016-11-09 NOTE — Telephone Encounter (Signed)
-----   Message from Joel [name] sent at 11/09/2016  2:55 PM EDT ----- Regarding: Lab orders for Tuesday, 5.15.18 Patient is scheduled for CPX labs, please order future labs, Thanks , Karna Christmas

## 2016-11-15 ENCOUNTER — Other Ambulatory Visit: Payer: Self-pay | Admitting: Family Medicine

## 2016-11-17 ENCOUNTER — Ambulatory Visit (INDEPENDENT_AMBULATORY_CARE_PROVIDER_SITE_OTHER): Payer: Medicare Other

## 2016-11-17 ENCOUNTER — Other Ambulatory Visit: Payer: Medicare Other

## 2016-11-17 VITALS — BP 140/100 | HR 58 | Temp 98.1°F | Ht 70.5 in | Wt 177.2 lb

## 2016-11-17 DIAGNOSIS — R739 Hyperglycemia, unspecified: Secondary | ICD-10-CM | POA: Diagnosis not present

## 2016-11-17 DIAGNOSIS — Z0000 Encounter for general adult medical examination without abnormal findings: Secondary | ICD-10-CM

## 2016-11-17 LAB — COMPREHENSIVE METABOLIC PANEL
ALBUMIN: 4.5 g/dL (ref 3.5–5.2)
ALK PHOS: 81 U/L (ref 39–117)
ALT: 31 U/L (ref 0–53)
AST: 23 U/L (ref 0–37)
BUN: 16 mg/dL (ref 6–23)
CHLORIDE: 103 meq/L (ref 96–112)
CO2: 32 mEq/L (ref 19–32)
Calcium: 9.8 mg/dL (ref 8.4–10.5)
Creatinine, Ser: 0.99 mg/dL (ref 0.40–1.50)
GFR: 77.57 mL/min (ref >60.00)
GLUCOSE: 124 mg/dL — AB (ref 70–99)
POTASSIUM: 4.9 meq/L (ref 3.5–5.1)
Sodium: 140 mEq/L (ref 135–145)
TOTAL PROTEIN: 6.8 g/dL (ref 6.0–8.3)
Total Bilirubin: 1.6 mg/dL — ABNORMAL HIGH (ref 0.2–1.2)

## 2016-11-17 LAB — CBC WITH DIFFERENTIAL/PLATELET
BASOS PCT: 0.4 % (ref 0.0–3.0)
Basophils Absolute: 0 10*3/uL (ref 0.0–0.1)
EOS PCT: 3.4 % (ref 0.0–5.0)
Eosinophils Absolute: 0.3 10*3/uL (ref 0.0–0.7)
HCT: 50.1 % (ref 39.0–52.0)
Hemoglobin: 17.2 g/dL — ABNORMAL HIGH (ref 13.0–17.0)
LYMPHS ABS: 2.6 10*3/uL (ref 0.7–4.0)
Lymphocytes Relative: 28.1 % (ref 12.0–46.0)
MCHC: 34.3 g/dL (ref 30.0–36.0)
MCV: 93.6 fl (ref 78.0–100.0)
MONOS PCT: 10.6 % (ref 3.0–12.0)
Monocytes Absolute: 1 10*3/uL (ref 0.1–1.0)
NEUTROS PCT: 57.5 % (ref 43.0–77.0)
Neutro Abs: 5.4 10*3/uL (ref 1.4–7.7)
Platelets: 213 10*3/uL (ref 150.0–400.0)
RBC: 5.35 Mil/uL (ref 4.22–5.81)
RDW: 12.7 % (ref 11.5–15.5)
WBC: 9.4 10*3/uL (ref 4.0–10.5)

## 2016-11-17 LAB — HEMOGLOBIN A1C: Hgb A1c MFr Bld: 6 % (ref 4.6–6.5)

## 2016-11-17 LAB — LIPID PANEL
Cholesterol: 228 mg/dL — ABNORMAL HIGH (ref 0–200)
HDL: 53.9 mg/dL (ref >39.00)
LDL Cholesterol: 155 mg/dL — ABNORMAL HIGH (ref 0–99)
NonHDL: 174.16
Total CHOL/HDL Ratio: 4
Triglycerides: 98 mg/dL (ref 0.0–149.0)
VLDL: 19.6 mg/dL (ref 0.0–40.0)

## 2016-11-17 LAB — TSH: TSH: 1.34 u[IU]/mL (ref 0.35–4.50)

## 2016-11-17 NOTE — Progress Notes (Signed)
Subjective:   Joel [name] is a 81 y.o. male who presents for Medicare Annual/Subsequent preventive examination.  Review of Systems:  N/A Cardiac Risk Factors include: advanced age (>33men, >67 women);male gender;dyslipidemia;hypertension     Objective:    Vitals: BP (!) 140/100 (BP Location: Right Arm, Patient Position: Sitting, Cuff Size: Normal) Comment: BP medication not taken  Pulse (!) 58   Temp 98.1 F (36.7 C) (Oral)   Ht 5' 10.5" (1.791 m) Comment: no shoes  Wt 177 lb 4 oz (80.4 kg)   SpO2 97%   BMI 25.07 kg/m   Body mass index is 25.07 kg/m.  Tobacco History  Smoking Status  . Former Smoker  . Quit date: 12/05/1999  Smokeless Tobacco  . Never Used     Counseling given: No   Past Medical History:  Diagnosis Date  . Arthritis   . Bifascicular block   . Coronary artery disease    Post CABG in 2001 with patent grafts in 2010  (90% mid LAD stenosis with competing flow from LIMA distally, 90% OM stenosis with competing flow from SVG,  90% RCA stenosis with competing flow from SVG, occluded SVG to Posterolateral branch).  . Gastritis   . GERD (gastroesophageal reflux disease)   . History of nephrolithiasis   . Hyperlipidemia   . Hypertension   . Partial tear of rotator cuff(726.13)    Right  . Psoriasis   . Unstable angina [facility])    Past Surgical History:  Procedure Laterality Date  . BACK SURGERY    . CARDIAC CATHETERIZATION  01/2000   Multi vessel PTCA  . CARDIAC CATHETERIZATION  11/2008   multi vesstel dz, re occlusion small vessel  . CORONARY ARTERY BYPASS GRAFT  2001  . CT ABD W & PELVIS WO CM  09/2001   Kidney stones  . CT ABD WO/W & PELVIS WO CM  10/14/2007   Low grade obstruction on R by 79mm prox ereteral calculus, Sm bilat renal calculi sm HH  . Diffuision of unstable angina  03/2000  . ESOPHAGOGASTRODUODENOSCOPY  08/2008   Mild gastritis and GERD  . INGUINAL HERNIA REPAIR Right 09/10/2015   Procedure: RIGHT INGUINAL HERNIA REPAIR WITH MESH;   Surgeon: Coralie Keens, MD;  Location: [place];  Service: General;  Laterality: Right;  . INSERTION OF MESH Right 09/10/2015   Procedure: INSERTION OF MESH;  Surgeon: Coralie Keens, MD;  Location: [place];  Service: General;  Laterality: Right;  . KIDNEY STONE SURGERY  10/2007   Removal R ureteral kidney stone  . NASAL SINUS SURGERY  04/2002  . NECK SURGERY    . Pelvic CT  10/14/2007   nml  . PTCA  01/2000   Subendocardial MI  . Stress cardiolite  10/2003   No acute changes  . TONSILLECTOMY     Family History  Problem Relation Age of Onset  . Transient ischemic attack Mother   . Coronary artery disease Mother   . Heart disease Mother        CAD  . Alcohol abuse Father   . Diabetes type II Father   . Diabetes Father   . Heart disease Father        pacemaker   History  Sexual Activity  . Sexual activity: Not on file    Outpatient Encounter Prescriptions as of 11/17/2016  Medication Sig  . aspirin EC 81 MG tablet Take 1 tablet (81 mg total) by mouth daily.  . hydrochlorothiazide (HYDRODIURIL) 50 MG tablet  TAKE 1 TABLET (50 MG TOTAL) BY MOUTH DAILY.  Marland Kitchen latanoprost (XALATAN) 0.005 % ophthalmic solution Place 1 drop into both eyes at bedtime.  . Multiple Vitamin (MULTIVITAMIN) tablet Take 1 tablet by mouth daily.    Marland Kitchen omeprazole (PRILOSEC) 20 MG capsule Take 1 capsule (20 mg total) by mouth daily as needed. For acid reflux (Patient taking differently: Take 20 mg by mouth daily. For acid reflux)  . [DISCONTINUED] azithromycin (ZITHROMAX) 250 MG tablet Take 2 tabs today, then 1 tab daily x 4 days  . [DISCONTINUED] chlorpheniramine-HYDROcodone (TUSSIONEX PENNKINETIC ER) 10-8 MG/5ML SUER Take 5 mLs by mouth at bedtime as needed.  . [DISCONTINUED] diphenhydramine-acetaminophen (TYLENOL PM EXTRA STRENGTH) 25-500 MG TABS Take 1 tablet by mouth at bedtime as needed (AS NEEDED FOR SLEEP).    No facility-administered encounter medications on file as of 11/17/2016.     Activities of Daily  Living In your present state of health, do you have any difficulty performing the following activities: 11/17/2016  Hearing? Y  Vision? Y  Difficulty concentrating or making decisions? Y  Walking or climbing stairs? Y  Dressing or bathing? N  Doing errands, shopping? N  Preparing Food and eating ? N  Using the Toilet? N  In the past six months, have you accidently leaked urine? Y  Do you have problems with loss of bowel control? Y  Managing your Medications? N  Managing your Finances? N  Housekeeping or managing your Housekeeping? N  Some recent data might be hidden    Patient Care Team: Tower, Wynelle Fanny, MD as PCP - General   Assessment:    Hearing Screening Comments: Bilateral hearing aids Vision Screening Comments: Last vision exam on Nov 10, 2016 @ [facility]  Exercise Activities and Dietary recommendations Current Exercise Habits: Home exercise routine, Type of exercise: calisthenics, Time (Minutes): > 60 (45 min of back exercises twice daily), Frequency (Times/Week): 7, Weekly Exercise (Minutes/Week): 0, Intensity: Mild, Exercise limited by: None identified  Goals    . Increase physical activity          Starting 11/17/16, I will continue to do back exercises for at least 45 min twice daily.       Fall Risk Fall Risk  11/17/2016 11/12/2015 11/09/2014 11/06/2013  Falls in the past year? No Yes Yes Yes  Number falls in past yr: - 1 2 or more 2 or more  Injury with Fall? - Yes No -   Depression Screen PHQ 2/9 Scores 11/17/2016 11/12/2015 11/09/2014 11/06/2013  PHQ - 2 Score 1 1 0 0    Cognitive Function MMSE - Mini Mental State Exam 11/17/2016  Orientation to time 5  Orientation to Place 5  Registration 3  Attention/ Calculation 0  Recall 3  Language- name 2 objects 0  Language- repeat 1  Language- follow 3 step command 3  Language- read & follow direction 0  Write a sentence 0  Copy design 0  Total score 20     PLEASE NOTE: A Mini-Cog screen was completed. Maximum score is 20.  A value of 0 denotes this part of [place] MMSE was not completed or the patient failed this part of the Mini-Cog screening.   Mini-Cog Screening Orientation to Time - Max 5 pts Orientation to Place - Max 5 pts Registration - Max 3 pts Recall - Max 3 pts Language Repeat - Max 1 pts Language Follow 3 Step Command - Max 3 pts     Immunization History  Administered Date(s) Administered  .  Influenza Split 06/06/2011  . Influenza Whole 04/14/2007, 04/04/2008, 04/25/2009, 04/08/2010  . Influenza, High Dose Seasonal PF 04/27/2015, 04/29/2016  . Influenza-Unspecified 04/06/2014  . Pneumococcal Conjugate-13 11/06/2013  . Pneumococcal Polysaccharide-23 04/24/2002, 04/14/2007  . Td 07/06/2005, 11/11/2012   Screening Tests Health Maintenance  Topic Date Due  . INFLUENZA VACCINE  02/03/2017  . TETANUS/TDAP  11/12/2022  . PNA vac Low Risk Adult  Completed      Plan:     I have personally reviewed and addressed the Medicare Annual Wellness questionnaire and have noted the following in the patient's chart:  A. Medical and social history B. Use of alcohol, tobacco or illicit drugs  C. Current medications and supplements D. Functional ability and status E.  Nutritional status F.  Physical activity G. Advance directives H. List of other physicians I.  Hospitalizations, surgeries, and ER visits in previous 12 months J.  [place] to include hearing, vision, cognitive, depression L. Referrals and appointments - none  In addition, I have reviewed and discussed with patient certain preventive protocols, quality metrics, and best practice recommendations. A written personalized care plan for preventive services as well as general preventive health recommendations were provided to patient.  See attached scanned questionnaire for additional information.   Signed,   Lindell Noe, MHA, BS, LPN Health Coach'

## 2016-11-17 NOTE — Progress Notes (Signed)
Pre visit review using our clinic review tool, if applicable. No additional management support is needed unless otherwise documented below in the visit note. 

## 2016-11-17 NOTE — Patient Instructions (Signed)
Joel [name] , Thank you for taking time to come for your Medicare Wellness Visit. I appreciate your ongoing commitment to your health goals. Please review the following plan we discussed and let me know if I can assist you in the future.   These are the goals we discussed: Goals    . Increase physical activity          Starting 11/17/16, I will continue to do back exercises for at least 45 min twice daily.        This is a list of the screening recommended for you and due dates:  Health Maintenance  Topic Date Due  . Flu Shot  02/03/2017  . Tetanus Vaccine  11/12/2022  . Pneumonia vaccines  Completed   Preventive Care for Adults  A healthy lifestyle and preventive care can promote health and wellness. Preventive health guidelines for adults include the following key practices.  . A routine yearly physical is a good way to check with your health care provider about your health and preventive screening. It is a chance to share any concerns and updates on your health and to receive a thorough exam.  . Visit your dentist for a routine exam and preventive care every 6 months. Brush your teeth twice a day and floss once a day. Good oral hygiene prevents tooth decay and gum disease.  . The frequency of eye exams is based on your age, health, family medical history, use  of contact lenses, and other factors. Follow your health care provider's ecommendations for frequency of eye exams.  . Eat a healthy diet. Foods like vegetables, fruits, whole grains, low-fat dairy products, and lean protein foods contain the nutrients you need without too many calories. Decrease your intake of foods high in solid fats, added sugars, and salt. Eat the right amount of calories for you. Get information about a proper diet from your health care provider, if necessary.  . Regular physical exercise is one of the most important things you can do for your health. Most adults should get at least 150 minutes of  moderate-intensity exercise (any activity that increases your heart rate and causes you to sweat) each week. In addition, most adults need muscle-strengthening exercises on 2 or more days a week.  Silver Sneakers may be a benefit available to you. To determine eligibility, you may visit the website: www.silversneakers.com or contact program at 224-815-5471 Mon-Fri between 8AM-8PM.   . Maintain a healthy weight. The body mass index (BMI) is a screening tool to identify possible weight problems. It provides an estimate of body fat based on height and weight. Your health care provider can find your BMI and can help you achieve or maintain a healthy weight.   For adults 20 years and older: ? A BMI below 18.5 is considered underweight. ? A BMI of 18.5 to 24.9 is normal. ? A BMI of 25 to 29.9 is considered overweight. ? A BMI of 30 and above is considered obese.   . Maintain normal blood lipids and cholesterol levels by exercising and minimizing your intake of saturated fat. Eat a balanced diet with plenty of fruit and vegetables. Blood tests for lipids and cholesterol should begin at age 45 and be repeated every 5 years. If your lipid or cholesterol levels are high, you are over 50, or you are at high risk for heart disease, you may need your cholesterol levels checked more frequently. Ongoing high lipid and cholesterol levels should be treated with medicines if  diet and exercise are not working.  . If you smoke, find out from your health care provider how to quit. If you do not use tobacco, please do not start.  . If you choose to drink alcohol, please do not consume more than 2 drinks per day. One drink is considered to be 12 ounces (355 mL) of beer, 5 ounces (148 mL) of wine, or 1.5 ounces (44 mL) of liquor.  . If you are 15-60 years old, ask your health care provider if you should take aspirin to prevent strokes.  . Use sunscreen. Apply sunscreen liberally and repeatedly throughout the day. You  should seek shade when your shadow is shorter than you. Protect yourself by wearing long sleeves, pants, a wide-brimmed hat, and sunglasses year round, whenever you are outdoors.  . Once a month, do a whole body skin exam, using a mirror to look at the skin on your back. Tell your health care provider of new moles, moles that have irregular borders, moles that are larger than a pencil eraser, or moles that have changed in shape or color.

## 2016-11-17 NOTE — Progress Notes (Signed)
PCP notes:   Health maintenance:  No gaps identified.   Abnormal screenings:   Depression score: 1  Patient concerns:   Pt has ongoing concerns with lower pain. Seeing a chiropractor 3x weekly and is under care of [facility].  Nurse concerns:  None  Next PCP appt:   11/24/16 @ 0930

## 2016-11-18 NOTE — Progress Notes (Signed)
I reviewed health advisor's note, was available for consultation, and agree with documentation and plan.  

## 2016-11-24 ENCOUNTER — Ambulatory Visit (INDEPENDENT_AMBULATORY_CARE_PROVIDER_SITE_OTHER): Payer: Medicare Other | Admitting: Family Medicine

## 2016-11-24 ENCOUNTER — Encounter: Payer: Self-pay | Admitting: Family Medicine

## 2016-11-24 VITALS — BP 130/80 | HR 62 | Temp 98.3°F | Ht 70.5 in | Wt 182.2 lb

## 2016-11-24 DIAGNOSIS — R739 Hyperglycemia, unspecified: Secondary | ICD-10-CM

## 2016-11-24 DIAGNOSIS — I2510 Atherosclerotic heart disease of native coronary artery without angina pectoris: Secondary | ICD-10-CM | POA: Diagnosis not present

## 2016-11-24 DIAGNOSIS — E7800 Pure hypercholesterolemia, unspecified: Secondary | ICD-10-CM | POA: Diagnosis not present

## 2016-11-24 DIAGNOSIS — Z125 Encounter for screening for malignant neoplasm of prostate: Secondary | ICD-10-CM

## 2016-11-24 DIAGNOSIS — M545 Low back pain: Secondary | ICD-10-CM

## 2016-11-24 DIAGNOSIS — G8929 Other chronic pain: Secondary | ICD-10-CM

## 2016-11-24 DIAGNOSIS — I10 Essential (primary) hypertension: Secondary | ICD-10-CM

## 2016-11-24 DIAGNOSIS — Z0000 Encounter for general adult medical examination without abnormal findings: Secondary | ICD-10-CM | POA: Diagnosis not present

## 2016-11-24 DIAGNOSIS — K4090 Unilateral inguinal hernia, without obstruction or gangrene, not specified as recurrent: Secondary | ICD-10-CM

## 2016-11-24 MED ORDER — ASPIRIN EC 81 MG PO TBEC
81.0000 mg | DELAYED_RELEASE_TABLET | Freq: Every day | ORAL | 3 refills | Status: DC
Start: 2016-11-24 — End: 2020-07-11

## 2016-11-24 MED ORDER — OMEPRAZOLE 20 MG PO CPDR: 20.0000 mg | DELAYED_RELEASE_CAPSULE | Freq: Every day | ORAL | 3 refills | Status: DC | PRN

## 2016-11-24 MED ORDER — HYDROCHLOROTHIAZIDE 50 MG PO TABS: 50.0000 mg | ORAL_TABLET | Freq: Every day | ORAL | 3 refills | Status: DC

## 2016-11-24 MED ORDER — ASPIRIN EC 81 MG PO TBEC: 81.0000 mg | DELAYED_RELEASE_TABLET | Freq: Every day | ORAL | 3 refills | Status: DC

## 2016-11-24 NOTE — Assessment & Plan Note (Signed)
Ongoing  Pt continues to do heavy work  He is currently in PT and will f/u with the [facility] for this

## 2016-11-24 NOTE — Assessment & Plan Note (Signed)
Out aged screening  Some bph symptoms- no change  Declines further eval or any tx  Has seen urology in the past Also going to [place]

## 2016-11-24 NOTE — Progress Notes (Signed)
Subjective:    Patient ID: WINTER TREFZ, male    DOB: June 25, 1936, 81 y.o.   MRN: [id number]  HPI  Here for health maintenance exam and to review chronic medical problems   Has been dealing with "a bad back" and going to therapy 2-3 days per week  Does not think there is "real help for it"  He has gone to the [place]  He has not heard back from xrays yet   Still has discomfort in area of his prior hernia surgery R testicle continues to hurt at times  He has had to turn down several jobs   Sees dermatology for ongoing treatment    Had amw 5/15 Depression score 1   He still enjoys hunting/fishing/working - but not as happy since his wife died  He is very very active - hard outdoor work (example taking down trees/working on roof   Mod to severe hearing loss  He declines hearing aides   Wt Readings from Last 3 Encounters:  11/24/16 182 lb 4 oz (82.7 kg)  11/17/16 177 lb 4 oz (80.4 kg)  12/30/15 187 lb (84.8 kg)   bmi 25.7  utd imms   Colon cancer screening -not interested in any screening  He would not treat cancer if he had it anyway    bp is stable today  No cp or palpitations or headaches or edema  No side effects to medicines  BP Readings from Last 3 Encounters:  11/24/16 (!) 146/84  11/17/16 (!) 140/100  12/30/15 120/80    Better on  2nd check  BP: 130/80   He has not had cardiology f/u in over a year  He goes to the [place] instead and prefers not to see cardiology any further    Hx of hyperglycemia Lab Results  Component Value Date   HGBA1C 6.0 11/17/2016  does not "know" what a healthy diet would be - "I'm not going to change a whole lot"  This is down from 6.1 Does avoid fast food   Hx of hyperlipidemia in the setting of CAD No medicines Lab Results  Component Value Date   CHOL 228 (H) 11/17/2016   CHOL 194 11/07/2015   CHOL 132 11/02/2014   Lab Results  Component Value Date   HDL 53.90 11/17/2016   HDL 41.40 11/07/2015   HDL 39.20 11/02/2014    Lab Results  Component Value Date   LDLCALC 155 (H) 11/17/2016   LDLCALC 128 (H) 11/07/2015   LDLCALC 65 11/02/2014   Lab Results  Component Value Date   TRIG 98.0 11/17/2016   TRIG 123.0 11/07/2015   TRIG 138.0 11/02/2014   Lab Results  Component Value Date   CHOLHDL 4 11/17/2016   CHOLHDL 5 11/07/2015   CHOLHDL 3 11/02/2014   No results found for: LDLDIRECT Intolerant of statins He declines cardiology f/u -instead goes to the [place] Does the best he can with diet   Sees urology for prostate checks -in the past  Nocturia is once per night (he usually gets up due to back pain anyway) Occasional frequency - urinates every 4-6 hours  More discomfort from his hernia  Declines urol f/u or treatment   Patient Active Problem List   Diagnosis Date Noted  . Routine general medical examination at a health care facility 11/09/2016  . Hernia, inguinal 06/06/2015  . History of fall 11/09/2014  . Encounter for Medicare annual wellness exam 11/06/2013  . Prostate cancer screening 05/09/2013  . Hyperglycemia 07/31/2011  .  HTN (hypertension) 06/16/2011  . Night muscle spasms 01/21/2011  . Low back pain 01/16/2010  . CAD, AUTOLOGOUS BYPASS GRAFT 11/07/2008  . FLATULENCE-GAS-BLOATING 08/02/2008  . RUQ PAIN 07/10/2008  . URINARY CALCULUS 10/17/2007  . Coronary atherosclerosis 10/03/2007  . Hyperlipidemia 04/13/2007  . Hearing loss 04/13/2007  . RHINITIS, CHRONIC 04/13/2007  . GERD 04/13/2007  . Actinic keratosis 04/13/2007  . NEPHROLITHIASIS, HX OF 04/13/2007   Past Medical History:  Diagnosis Date  . Arthritis   . Bifascicular block   . Coronary artery disease    Post CABG in 2001 with patent grafts in 2010  (90% mid LAD stenosis with competing flow from LIMA distally, 90% OM stenosis with competing flow from SVG,  90% RCA stenosis with competing flow from SVG, occluded SVG to Posterolateral branch).  . Gastritis   . GERD (gastroesophageal reflux disease)   . History of  nephrolithiasis   . Hyperlipidemia   . Hypertension   . Partial tear of rotator cuff(726.13)    Right  . Psoriasis   . Unstable angina [facility])    Past Surgical History:  Procedure Laterality Date  . BACK SURGERY    . CARDIAC CATHETERIZATION  01/2000   Multi vessel PTCA  . CARDIAC CATHETERIZATION  11/2008   multi vesstel dz, re occlusion small vessel  . CORONARY ARTERY BYPASS GRAFT  2001  . CT ABD W & PELVIS WO CM  09/2001   Kidney stones  . CT ABD WO/W & PELVIS WO CM  10/14/2007   Low grade obstruction on R by 73mm prox ereteral calculus, Sm bilat renal calculi sm HH  . Diffuision of unstable angina  03/2000  . ESOPHAGOGASTRODUODENOSCOPY  08/2008   Mild gastritis and GERD  . INGUINAL HERNIA REPAIR Right 09/10/2015   Procedure: RIGHT INGUINAL HERNIA REPAIR WITH MESH;  Surgeon: Coralie Keens, MD;  Location: [facility];  Service: General;  Laterality: Right;  . INSERTION OF MESH Right 09/10/2015   Procedure: INSERTION OF MESH;  Surgeon: Coralie Keens, MD;  Location: [facility];  Service: General;  Laterality: Right;  . KIDNEY STONE SURGERY  10/2007   Removal R ureteral kidney stone  . NASAL SINUS SURGERY  04/2002  . NECK SURGERY    . Pelvic CT  10/14/2007   nml  . PTCA  01/2000   Subendocardial MI  . Stress cardiolite  10/2003   No acute changes  . TONSILLECTOMY     Social History  Substance Use Topics  . Smoking status: Former Smoker    Quit date: 12/05/1999  . Smokeless tobacco: Never Used  . Alcohol use No   Family History  Problem Relation Age of Onset  . Transient ischemic attack Mother   . Coronary artery disease Mother   . Heart disease Mother        CAD  . Alcohol abuse Father   . Diabetes type II Father   . Diabetes Father   . Heart disease Father        pacemaker   No Known Allergies Current Outpatient Prescriptions on File Prior to Visit  Medication Sig Dispense Refill  . latanoprost (XALATAN) 0.005 % ophthalmic solution Place 1 drop into both eyes at bedtime.     . Multiple Vitamin (MULTIVITAMIN) tablet Take 1 tablet by mouth daily.       No current facility-administered medications on file prior to visit.     Review of Systems    Review of Systems  Constitutional: Negative for fever, appetite change, fatigue  and unexpected weight change.  Eyes: Negative for pain and visual disturbance.  Respiratory: Negative for cough and shortness of breath.   Cardiovascular: Negative for cp or palpitations    Gastrointestinal: Negative for nausea, diarrhea and constipation. pos for ongoing pain in area of previous hernia surgery Genitourinary: Negative for urgency and frequency.  Skin: Negative for pallor or rash  pos for aks and frequent dermatology treatments  Neurological: Negative for weakness, light-headedness, numbness and headaches.  Hematological: Negative for adenopathy. Does not bruise/bleed easily.  Psychiatric/Behavioral: Negative for dysphoric mood. The patient is not nervous/anxious.      Objective:   Physical Exam  Constitutional: He appears well-developed and well-nourished. No distress.  Well appearing elderly male   HENT:  Head: Normocephalic and atraumatic.  Right Ear: External ear normal.  Left Ear: External ear normal.  Nose: Nose normal.  Mouth/Throat: Oropharynx is clear and moist.  Notable hearing loss  Eyes: Conjunctivae and EOM are normal. Pupils are equal, round, and reactive to light. Right eye exhibits no discharge. Left eye exhibits no discharge. No scleral icterus.  Neck: Normal range of motion. Neck supple. No JVD present. Carotid bruit is not present. No thyromegaly present.  Cardiovascular: Normal rate, regular rhythm, normal heart sounds and intact distal pulses.  Exam reveals no gallop.   Pulmonary/Chest: Effort normal and breath sounds normal. No respiratory distress. He has no wheezes. He exhibits no tenderness.  Abdominal: Soft. Bowel sounds are normal. He exhibits no distension, no abdominal bruit and no mass.  There is no tenderness.  No abdominal bulge or tenderness   Musculoskeletal: He exhibits no edema or tenderness.  Limited rom of LS  Lymphadenopathy:    He has no cervical adenopathy.  Neurological: He is alert. He has normal reflexes. No cranial nerve deficit. He exhibits normal muscle tone. Coordination normal.  Skin: Skin is warm and dry. No rash noted. No erythema. No pallor.  Solar lentigines diffusely and solar aging  Also AKs on sun exposed areas  Scars from prev mole removals   Psychiatric:  Somewhat irritable today Seems generally unhappy but not overtly depressed          Assessment & Plan:   Problem List Items Addressed This Visit      Cardiovascular and Mediastinum   Coronary atherosclerosis    Pt declines further cardiology f/u at this time No symptoms       Relevant Medications   hydrochlorothiazide (HYDRODIURIL) 50 MG tablet   aspirin EC 81 MG tablet   HTN (hypertension) - Primary    bp in fair control at this time  BP Readings from Last 1 Encounters:  11/24/16 130/80   No changes needed Disc lifstyle change with low sodium diet and exercise  Labs reviewed        Relevant Medications   hydrochlorothiazide (HYDRODIURIL) 50 MG tablet   aspirin EC 81 MG tablet     Other   Hernia, inguinal    S/p repair Still having discomfort  Will f/u with his surgeon       Hyperglycemia    Lab Results  Component Value Date   HGBA1C 6.0 11/17/2016   disc imp of low glycemic diet and wt loss to prevent DM2       Hyperlipidemia    Disc goals for lipids and reasons to control them Rev labs with pt Rev low sat fat diet in detail  Intolerant of statins LDL is high  Hx of CAD Declines further cardiology f/u or  medication       Relevant Medications   hydrochlorothiazide (HYDRODIURIL) 50 MG tablet   aspirin EC 81 MG tablet   Low back pain    Ongoing  Pt continues to do heavy work  He is currently in PT and will f/u with the [facility] for this        Relevant Medications   aspirin EC 81 MG tablet   Prostate cancer screening    Out aged screening  Some bph symptoms- no change  Declines further eval or any tx  Has seen urology in the past Also going to [place]      Routine general medical examination at a health care facility    Reviewed health habits including diet and exercise and skin cancer prevention Reviewed appropriate screening tests for age  Also reviewed health mt list, fam hx and immunization status , as well as social and family history   See HPI  Reviewed AMW Disc mood and motivation Severe hearing loss- declines hearing aides  Continues derm f/u  Declines cancer screening of any kind

## 2016-11-24 NOTE — Assessment & Plan Note (Signed)
S/p repair Still having discomfort  Will f/u with his surgeon

## 2016-11-24 NOTE — Patient Instructions (Addendum)
Make an appointment with your surgeon regarding the hernia   [facility]   For cholesterol  Avoid red meat/ fried foods/ egg yolks/ fatty breakfast meats/ butter, cheese and high fat dairy/ and shellfish    Keep protecting yourself from sunburn

## 2016-11-24 NOTE — Assessment & Plan Note (Signed)
Pt declines further cardiology f/u at this time No symptoms

## 2016-11-24 NOTE — Assessment & Plan Note (Signed)
Reviewed health habits including diet and exercise and skin cancer prevention Reviewed appropriate screening tests for age  Also reviewed health mt list, fam hx and immunization status , as well as social and family history   See HPI  Reviewed AMW Disc mood and motivation Severe hearing loss- declines hearing aides  Continues derm f/u  Declines cancer screening of any kind

## 2016-11-24 NOTE — Assessment & Plan Note (Signed)
Disc goals for lipids and reasons to control them Rev labs with pt Rev low sat fat diet in detail  Intolerant of statins LDL is high  Hx of CAD Declines further cardiology f/u or medication

## 2016-11-24 NOTE — Assessment & Plan Note (Signed)
Lab Results  Component Value Date   HGBA1C 6.0 11/17/2016   disc imp of low glycemic diet and wt loss to prevent DM2

## 2016-11-24 NOTE — Assessment & Plan Note (Signed)
bp in fair control at this time  BP Readings from Last 1 Encounters:  11/24/16 130/80   No changes needed Disc lifstyle change with low sodium diet and exercise  Labs reviewed

## 2017-10-28 ENCOUNTER — Ambulatory Visit (INDEPENDENT_AMBULATORY_CARE_PROVIDER_SITE_OTHER): Payer: Medicare Other | Admitting: Family Medicine

## 2017-10-28 ENCOUNTER — Encounter: Payer: Self-pay | Admitting: Family Medicine

## 2017-10-28 VITALS — BP 144/86 | HR 73 | Temp 98.5°F | Ht 70.5 in | Wt 186.8 lb

## 2017-10-28 DIAGNOSIS — S61239A Puncture wound without foreign body of unspecified finger without damage to nail, initial encounter: Secondary | ICD-10-CM

## 2017-10-28 MED ORDER — CEPHALEXIN 500 MG PO CAPS
500.0000 mg | ORAL_CAPSULE | Freq: Four times a day (QID) | ORAL | 0 refills | Status: DC
Start: 2017-10-28 — End: 2017-11-22

## 2017-10-28 NOTE — Patient Instructions (Signed)
Keep the finger very clean with soap and water at least twice daily  More often if it is draining  Use a warm compress and elevate you hand when you can   Take the keflex four times daily for 10 day   Call us in the morning and let us know how it looks  If worse or not improving we may need to get you to a hand specialist

## 2017-10-28 NOTE — Assessment & Plan Note (Signed)
R 3rd finger after a splinter injury with signs of infection  Unable to obt pus with lancing  Px keflex to take QID Warm compresses Soap and water cleanse Warm compress prn  Watch closely -disc danger of finger infections  If worse-call immediately (warm/red/swelling)  Update tomorrow- if not stable to improved will ref to hand surgeon for further eval

## 2017-10-28 NOTE — Progress Notes (Signed)
Subjective:    Patient ID: Joel [name], male    DOB: [birth year], 82 y.o.   MRN: [id number]  HPI Here for ? R finger infection (around the nail)   Right middle finger  Since last Thursday (got a wood splinter in it and pulled it out himself) He dug at it a lot  Drainage -some pus  Is sore  A little red   No fever  Feels fine   Patient Active Problem List   Diagnosis Date Noted  . Puncture wound of finger 10/28/2017  . Routine general medical examination at a health care facility 11/09/2016  . Hernia, inguinal 06/06/2015  . History of fall 11/09/2014  . Encounter for Medicare annual wellness exam 11/06/2013  . Prostate cancer screening 05/09/2013  . Hyperglycemia 07/31/2011  . HTN (hypertension) 06/16/2011  . Night muscle spasms 01/21/2011  . Low back pain 01/16/2010  . CAD, AUTOLOGOUS BYPASS GRAFT 11/07/2008  . FLATULENCE-GAS-BLOATING 08/02/2008  . RUQ PAIN 07/10/2008  . URINARY CALCULUS 10/17/2007  . Coronary atherosclerosis 10/03/2007  . Hyperlipidemia 04/13/2007  . Hearing loss 04/13/2007  . RHINITIS, CHRONIC 04/13/2007  . GERD 04/13/2007  . Actinic keratosis 04/13/2007  . NEPHROLITHIASIS, HX OF 04/13/2007   Past Medical History:  Diagnosis Date  . Arthritis   . Bifascicular block   . Coronary artery disease    Post CABG in 2001 with patent grafts in 2010  (90% mid LAD stenosis with competing flow from LIMA distally, 90% OM stenosis with competing flow from SVG,  90% RCA stenosis with competing flow from SVG, occluded SVG to Posterolateral branch).  . Gastritis   . GERD (gastroesophageal reflux disease)   . History of nephrolithiasis   . Hyperlipidemia   . Hypertension   . Partial tear of rotator cuff(726.13)    Right  . Psoriasis   . Unstable angina [facility])    Past Surgical History:  Procedure Laterality Date  . BACK SURGERY    . CARDIAC CATHETERIZATION  01/2000   Multi vessel PTCA  . CARDIAC CATHETERIZATION  11/2008   multi vesstel dz, re occlusion  small vessel  . CORONARY ARTERY BYPASS GRAFT  2001  . CT ABD W & PELVIS WO CM  09/2001   Kidney stones  . CT ABD WO/W & PELVIS WO CM  10/14/2007   Low grade obstruction on R by 56mm prox ereteral calculus, Sm bilat renal calculi sm HH  . Diffuision of unstable angina  03/2000  . ESOPHAGOGASTRODUODENOSCOPY  08/2008   Mild gastritis and GERD  . INGUINAL HERNIA REPAIR Right 09/10/2015   Procedure: RIGHT INGUINAL HERNIA REPAIR WITH MESH;  Surgeon: Coralie Keens, MD;  Location: [place];  Service: General;  Laterality: Right;  . INSERTION OF MESH Right 09/10/2015   Procedure: INSERTION OF MESH;  Surgeon: Coralie Keens, MD;  Location: [place];  Service: General;  Laterality: Right;  . KIDNEY STONE SURGERY  10/2007   Removal R ureteral kidney stone  . NASAL SINUS SURGERY  04/2002  . NECK SURGERY    . Pelvic CT  10/14/2007   nml  . PTCA  01/2000   Subendocardial MI  . Stress cardiolite  10/2003   No acute changes  . TONSILLECTOMY     Social History   Tobacco Use  . Smoking status: Former Smoker    Last attempt to quit: 12/05/1999    Years since quitting: 17.9  . Smokeless tobacco: Never Used  Substance Use Topics  . Alcohol use: No  Alcohol/week: 0.0 oz  . Drug use: No   Family History  Problem Relation Age of Onset  . Transient ischemic attack Mother   . Coronary artery disease Mother   . Heart disease Mother        CAD  . Alcohol abuse Father   . Diabetes type II Father   . Diabetes Father   . Heart disease Father        pacemaker   No Known Allergies Current Outpatient Medications on File Prior to Visit  Medication Sig Dispense Refill  . aspirin EC 81 MG tablet Take 1 tablet (81 mg total) by mouth daily. 90 tablet 3  . hydrochlorothiazide (HYDRODIURIL) 50 MG tablet Take 1 tablet (50 mg total) by mouth daily. 90 tablet 3  . latanoprost (XALATAN) 0.005 % ophthalmic solution Place 1 drop into both eyes at bedtime.    . Multiple Vitamin (MULTIVITAMIN) tablet Take 1 tablet by  mouth daily.      Marland Kitchen omeprazole (PRILOSEC) 20 MG capsule Take 1 capsule (20 mg total) by mouth daily as needed. For acid reflux 90 capsule 3  . ZINC OXIDE EX Apply topically.     No current facility-administered medications on file prior to visit.     Review of Systems  Constitutional: Negative for activity change, appetite change, fatigue, fever and unexpected weight change.  HENT: Negative for congestion, rhinorrhea, sore throat and trouble swallowing.   Eyes: Negative for pain, redness, itching and visual disturbance.  Respiratory: Negative for cough, chest tightness, shortness of breath and wheezing.   Cardiovascular: Negative for chest pain and palpitations.  Gastrointestinal: Negative for abdominal pain, blood in stool, constipation, diarrhea and nausea.  Endocrine: Negative for cold intolerance, heat intolerance, polydipsia and polyuria.  Genitourinary: Negative for difficulty urinating, dysuria, frequency and urgency.  Musculoskeletal: Negative for arthralgias, joint swelling and myalgias.       Pain of R middle finger  Skin: Positive for wound. Negative for pallor and rash.  Neurological: Negative for dizziness, tremors, weakness, numbness and headaches.  Hematological: Negative for adenopathy. Does not bruise/bleed easily.  Psychiatric/Behavioral: Negative for decreased concentration and dysphoric mood. The patient is not nervous/anxious.        Objective:   Physical Exam  Constitutional: He appears well-developed and well-nourished. No distress.  Well appearing    HENT:  Head: Atraumatic. Macrocephalic.  Very hard of hearing   Eyes: Pupils are equal, round, and reactive to light. Conjunctivae and EOM are normal. Right eye exhibits no discharge. No scleral icterus.  Neck: Normal range of motion. Neck supple.  Cardiovascular: Normal rate and regular rhythm.  Lymphadenopathy:    He has no cervical adenopathy.  Neurological: He is alert. No cranial nerve deficit. He  exhibits normal muscle tone. Coordination normal.  Skin: Skin is warm and dry. No rash noted. There is erythema. No pallor.  R middle finger Mild erythema and swelling with some fluctuance just lateral of the nail Area cleaned and lanced with needle-no pus drainage obtained  No fb noted  Moderately tender     Psychiatric: He has a normal mood and affect.          Assessment & Plan:   Problem List Items Addressed This Visit      Other   Puncture wound of finger - Primary    R 3rd finger after a splinter injury with signs of infection  Unable to obt pus with lancing  Px keflex to take QID Warm compresses Soap and water  cleanse Warm compress prn  Watch closely -disc danger of finger infections  If worse-call immediately (warm/red/swelling)  Update tomorrow- if not stable to improved will ref to hand surgeon for further eval

## 2017-11-21 ENCOUNTER — Telehealth: Payer: Self-pay | Admitting: Family Medicine

## 2017-11-21 DIAGNOSIS — E7800 Pure hypercholesterolemia, unspecified: Secondary | ICD-10-CM

## 2017-11-21 DIAGNOSIS — I10 Essential (primary) hypertension: Secondary | ICD-10-CM

## 2017-11-21 DIAGNOSIS — R739 Hyperglycemia, unspecified: Secondary | ICD-10-CM

## 2017-11-21 NOTE — Telephone Encounter (Signed)
-----   Message from Eustace Pen, LPN sent at 5/73/2202  2:30 PM EDT ----- Regarding: Labs 5/20 Lab orders needed. Thank you.  Insurance:  [facility] Medicare

## 2017-11-22 ENCOUNTER — Ambulatory Visit (INDEPENDENT_AMBULATORY_CARE_PROVIDER_SITE_OTHER): Payer: Medicare Other

## 2017-11-22 VITALS — BP 132/90 | HR 62 | Temp 98.0°F | Ht 70.0 in | Wt 182.2 lb

## 2017-11-22 DIAGNOSIS — R739 Hyperglycemia, unspecified: Secondary | ICD-10-CM

## 2017-11-22 DIAGNOSIS — E7800 Pure hypercholesterolemia, unspecified: Secondary | ICD-10-CM | POA: Diagnosis not present

## 2017-11-22 DIAGNOSIS — Z0000 Encounter for general adult medical examination without abnormal findings: Secondary | ICD-10-CM | POA: Diagnosis not present

## 2017-11-22 DIAGNOSIS — I10 Essential (primary) hypertension: Secondary | ICD-10-CM

## 2017-11-22 LAB — LIPID PANEL
CHOLESTEROL: 211 mg/dL — AB (ref 0–200)
HDL: 41.2 mg/dL (ref >39.00)
LDL Cholesterol: 138 mg/dL — ABNORMAL HIGH (ref 0–99)
NonHDL: 169.46
Total CHOL/HDL Ratio: 5
Triglycerides: 159 mg/dL — ABNORMAL HIGH (ref 0.0–149.0)
VLDL: 31.8 mg/dL (ref 0.0–40.0)

## 2017-11-22 LAB — CBC WITH DIFFERENTIAL/PLATELET
BASOS ABS: 0.1 10*3/uL (ref 0.0–0.1)
Basophils Relative: 0.7 % (ref 0.0–3.0)
EOS ABS: 0.4 10*3/uL (ref 0.0–0.7)
EOS PCT: 5.7 % — AB (ref 0.0–5.0)
HCT: 47.2 % (ref 39.0–52.0)
Hemoglobin: 16.6 g/dL (ref 13.0–17.0)
LYMPHS ABS: 2.3 10*3/uL (ref 0.7–4.0)
LYMPHS PCT: 30.6 % (ref 12.0–46.0)
MCHC: 35.2 g/dL (ref 30.0–36.0)
MCV: 91.7 fl (ref 78.0–100.0)
MONO ABS: 0.7 10*3/uL (ref 0.1–1.0)
MONOS PCT: 9.9 % (ref 3.0–12.0)
NEUTROS PCT: 53.1 % (ref 43.0–77.0)
Neutro Abs: 3.9 10*3/uL (ref 1.4–7.7)
Platelets: 217 10*3/uL (ref 150.0–400.0)
RBC: 5.15 Mil/uL (ref 4.22–5.81)
RDW: 12.9 % (ref 11.5–15.5)
WBC: 7.4 10*3/uL (ref 4.0–10.5)

## 2017-11-22 LAB — HEMOGLOBIN A1C: HEMOGLOBIN A1C: 6.1 % (ref 4.6–6.5)

## 2017-11-22 LAB — COMPREHENSIVE METABOLIC PANEL
ALK PHOS: 67 U/L (ref 39–117)
ALT: 21 U/L (ref 0–53)
AST: 17 U/L (ref 0–37)
Albumin: 4.2 g/dL (ref 3.5–5.2)
BUN: 16 mg/dL (ref 6–23)
CALCIUM: 9.3 mg/dL (ref 8.4–10.5)
CO2: 30 mEq/L (ref 19–32)
CREATININE: 0.99 mg/dL (ref 0.40–1.50)
Chloride: 101 mEq/L (ref 96–112)
GFR: 77.37 mL/min (ref >60.00)
Glucose, Bld: 113 mg/dL — ABNORMAL HIGH (ref 70–99)
Potassium: 4 mEq/L (ref 3.5–5.1)
SODIUM: 139 meq/L (ref 135–145)
TOTAL PROTEIN: 6.8 g/dL (ref 6.0–8.3)
Total Bilirubin: 1.2 mg/dL (ref 0.2–1.2)

## 2017-11-22 LAB — TSH: TSH: 1.66 u[IU]/mL (ref 0.35–4.50)

## 2017-11-22 NOTE — Progress Notes (Signed)
Subjective:   Joel [name] is a 82 y.o. male who presents for Medicare Annual/Subsequent preventive examination.  Review of Systems:  N/A Cardiac Risk Factors include: advanced age (>72men, >53 women);dyslipidemia;male gender;hypertension     Objective:    Vitals: BP 132/90 (BP Location: Right Arm, Patient Position: Sitting, Cuff Size: Normal)   Pulse 62   Temp 98 F (36.7 C) (Oral)   Ht 5\' 10"  (1.778 m) Comment: no shoes  Wt 182 lb 4 oz (82.7 kg)   SpO2 97%   BMI 26.15 kg/m   Body mass index is 26.15 kg/m.  Advanced Directives 11/22/2017 11/17/2016  Does Patient Have a Medical Advance Directive? Yes Yes  Type of Paramedic of [place];Living will [place];Living will  Does patient want to make changes to medical advance directive? No - Patient declined -  Copy of [place] in Chart? No - copy requested No - copy requested    Tobacco Social History   Tobacco Use  Smoking Status Former Smoker  . Last attempt to quit: 12/05/1999  . Years since quitting: 17.9  Smokeless Tobacco Never Used     Counseling given: No   Clinical Intake:  Pre-visit preparation completed: Yes  Pain Score: 3      Nutritional Status: BMI 25 -29 Overweight Nutritional Risks: None Diabetes: No  How often do you need to have someone help you when you read instructions, pamphlets, or other written materials from your doctor or pharmacy?: 1 - Never What is the last grade level you completed in school?: 10th grade  Interpreter Needed?: No  Comments: pt lives alone Information entered by :: LPinson, LPN  Past Medical History:  Diagnosis Date  . Arthritis   . Bifascicular block   . Coronary artery disease    Post CABG in 2001 with patent grafts in 2010  (90% mid LAD stenosis with competing flow from LIMA distally, 90% OM stenosis with competing flow from SVG,  90% RCA stenosis with competing flow from SVG, occluded SVG to  Posterolateral branch).  . Gastritis   . GERD (gastroesophageal reflux disease)   . History of nephrolithiasis   . Hyperlipidemia   . Hypertension   . Partial tear of rotator cuff(726.13)    Right  . Psoriasis   . Unstable angina [facility])    Past Surgical History:  Procedure Laterality Date  . BACK SURGERY    . CARDIAC CATHETERIZATION  01/2000   Multi vessel PTCA  . CARDIAC CATHETERIZATION  11/2008   multi vesstel dz, re occlusion small vessel  . CORONARY ARTERY BYPASS GRAFT  2001  . CT ABD W & PELVIS WO CM  09/2001   Kidney stones  . CT ABD WO/W & PELVIS WO CM  10/14/2007   Low grade obstruction on R by 55mm prox ereteral calculus, Sm bilat renal calculi sm HH  . Diffuision of unstable angina  03/2000  . ESOPHAGOGASTRODUODENOSCOPY  08/2008   Mild gastritis and GERD  . INGUINAL HERNIA REPAIR Right 09/10/2015   Procedure: RIGHT INGUINAL HERNIA REPAIR WITH MESH;  Surgeon: Coralie Keens, MD;  Location: [place];  Service: General;  Laterality: Right;  . INSERTION OF MESH Right 09/10/2015   Procedure: INSERTION OF MESH;  Surgeon: Coralie Keens, MD;  Location: [place];  Service: General;  Laterality: Right;  . KIDNEY STONE SURGERY  10/2007   Removal R ureteral kidney stone  . NASAL SINUS SURGERY  04/2002  . NECK SURGERY    .  Pelvic CT  10/14/2007   nml  . PTCA  01/2000   Subendocardial MI  . Stress cardiolite  10/2003   No acute changes  . TONSILLECTOMY     Family History  Problem Relation Age of Onset  . Transient ischemic attack Mother   . Coronary artery disease Mother   . Heart disease Mother        CAD  . Alcohol abuse Father   . Diabetes type II Father   . Diabetes Father   . Heart disease Father        pacemaker   Social History   Socioeconomic History  . Marital status: Widowed    Spouse name: Not on file  . Number of children: Not on file  . Years of education: Not on file  . Highest education level: Not on file  Occupational History  . Occupation: Retired  Dispensing optician work    Fish farm manager: RETIRED  Social Needs  . Financial resource strain: Not on file  . Food insecurity:    Worry: Not on file    Inability: Not on file  . Transportation needs:    Medical: Not on file    Non-medical: Not on file  Tobacco Use  . Smoking status: Former Smoker    Last attempt to quit: 12/05/1999    Years since quitting: 17.9  . Smokeless tobacco: Never Used  Substance and Sexual Activity  . Alcohol use: No    Alcohol/week: 0.0 oz  . Drug use: No  . Sexual activity: Not Currently  Lifestyle  . Physical activity:    Days per week: Not on file    Minutes per session: Not on file  . Stress: Not on file  Relationships  . Social connections:    Talks on phone: Not on file    Gets together: Not on file    Attends religious service: Not on file    Active member of club or organization: Not on file    Attends meetings of clubs or organizations: Not on file    Relationship status: Not on file  Other Topics Concern  . Not on file  Social History Narrative   1 half brother, 3 half sisters   Wife died last year of lung cancer   2 children   Fishes and walks dog for exercise      Field seismologist Release form and gives Kathe Mariner, daughter [phone]. Can leave msg on ans machine    Outpatient Encounter Medications as of 11/22/2017  Medication Sig  . aspirin EC 81 MG tablet Take 1 tablet (81 mg total) by mouth daily.  . hydrochlorothiazide (HYDRODIURIL) 50 MG tablet Take 1 tablet (50 mg total) by mouth daily.  Marland Kitchen latanoprost (XALATAN) 0.005 % ophthalmic solution Place 1 drop into both eyes at bedtime.  Benita Stabile HCL PO Take 25 mg by mouth 2 (two) times daily.  . Multiple Vitamin (MULTIVITAMIN) tablet Take 1 tablet by mouth daily.    Marland Kitchen omeprazole (PRILOSEC) 20 MG capsule Take 1 capsule (20 mg total) by mouth daily as needed. For acid reflux  . ZINC OXIDE EX Apply topically.  . [DISCONTINUED] cephALEXin (KEFLEX) 500 MG capsule Take 1 capsule (500 mg  total) by mouth 4 (four) times daily.   No facility-administered encounter medications on file as of 11/22/2017.     Activities of Daily Living In your present state of health, do you have any difficulty performing the following activities: 11/22/2017  Hearing? Y  Vision? N  Difficulty concentrating or making decisions? Y  Walking or climbing stairs? N  Dressing or bathing? N  Doing errands, shopping? N  Preparing Food and eating ? N  Using the Toilet? N  In the past six months, have you accidently leaked urine? N  Do you have problems with loss of bowel control? N  Managing your Medications? N  Managing your Finances? N  Housekeeping or managing your Housekeeping? N  Some recent data might be hidden    Patient Care Team: Tower, Wynelle Fanny, MD as PCP - General   Assessment:   This is a routine wellness examination for [place].  Hearing Screening Comments: Bilateral hearing aids (does not wear them) Vision Screening Comments: Vision exam every 6 mths @ [place]  . Exercise Activities and Dietary recommendations Current Exercise Habits: Home exercise routine, Type of exercise: walking, Time (Minutes): 30, Frequency (Times/Week): 7, Weekly Exercise (Minutes/Week): 210, Intensity: Mild, Exercise limited by: None identified  Goals    . Increase physical activity     Starting 11/22/2017, I will continue to walk at least 4000 steps daily.        Fall Risk Fall Risk  11/22/2017 11/17/2016 11/12/2015 11/09/2014 11/06/2013  Falls in the past year? Yes No Yes Yes Yes  Comment multiple falls due to tripping and/or balance concerns - - - -  Number falls in past yr: 2 or more - 1 2 or more 2 or more  Injury with Fall? No - Yes No -  Risk Factor Category  High Fall Risk - - - -  Risk for fall due to : Impaired balance/gait - - - -   Depression Screen PHQ 2/9 Scores 11/22/2017 11/17/2016 11/12/2015 11/09/2014  PHQ - 2 Score 2 1 1  0  PHQ- 9 Score 7 - - -    Cognitive Function MMSE - Mini Mental State  Exam 11/22/2017 11/17/2016  Orientation to time 5 5  Orientation to Place 5 5  Registration 3 3  Attention/ Calculation 0 0  Recall 3 3  Language- name 2 objects 0 0  Language- repeat 1 1  Language- follow 3 step command 3 3  Language- read & follow direction 0 0  Write a sentence 0 0  Copy design 0 0  Total score 20 20        Immunization History  Administered Date(s) Administered  . Influenza Split 06/06/2011  . Influenza Whole 04/14/2007, 04/04/2008, 04/25/2009, 04/08/2010  . Influenza, High Dose Seasonal PF 04/27/2015, 04/29/2016, 04/10/2017  . Influenza-Unspecified 04/06/2014  . Pneumococcal Conjugate-13 11/06/2013  . Pneumococcal Polysaccharide-23 04/24/2002, 04/14/2007  . Td 07/06/2005, 11/11/2012   Screening Tests Health Maintenance  Topic Date Due  . INFLUENZA VACCINE  02/03/2018  . TETANUS/TDAP  11/12/2022  . PNA vac Low Risk Adult  Completed        Plan:     I have personally reviewed, addressed, and noted the following in the patient's chart:  A. Medical and social history B. Use of alcohol, tobacco or illicit drugs  C. Current medications and supplements D. Functional ability and status E.  Nutritional status F.  Physical activity G. Advance directives H. List of other physicians I.  Hospitalizations, surgeries, and ER visits in previous 12 months J.  [place] to include hearing, vision, cognitive, depression L. Referrals and appointments - none  In addition, I have reviewed and discussed with patient certain preventive protocols, quality metrics, and best practice recommendations. A written personalized care plan for preventive services as  well as general preventive health recommendations were provided to patient.  See attached scanned questionnaire for additional information.   Signed,   Lindell Noe, MHA, BS, LPN Health Coach

## 2017-11-22 NOTE — Progress Notes (Signed)
PCP notes:   Health maintenance:  No gaps identified.   Abnormal screenings:   Fall risk - hx of multiple falls Fall Risk  11/22/2017 11/17/2016 11/12/2015 11/09/2014 11/06/2013  Falls in the past year? Yes No Yes Yes Yes  Comment multiple falls due to tripping and/or balance concerns - - - -  Number falls in past yr: 2 or more - 1 2 or more 2 or more  Injury with Fall? No - Yes No -  Risk Factor Category  High Fall Risk - - - -  Risk for fall due to : Impaired balance/gait - - - -   Depression score:  Depression screen [facility] 2/9 11/22/2017 11/17/2016 11/12/2015 11/09/2014 11/06/2013  Decreased Interest 1 0 1 0 0  Down, Depressed, Hopeless 1 1 0 0 0  PHQ - 2 Score 2 1 1  0 0  Altered sleeping 1 - - - -  Tired, decreased energy 1 - - - -  Change in appetite 0 - - - -  Feeling bad or failure about yourself  1 - - - -  Trouble concentrating 1 - - - -  Moving slowly or fidgety/restless 1 - - - -  Suicidal thoughts 0 - - - -  PHQ-9 Score 7 - - - -  Difficult doing work/chores Somewhat difficult - - - -   Patient concerns:   Dizziness - treated by [facility] with Meclizine. States medication is not working.  Chronic sinus drainage - states OTC medications have been minimally effective  Nurse concerns:  None  Next PCP appt:   12/06/17 @ 0830  I reviewed health advisor's note, was available for consultation, and agree with documentation and plan. Loura Pardon MD

## 2017-11-22 NOTE — Patient Instructions (Addendum)
Joel [name] , Thank you for taking time to come for your Medicare Wellness Visit. I appreciate your ongoing commitment to your health goals. Please review the following plan we discussed and let me know if I can assist you in the future.   These are the goals we discussed: Goals    . Increase physical activity     Starting 11/22/2017, I will continue to walk at least 4000 steps daily.        This is a list of the screening recommended for you and due dates:  Health Maintenance  Topic Date Due  . Flu Shot  02/03/2018  . Tetanus Vaccine  11/12/2022  . Pneumonia vaccines  Completed   Preventive Care for Adults  A healthy lifestyle and preventive care can promote health and wellness. Preventive health guidelines for adults include the following key practices.  . A routine yearly physical is a good way to check with your health care provider about your health and preventive screening. It is a chance to share any concerns and updates on your health and to receive a thorough exam.  . Visit your dentist for a routine exam and preventive care every 6 months. Brush your teeth twice a day and floss once a day. Good oral hygiene prevents tooth decay and gum disease.  . The frequency of eye exams is based on your age, health, family medical history, use  of contact lenses, and other factors. Follow your health care provider's recommendations for frequency of eye exams.  . Eat a healthy diet. Foods like vegetables, fruits, whole grains, low-fat dairy products, and lean protein foods contain the nutrients you need without too many calories. Decrease your intake of foods high in solid fats, added sugars, and salt. Eat the right amount of calories for you. Get information about a proper diet from your health care provider, if necessary.  . Regular physical exercise is one of the most important things you can do for your health. Most adults should get at least 150 minutes of moderate-intensity exercise  (any activity that increases your heart rate and causes you to sweat) each week. In addition, most adults need muscle-strengthening exercises on 2 or more days a week.  Silver Sneakers may be a benefit available to you. To determine eligibility, you may visit the website: www.silversneakers.com or contact program at (954) 551-1645 Mon-Fri between 8AM-8PM.   . Maintain a healthy weight. The body mass index (BMI) is a screening tool to identify possible weight problems. It provides an estimate of body fat based on height and weight. Your health care provider can find your BMI and can help you achieve or maintain a healthy weight.   For adults 20 years and older: ? A BMI below 18.5 is considered underweight. ? A BMI of 18.5 to 24.9 is normal. ? A BMI of 25 to 29.9 is considered overweight. ? A BMI of 30 and above is considered obese.   . Maintain normal blood lipids and cholesterol levels by exercising and minimizing your intake of saturated fat. Eat a balanced diet with plenty of fruit and vegetables. Blood tests for lipids and cholesterol should begin at age 1 and be repeated every 5 years. If your lipid or cholesterol levels are high, you are over 50, or you are at high risk for heart disease, you may need your cholesterol levels checked more frequently. Ongoing high lipid and cholesterol levels should be treated with medicines if diet and exercise are not working.  . If  you smoke, find out from your health care provider how to quit. If you do not use tobacco, please do not start.  . If you choose to drink alcohol, please do not consume more than 2 drinks per day. One drink is considered to be 12 ounces (355 mL) of beer, 5 ounces (148 mL) of wine, or 1.5 ounces (44 mL) of liquor.  . If you are 30-32 years old, ask your health care provider if you should take aspirin to prevent strokes.  . Use sunscreen. Apply sunscreen liberally and repeatedly throughout the day. You should seek shade when your  shadow is shorter than you. Protect yourself by wearing long sleeves, pants, a wide-brimmed hat, and sunglasses year round, whenever you are outdoors.  . Once a month, do a whole body skin exam, using a mirror to look at the skin on your back. Tell your health care provider of new moles, moles that have irregular borders, moles that are larger than a pencil eraser, or moles that have changed in shape or color.

## 2017-12-06 ENCOUNTER — Encounter: Payer: Self-pay | Admitting: Family Medicine

## 2017-12-06 ENCOUNTER — Ambulatory Visit (INDEPENDENT_AMBULATORY_CARE_PROVIDER_SITE_OTHER): Payer: Medicare Other | Admitting: Family Medicine

## 2017-12-06 VITALS — BP 122/74 | HR 51 | Temp 98.1°F | Ht 70.0 in | Wt 185.2 lb

## 2017-12-06 DIAGNOSIS — Z125 Encounter for screening for malignant neoplasm of prostate: Secondary | ICD-10-CM

## 2017-12-06 DIAGNOSIS — H9193 Unspecified hearing loss, bilateral: Secondary | ICD-10-CM

## 2017-12-06 DIAGNOSIS — I10 Essential (primary) hypertension: Secondary | ICD-10-CM | POA: Diagnosis not present

## 2017-12-06 DIAGNOSIS — Z0000 Encounter for general adult medical examination without abnormal findings: Secondary | ICD-10-CM

## 2017-12-06 DIAGNOSIS — E7800 Pure hypercholesterolemia, unspecified: Secondary | ICD-10-CM | POA: Diagnosis not present

## 2017-12-06 DIAGNOSIS — L570 Actinic keratosis: Secondary | ICD-10-CM

## 2017-12-06 DIAGNOSIS — R739 Hyperglycemia, unspecified: Secondary | ICD-10-CM | POA: Diagnosis not present

## 2017-12-06 MED ORDER — HYDROCHLOROTHIAZIDE 50 MG PO TABS: 50.0000 mg | ORAL_TABLET | Freq: Every day | ORAL | 3 refills | Status: DC

## 2017-12-06 MED ORDER — OMEPRAZOLE 20 MG PO CPDR: 20.0000 mg | DELAYED_RELEASE_CAPSULE | Freq: Every day | ORAL | 3 refills | Status: DC | PRN

## 2017-12-06 NOTE — Assessment & Plan Note (Signed)
Continue f/u with derm Area on L lower lip is abraded - will f/u with them  Wears more protective clothing Also lip balm with sunscreen

## 2017-12-06 NOTE — Patient Instructions (Addendum)
Take care of yourself   To prevent diabetes Try to get most of your carbohydrates from produce (with the exception of white potatoes)  Eat less bread/pasta/rice/snack foods/cereals/sweets and other items from the middle of the grocery store (processed carbs) For cholesterol   Avoid red meat/ fried foods/ egg yolks/ fatty breakfast meats/ butter, cheese and high fat dairy/ and shellfish    Dehydration can make you dizzy- keep up with a good fluid intake (mostly water)   Wear your sunscreen  See the dermatologist as planned and use sun protection  Have them evaluate your lips

## 2017-12-06 NOTE — Assessment & Plan Note (Signed)
Had hearing aides-did not like them

## 2017-12-06 NOTE — Assessment & Plan Note (Signed)
Lab Results  Component Value Date   HGBA1C 6.1 11/22/2017   disc imp of low glycemic diet and wt loss to prevent DM2

## 2017-12-06 NOTE — Assessment & Plan Note (Signed)
Reviewed health habits including diet and exercise and skin cancer prevention Reviewed appropriate screening tests for age  Also reviewed health mt list, fam hx and immunization status , as well as social and family history   See HPI amw rev Labs rev  Declines colon screen and declines zoster vaccine  [facility] f[facility] and [facility]  Disc mood- still grief for loss of wife but doing ok  Suggested more socialization

## 2017-12-06 NOTE — Assessment & Plan Note (Signed)
Disc goals for lipids and reasons to control them Rev last labs with pt Rev low sat fat diet in detail LDL is down a bit  Intol to statins

## 2017-12-06 NOTE — Progress Notes (Signed)
Subjective:    Patient ID: Joel [name], male    DOB: 08-Nov-1935, 82 y.o.   MRN: [id number]  HPI  Here for health maintenance exam and to review chronic medical problems    Doing ok overall  Doing a bit of fishing and a lot of gardening   Wt Readings from Last 3 Encounters:  12/06/17 185 lb 4 oz (84 kg)  11/22/17 182 lb 4 oz (82.7 kg)  10/28/17 186 lb 12 oz (84.7 kg)  fairly stable weight  26.58 kg/m   Had amw 5/20  Depression score 7  (lost wife- he keeps busy)  Noted he had chronic sinus drainage/years  Also tx recently at the [place] for dizziness with meclizine   utd imms  Zoster status -not interested in a shingles vaccine   Not interested in colon cancer screening   Prostate health Has seen urology in the past (not recent)  Nocturia 0-3 times per night (depends on what he eats)  Minor trouble emptying bladder since his hernia surgery- but it is not a problem    Goes to the [place] as needed They do labs on him also  Will be having MRI on R knee upcoming (had a shot in it months ago)   He continues to see dermatology- next appt in oct  They stay on top of cancers and pre cancers  Has removed mole from scalp -watching    bp is stable today  No cp or palpitations or headaches or edema  No side effects to medicines  BP Readings from Last 3 Encounters:  12/06/17 122/74  11/22/17 132/90  10/28/17 (!) 144/86     Lab Results  Component Value Date   CREATININE 0.99 11/22/2017   BUN 16 11/22/2017   NA 139 11/22/2017   K 4.0 11/22/2017   CL 101 11/22/2017   CO2 30 11/22/2017   Lab Results  Component Value Date   ALT 21 11/22/2017   AST 17 11/22/2017   ALKPHOS 67 11/22/2017   BILITOT 1.2 11/22/2017   Lab Results  Component Value Date   WBC 7.4 11/22/2017   HGB 16.6 11/22/2017   HCT 47.2 11/22/2017   MCV 91.7 11/22/2017   PLT 217.0 11/22/2017   Lab Results  Component Value Date   TSH 1.66 11/22/2017     Hyperglycemia Lab Results  Component Value  Date   HGBA1C 6.1 11/22/2017   Up from 6.0- fairly stable  He cut back on ice cream /does not eat daily and eats much smaller amt Overall does not watch diet much  Hyperlipidemia (setting of CAD)  Lab Results  Component Value Date   CHOL 211 (H) 11/22/2017   CHOL 228 (H) 11/17/2016   CHOL 194 11/07/2015   Lab Results  Component Value Date   HDL 41.20 11/22/2017   HDL 53.90 11/17/2016   HDL 41.40 11/07/2015   Lab Results  Component Value Date   LDLCALC 138 (H) 11/22/2017   LDLCALC 155 (H) 11/17/2016   LDLCALC 128 (H) 11/07/2015   Lab Results  Component Value Date   TRIG 159.0 (H) 11/22/2017   TRIG 98.0 11/17/2016   TRIG 123.0 11/07/2015   Lab Results  Component Value Date   CHOLHDL 5 11/22/2017   CHOLHDL 4 11/17/2016   CHOLHDL 5 11/07/2015   No results found for: LDLDIRECT He is intolerant of statins  No longer sees cardiology   Patient Active Problem List   Diagnosis Date Noted  . Routine general medical  examination at a health care facility 11/09/2016  . Hernia, inguinal 06/06/2015  . History of fall 11/09/2014  . Encounter for Medicare annual wellness exam 11/06/2013  . Prostate cancer screening 05/09/2013  . Hyperglycemia 07/31/2011  . HTN (hypertension) 06/16/2011  . Night muscle spasms 01/21/2011  . Low back pain 01/16/2010  . CAD, AUTOLOGOUS BYPASS GRAFT 11/07/2008  . FLATULENCE-GAS-BLOATING 08/02/2008  . RUQ PAIN 07/10/2008  . URINARY CALCULUS 10/17/2007  . Coronary atherosclerosis 10/03/2007  . Hyperlipidemia 04/13/2007  . Hearing loss 04/13/2007  . RHINITIS, CHRONIC 04/13/2007  . GERD 04/13/2007  . Actinic keratosis 04/13/2007  . NEPHROLITHIASIS, HX OF 04/13/2007   Past Medical History:  Diagnosis Date  . Arthritis   . Bifascicular block   . Coronary artery disease    Post CABG in 2001 with patent grafts in 2010  (90% mid LAD stenosis with competing flow from LIMA distally, 90% OM stenosis with competing flow from SVG,  90% RCA stenosis  with competing flow from SVG, occluded SVG to Posterolateral branch).  . Gastritis   . GERD (gastroesophageal reflux disease)   . History of nephrolithiasis   . Hyperlipidemia   . Hypertension   . Partial tear of rotator cuff(726.13)    Right  . Psoriasis   . Unstable angina [facility])    Past Surgical History:  Procedure Laterality Date  . BACK SURGERY    . CARDIAC CATHETERIZATION  01/2000   Multi vessel PTCA  . CARDIAC CATHETERIZATION  11/2008   multi vesstel dz, re occlusion small vessel  . CORONARY ARTERY BYPASS GRAFT  2001  . CT ABD W & PELVIS WO CM  09/2001   Kidney stones  . CT ABD WO/W & PELVIS WO CM  10/14/2007   Low grade obstruction on R by 17mm prox ereteral calculus, Sm bilat renal calculi sm HH  . Diffuision of unstable angina  03/2000  . ESOPHAGOGASTRODUODENOSCOPY  08/2008   Mild gastritis and GERD  . INGUINAL HERNIA REPAIR Right 09/10/2015   Procedure: RIGHT INGUINAL HERNIA REPAIR WITH MESH;  Surgeon: Coralie Keens, MD;  Location: [place];  Service: General;  Laterality: Right;  . INSERTION OF MESH Right 09/10/2015   Procedure: INSERTION OF MESH;  Surgeon: Coralie Keens, MD;  Location: [place];  Service: General;  Laterality: Right;  . KIDNEY STONE SURGERY  10/2007   Removal R ureteral kidney stone  . NASAL SINUS SURGERY  04/2002  . NECK SURGERY    . Pelvic CT  10/14/2007   nml  . PTCA  01/2000   Subendocardial MI  . Stress cardiolite  10/2003   No acute changes  . TONSILLECTOMY     Social History   Tobacco Use  . Smoking status: Former Smoker    Last attempt to quit: 12/05/1999    Years since quitting: 18.0  . Smokeless tobacco: Never Used  Substance Use Topics  . Alcohol use: No    Alcohol/week: 0.0 oz  . Drug use: No   Family History  Problem Relation Age of Onset  . Transient ischemic attack Mother   . Coronary artery disease Mother   . Heart disease Mother        CAD  . Alcohol abuse Father   . Diabetes type II Father   . Diabetes Father   .  Heart disease Father        pacemaker   No Known Allergies Current Outpatient Medications on File Prior to Visit  Medication Sig Dispense Refill  . aspirin  EC 81 MG tablet Take 1 tablet (81 mg total) by mouth daily. 90 tablet 3  . latanoprost (XALATAN) 0.005 % ophthalmic solution Place 1 drop into both eyes at bedtime.    Benita Stabile HCL PO Take 25 mg by mouth 2 (two) times daily.    . Multiple Vitamin (MULTIVITAMIN) tablet Take 1 tablet by mouth daily.      Marland Kitchen ZINC OXIDE EX Apply topically.     No current facility-administered medications on file prior to visit.      Review of Systems  Constitutional: Negative for activity change, appetite change, fatigue, fever and unexpected weight change.  HENT: Negative for congestion, rhinorrhea, sore throat and trouble swallowing.   Eyes: Negative for pain, redness, itching and visual disturbance.  Respiratory: Negative for cough, chest tightness, shortness of breath and wheezing.   Cardiovascular: Negative for chest pain and palpitations.  Gastrointestinal: Negative for abdominal pain, blood in stool, constipation, diarrhea and nausea.  Endocrine: Negative for cold intolerance, heat intolerance, polydipsia and polyuria.  Genitourinary: Negative for difficulty urinating, dysuria, frequency and urgency.  Musculoskeletal: Positive for arthralgias and back pain. Negative for joint swelling and myalgias.       Knee pain   Skin: Negative for pallor and rash.  Neurological: Positive for dizziness. Negative for tremors, weakness, numbness and headaches.  Hematological: Negative for adenopathy. Does not bruise/bleed easily.  Psychiatric/Behavioral: Negative for decreased concentration and dysphoric mood. The patient is not nervous/anxious.        Objective:   Physical Exam  Constitutional: He appears well-developed and well-nourished. No distress.  Well appearing elderly male  HOH  HENT:  Head: Normocephalic and atraumatic.  Right Ear: External  ear normal.  Left Ear: External ear normal.  Nose: Nose normal.  Mouth/Throat: Oropharynx is clear and moist.  HOH  Scant cerumen  Eyes: Pupils are equal, round, and reactive to light. Conjunctivae and EOM are normal. Right eye exhibits no discharge. Left eye exhibits no discharge. No scleral icterus.  Neck: Normal range of motion. Neck supple. No JVD present. Carotid bruit is not present. No thyromegaly present.  Cardiovascular: Normal rate, regular rhythm, normal heart sounds and intact distal pulses. Exam reveals no gallop.  Pulmonary/Chest: Effort normal and breath sounds normal. No respiratory distress. He has no wheezes. He exhibits no tenderness.  Abdominal: Soft. Bowel sounds are normal. He exhibits no distension, no abdominal bruit and no mass. There is no tenderness.  Musculoskeletal: He exhibits no edema, tenderness or deformity.  Lymphadenopathy:    He has no cervical adenopathy.  Neurological: He is alert. He has normal reflexes. No cranial nerve deficit. He exhibits normal muscle tone. Coordination normal.  Skin: Skin is warm and dry. No rash noted. No erythema. No pallor.  Diffuse AKS and scars from old skin cancers  Scalp/face/arms Abraded area L lower lip (also licks his lips)   Psychiatric: He has a normal mood and affect.  Pleasant           Assessment & Plan:   Problem List Items Addressed This Visit      Cardiovascular and Mediastinum   HTN (hypertension)    bp in fair control at this time  BP Readings from Last 1 Encounters:  12/06/17 122/74   No changes needed Most recent labs reviewed  Disc lifstyle change with low sodium diet and exercise  Refilled hctz       Relevant Medications   hydrochlorothiazide (HYDRODIURIL) 50 MG tablet     Nervous and Auditory  Hearing loss    Had hearing aides-did not like them        Musculoskeletal and Integument   Actinic keratosis    Continue f/u with derm Area on L lower lip is abraded - will f/u with  them  Wears more protective clothing Also lip balm with sunscreen         Other   Hyperglycemia    Lab Results  Component Value Date   HGBA1C 6.1 11/22/2017   disc imp of low glycemic diet and wt loss to prevent DM2       Hyperlipidemia    Disc goals for lipids and reasons to control them Rev last labs with pt Rev low sat fat diet in detail LDL is down a bit  Intol to statins       Relevant Medications   hydrochlorothiazide (HYDRODIURIL) 50 MG tablet   Prostate cancer screening    No longer screening  No change in prostate/voiding symptoms and is not bothered Not going to urology currently      Routine general medical examination at a health care facility - Primary    Reviewed health habits including diet and exercise and skin cancer prevention Reviewed appropriate screening tests for age  Also reviewed health mt list, fam hx and immunization status , as well as social and family history   See HPI amw rev Labs rev  Declines colon screen and declines zoster vaccine  [facility]  Disc mood- still grief for loss of wife but doing ok  Suggested more socialization

## 2017-12-06 NOTE — Assessment & Plan Note (Signed)
bp in fair control at this time  BP Readings from Last 1 Encounters:  12/06/17 122/74   No changes needed Most recent labs reviewed  Disc lifstyle change with low sodium diet and exercise  Refilled hctz

## 2017-12-06 NOTE — Assessment & Plan Note (Signed)
No longer screening  No change in prostate/voiding symptoms and is not bothered Not going to urology currently

## 2018-08-12 ENCOUNTER — Encounter: Payer: Self-pay | Admitting: Primary Care

## 2018-08-12 ENCOUNTER — Ambulatory Visit (INDEPENDENT_AMBULATORY_CARE_PROVIDER_SITE_OTHER): Payer: Medicare Other | Admitting: Primary Care

## 2018-08-12 VITALS — BP 144/84 | HR 77 | Temp 97.7°F | Ht 70.0 in | Wt 190.0 lb

## 2018-08-12 DIAGNOSIS — J310 Chronic rhinitis: Secondary | ICD-10-CM | POA: Diagnosis not present

## 2018-08-12 MED ORDER — FLUTICASONE PROPIONATE 50 MCG/ACT NA SUSP: 1.0000 | Freq: Two times a day (BID) | NASAL | 0 refills | Status: DC

## 2018-08-12 NOTE — Patient Instructions (Signed)
Nasal Congestion/Ear Pressure/Sinus Pressure: Try using Flonase (fluticasone) nasal spray. Instill 1 spray in each nostril twice daily.   You can also start a medication like Claritin, Zyrtec, or Allegra for drainage.   Please call me Monday if you develop fevers, increased facial pain, feel worse.  It was a pleasure meeting you!

## 2018-08-12 NOTE — Progress Notes (Signed)
Subjective:    Patient ID: Joel [name], male    DOB: Nov 08, 1935, 83 y.o.   MRN: [id number]  HPI  Mr. Garron is an 83 year old male with a history of chronic rhinitis, hearing loss, hypertension who presents today with a chief complaint of right sided pain.  He endorses discomfort to the right lateral neck, right side of ear, and right side of his head. He also notices rawness to his nose. He's been saline spray mist into his nose with improvement. He's also using Tylenol with improvement.  He denies fevers, cough, joint aches. His symptoms began about 10 days ago, started feeling better this morning. He's not expelling much mucous from his nasal cavity, sometimes  Review of Systems  Constitutional: Negative for chills and fever.  HENT: Positive for postnasal drip and sinus pressure.   Respiratory: Negative for shortness of breath.   Cardiovascular: Negative for chest pain.       Past Medical History:  Diagnosis Date  . Arthritis   . Bifascicular block   . Coronary artery disease    Post CABG in 2001 with patent grafts in 2010  (90% mid LAD stenosis with competing flow from LIMA distally, 90% OM stenosis with competing flow from SVG,  90% RCA stenosis with competing flow from SVG, occluded SVG to Posterolateral branch).  . Gastritis   . GERD (gastroesophageal reflux disease)   . History of nephrolithiasis   . Hyperlipidemia   . Hypertension   . Partial tear of rotator cuff(726.13)    Right  . Psoriasis   . Unstable angina [facility])      Social History   Socioeconomic History  . Marital status: Widowed    Spouse name: Not on file  . Number of children: Not on file  . Years of education: Not on file  . Highest education level: Not on file  Occupational History  . Occupation: Retired Dispensing optician work    Fish farm manager: RETIRED  Social Needs  . Financial resource strain: Not on file  . Food insecurity:    Worry: Not on file    Inability: Not on file  . Transportation needs:   Medical: Not on file    Non-medical: Not on file  Tobacco Use  . Smoking status: Former Smoker    Last attempt to quit: 12/05/1999    Years since quitting: 18.6  . Smokeless tobacco: Never Used  Substance and Sexual Activity  . Alcohol use: No    Alcohol/week: 0.0 standard drinks  . Drug use: No  . Sexual activity: Not Currently  Lifestyle  . Physical activity:    Days per week: Not on file    Minutes per session: Not on file  . Stress: Not on file  Relationships  . Social connections:    Talks on phone: Not on file    Gets together: Not on file    Attends religious service: Not on file    Active member of club or organization: Not on file    Attends meetings of clubs or organizations: Not on file    Relationship status: Not on file  . Intimate partner violence:    Fear of current or ex partner: Not on file    Emotionally abused: Not on file    Physically abused: Not on file    Forced sexual activity: Not on file  Other Topics Concern  . Not on file  Social History Narrative   1 half brother, 3 half sisters  Wife died last year of lung cancer   2 children   Fishes and walks dog for exercise      Signed Designated Party Release form and gives Kathe Mariner, daughter [phone]. Can leave msg on ans machine    Past Surgical History:  Procedure Laterality Date  . BACK SURGERY    . CARDIAC CATHETERIZATION  01/2000   Multi vessel PTCA  . CARDIAC CATHETERIZATION  11/2008   multi vesstel dz, re occlusion small vessel  . CORONARY ARTERY BYPASS GRAFT  2001  . CT ABD W & PELVIS WO CM  09/2001   Kidney stones  . CT ABD WO/W & PELVIS WO CM  10/14/2007   Low grade obstruction on R by 33mm prox ereteral calculus, Sm bilat renal calculi sm HH  . Diffuision of unstable angina  03/2000  . ESOPHAGOGASTRODUODENOSCOPY  08/2008   Mild gastritis and GERD  . INGUINAL HERNIA REPAIR Right 09/10/2015   Procedure: RIGHT INGUINAL HERNIA REPAIR WITH MESH;  Surgeon: Coralie Keens, MD;   Location: [place];  Service: General;  Laterality: Right;  . INSERTION OF MESH Right 09/10/2015   Procedure: INSERTION OF MESH;  Surgeon: Coralie Keens, MD;  Location: [place];  Service: General;  Laterality: Right;  . KIDNEY STONE SURGERY  10/2007   Removal R ureteral kidney stone  . NASAL SINUS SURGERY  04/2002  . NECK SURGERY    . Pelvic CT  10/14/2007   nml  . PTCA  01/2000   Subendocardial MI  . Stress cardiolite  10/2003   No acute changes  . TONSILLECTOMY      Family History  Problem Relation Age of Onset  . Transient ischemic attack Mother   . Coronary artery disease Mother   . Heart disease Mother        CAD  . Alcohol abuse Father   . Diabetes type II Father   . Diabetes Father   . Heart disease Father        pacemaker    No Known Allergies  Current Outpatient Medications on File Prior to Visit  Medication Sig Dispense Refill  . aspirin EC 81 MG tablet Take 1 tablet (81 mg total) by mouth daily. 90 tablet 3  . hydrochlorothiazide (HYDRODIURIL) 50 MG tablet Take 1 tablet (50 mg total) by mouth daily. 90 tablet 3  . latanoprost (XALATAN) 0.005 % ophthalmic solution Place 1 drop into both eyes at bedtime.    Benita Stabile HCL PO Take 25 mg by mouth 2 (two) times daily.    . Multiple Vitamin (MULTIVITAMIN) tablet Take 1 tablet by mouth daily.      Marland Kitchen omeprazole (PRILOSEC) 20 MG capsule Take 1 capsule (20 mg total) by mouth daily as needed. For acid reflux 90 capsule 3  . ZINC OXIDE EX Apply topically.     No current facility-administered medications on file prior to visit.     BP (!) 144/84   Pulse 77   Temp 97.7 F (36.5 C) (Oral)   Ht 5\' 10"  (1.778 m)   Wt 190 lb (86.2 kg)   SpO2 99%   BMI 27.26 kg/m    Objective:   Physical Exam  Constitutional: He appears well-nourished. He does not appear ill.  HENT:  Right Ear: Ear canal normal. Tympanic membrane is bulging. Tympanic membrane is not erythematous.  Left Ear: Tympanic membrane and ear canal normal.  Tympanic membrane is not erythematous.  Nose: Mucosal edema present. Right sinus exhibits maxillary sinus tenderness. Right  sinus exhibits no frontal sinus tenderness. Left sinus exhibits no maxillary sinus tenderness and no frontal sinus tenderness.  Mouth/Throat: Oropharynx is clear and moist.  Neck: Neck supple.  Cardiovascular: Normal rate and regular rhythm.  Respiratory: Effort normal and breath sounds normal. He has no wheezes.  Skin: Skin is warm and dry.           Assessment & Plan:  Allergic Rhinitis vs Viral Sinusitis  Sinus pressure, ear pain, post nasal drip x 10 days. Overall improved today on OTC treatment. Exam today unremarkable. Doesn't appear sickly. Suspect allergy vs viral involvement and will treat with conservative measures. Rx for Flonase sent to pharmacy. Discussed use of antihistamine.  Return precautions provided.  Pleas Koch, NP

## 2018-08-30 ENCOUNTER — Encounter: Payer: Self-pay | Admitting: Family Medicine

## 2018-08-30 ENCOUNTER — Ambulatory Visit (INDEPENDENT_AMBULATORY_CARE_PROVIDER_SITE_OTHER): Payer: Medicare Other | Admitting: Family Medicine

## 2018-08-30 VITALS — BP 124/70 | HR 68 | Temp 97.8°F | Ht 70.0 in | Wt 185.5 lb

## 2018-08-30 DIAGNOSIS — J0100 Acute maxillary sinusitis, unspecified: Secondary | ICD-10-CM | POA: Diagnosis not present

## 2018-08-30 DIAGNOSIS — R42 Dizziness and giddiness: Secondary | ICD-10-CM | POA: Diagnosis not present

## 2018-08-30 DIAGNOSIS — J019 Acute sinusitis, unspecified: Secondary | ICD-10-CM | POA: Insufficient documentation

## 2018-08-30 MED ORDER — AMOXICILLIN-POT CLAVULANATE 875-125 MG PO TABS: 1.0000 | ORAL_TABLET | Freq: Two times a day (BID) | ORAL | 0 refills | Status: DC

## 2018-08-30 MED ORDER — MECLIZINE HCL 25 MG PO TABS: 25.0000 mg | ORAL_TABLET | Freq: Three times a day (TID) | ORAL | 0 refills | Status: DC | PRN

## 2018-08-30 NOTE — Assessment & Plan Note (Signed)
Now with congestion and purulent drainage /prod cough  Positional dizziness continues  Will tx with augmentin  Refilled meclizine for prn use (has helped in the past) Reassuring exam  Continue this bottle of flonase Update if not starting to improve in a week or if worsening  -if not improved may need to further w/o dizziness

## 2018-08-30 NOTE — Patient Instructions (Signed)
I think you have a sinus infection  Continue the flonase  Take augmentin (antibiotic) for 10 days   Take meclizine for dizziness only if you need it   Keep up a good fluid intake   Update if not starting to improve in a week or if worsening    If dizziness does NOT improve after finishing treatment-please let me know and we will look into this further

## 2018-08-30 NOTE — Assessment & Plan Note (Signed)
Suspect due to sinusitis (has had it in the past)  Px meclizine for prn use with caution of sedation  tx with augmentin  Update if not starting to improve in a week or if worsening   Consider further w/u if not improving

## 2018-08-30 NOTE — Progress Notes (Signed)
Subjective:    Patient ID: Joel [name], male    DOB: 06-28-1936, 83 y.o.   MRN: [id number]  HPI Here with sinus problems and dizziness   Also IBS (intermittent constipation and diarrhea)  Diarrhea for 3 weeks after a bout of n/v/d  Now he is constipated again    Wt Readings from Last 3 Encounters:  08/30/18 185 lb 8 oz (84.1 kg)  08/12/18 190 lb (86.2 kg)  12/06/17 185 lb 4 oz (84 kg)  26.62 kg/m   Has a hx of rhinitis and congestion   Last Saturday - he was sitting after walking in the woods  He tipped over/fell and did not hurt himself  Feels like his balance is bad  Lenon Oms earlier this month - px flonase (using that an zyrtec D )   Some productive cough- yellow and green phlegm (is thick)  May come from his sinuses  Cough is painful at times  Had a headache on the R side and then the L  Pain behind eyes and below them also   Patient Active Problem List   Diagnosis Date Noted  . Acute sinusitis 08/30/2018  . Dizziness 08/30/2018  . Routine general medical examination at a health care facility 11/09/2016  . Hernia, inguinal 06/06/2015  . History of fall 11/09/2014  . Encounter for Medicare annual wellness exam 11/06/2013  . Prostate cancer screening 05/09/2013  . Hyperglycemia 07/31/2011  . HTN (hypertension) 06/16/2011  . Night muscle spasms 01/21/2011  . Low back pain 01/16/2010  . CAD, AUTOLOGOUS BYPASS GRAFT 11/07/2008  . FLATULENCE-GAS-BLOATING 08/02/2008  . RUQ PAIN 07/10/2008  . URINARY CALCULUS 10/17/2007  . Coronary atherosclerosis 10/03/2007  . Hyperlipidemia 04/13/2007  . Hearing loss 04/13/2007  . RHINITIS, CHRONIC 04/13/2007  . GERD 04/13/2007  . Actinic keratosis 04/13/2007  . NEPHROLITHIASIS, HX OF 04/13/2007   Past Medical History:  Diagnosis Date  . Arthritis   . Bifascicular block   . Coronary artery disease    Post CABG in 2001 with patent grafts in 2010  (90% mid LAD stenosis with competing flow from LIMA distally, 90% OM  stenosis with competing flow from SVG,  90% RCA stenosis with competing flow from SVG, occluded SVG to Posterolateral branch).  . Gastritis   . GERD (gastroesophageal reflux disease)   . History of nephrolithiasis   . Hyperlipidemia   . Hypertension   . Partial tear of rotator cuff(726.13)    Right  . Psoriasis   . Unstable angina [facility])    Past Surgical History:  Procedure Laterality Date  . BACK SURGERY    . CARDIAC CATHETERIZATION  01/2000   Multi vessel PTCA  . CARDIAC CATHETERIZATION  11/2008   multi vesstel dz, re occlusion small vessel  . CORONARY ARTERY BYPASS GRAFT  2001  . CT ABD W & PELVIS WO CM  09/2001   Kidney stones  . CT ABD WO/W & PELVIS WO CM  10/14/2007   Low grade obstruction on R by 4mm prox ereteral calculus, Sm bilat renal calculi sm HH  . Diffuision of unstable angina  03/2000  . ESOPHAGOGASTRODUODENOSCOPY  08/2008   Mild gastritis and GERD  . INGUINAL HERNIA REPAIR Right 09/10/2015   Procedure: RIGHT INGUINAL HERNIA REPAIR WITH MESH;  Surgeon: Coralie Keens, MD;  Location: [place];  Service: General;  Laterality: Right;  . INSERTION OF MESH Right 09/10/2015   Procedure: INSERTION OF MESH;  Surgeon: Coralie Keens, MD;  Location: [place];  Service: General;  Laterality: Right;  . KIDNEY STONE SURGERY  10/2007   Removal R ureteral kidney stone  . NASAL SINUS SURGERY  04/2002  . NECK SURGERY    . Pelvic CT  10/14/2007   nml  . PTCA  01/2000   Subendocardial MI  . Stress cardiolite  10/2003   No acute changes  . TONSILLECTOMY     Social History   Tobacco Use  . Smoking status: Former Smoker    Last attempt to quit: 12/05/1999    Years since quitting: 18.7  . Smokeless tobacco: Never Used  Substance Use Topics  . Alcohol use: No    Alcohol/week: 0.0 standard drinks  . Drug use: No   Family History  Problem Relation Age of Onset  . Transient ischemic attack Mother   . Coronary artery disease Mother   . Heart disease Mother        CAD  . Alcohol  abuse Father   . Diabetes type II Father   . Diabetes Father   . Heart disease Father        pacemaker   No Known Allergies Current Outpatient Medications on File Prior to Visit  Medication Sig Dispense Refill  . aspirin EC 81 MG tablet Take 1 tablet (81 mg total) by mouth daily. 90 tablet 3  . cetirizine-pseudoephedrine (ZYRTEC-D) 5-120 MG tablet Take 1 tablet by mouth daily.    . fluticasone (FLONASE) 50 MCG/ACT nasal spray Place 1 spray into both nostrils 2 (two) times daily. 16 g 0  . hydrochlorothiazide (HYDRODIURIL) 50 MG tablet Take 1 tablet (50 mg total) by mouth daily. 90 tablet 3  . latanoprost (XALATAN) 0.005 % ophthalmic solution Place 1 drop into both eyes at bedtime.    . Multiple Vitamin (MULTIVITAMIN) tablet Take 1 tablet by mouth daily.      Marland Kitchen omeprazole (PRILOSEC) 20 MG capsule Take 1 capsule (20 mg total) by mouth daily as needed. For acid reflux 90 capsule 3  . ZINC OXIDE EX Apply topically.     No current facility-administered medications on file prior to visit.      Review of Systems  Constitutional: Negative for appetite change, fatigue and fever.  HENT: Positive for congestion, ear pain, hearing loss, postnasal drip, rhinorrhea, sinus pressure and sore throat. Negative for ear discharge, facial swelling and nosebleeds.   Eyes: Negative for pain, redness and itching.  Respiratory: Positive for cough. Negative for shortness of breath and wheezing.   Cardiovascular: Negative for chest pain.  Gastrointestinal: Negative for abdominal pain, diarrhea, nausea and vomiting.  Endocrine: Negative for polyuria.  Genitourinary: Negative for dysuria, frequency and urgency.  Musculoskeletal: Negative for arthralgias and myalgias.  Allergic/Immunologic: Negative for immunocompromised state.  Neurological: Positive for dizziness and headaches. Negative for tremors, syncope, facial asymmetry, speech difficulty, weakness, light-headedness and numbness.  Hematological: Negative  for adenopathy. Does not bruise/bleed easily.  Psychiatric/Behavioral: Negative for dysphoric mood. The patient is not nervous/anxious.        Objective:   Physical Exam Constitutional:      General: He is not in acute distress.    Appearance: Normal appearance. He is well-developed. He is not ill-appearing.  HENT:     Head: Normocephalic and atraumatic.     Right Ear: External ear normal.     Left Ear: External ear normal.     Ears:     Comments: Very HOH    Nose: Congestion and rhinorrhea present.     Mouth/Throat:     Pharynx:  No oropharyngeal exudate.  Eyes:     General:        Right eye: No discharge.        Left eye: No discharge.     Conjunctiva/sclera: Conjunctivae normal.     Pupils: Pupils are equal, round, and reactive to light.     Comments: Few beats of horizontal nystagmus bilat (this reproduces dizziness)   Neck:     Musculoskeletal: Normal range of motion and neck supple.  Cardiovascular:     Rate and Rhythm: Normal rate and regular rhythm.  Pulmonary:     Effort: Pulmonary effort is normal. No respiratory distress.     Breath sounds: Normal breath sounds. No wheezing or rales.     Comments: Diffusely distant bs No wheeze  No rales or rhonchi Some upper airway sounds  Chest:     Chest wall: No tenderness.  Musculoskeletal: Normal range of motion.  Lymphadenopathy:     Cervical: No cervical adenopathy.  Skin:    General: Skin is warm and dry.     Findings: No erythema or rash.  Neurological:     Mental Status: He is alert. Mental status is at baseline.     Cranial Nerves: No cranial nerve deficit.     Motor: No weakness.     Coordination: Coordination normal.     Gait: Gait normal.     Deep Tendon Reflexes: Reflexes normal.     Comments: Slow and steady gait   He feels unsteady during rhomberg test -but does not sway  Psychiatric:        Mood and Affect: Mood normal.           Assessment & Plan:   Problem List Items Addressed This Visit       Respiratory   Acute sinusitis - Primary    Now with congestion and purulent drainage /prod cough  Positional dizziness continues  Will tx with augmentin  Refilled meclizine for prn use (has helped in the past) Reassuring exam  Continue this bottle of flonase Update if not starting to improve in a week or if worsening  -if not improved may need to further w/o dizziness       Relevant Medications   cetirizine-pseudoephedrine (ZYRTEC-D) 5-120 MG tablet   amoxicillin-clavulanate (AUGMENTIN) 875-125 MG tablet     Other   Dizziness    Suspect due to sinusitis (has had it in the past)  Px meclizine for prn use with caution of sedation  tx with augmentin  Update if not starting to improve in a week or if worsening   Consider further w/u if not improving

## 2018-09-05 ENCOUNTER — Other Ambulatory Visit: Payer: Self-pay | Admitting: Primary Care

## 2018-09-05 DIAGNOSIS — J310 Chronic rhinitis: Secondary | ICD-10-CM

## 2018-12-07 ENCOUNTER — Ambulatory Visit: Payer: Medicare Other

## 2018-12-08 ENCOUNTER — Encounter: Payer: Medicare Other | Admitting: Family Medicine

## 2019-03-07 ENCOUNTER — Telehealth: Payer: Self-pay | Admitting: Family Medicine

## 2019-03-07 DIAGNOSIS — I10 Essential (primary) hypertension: Secondary | ICD-10-CM

## 2019-03-07 DIAGNOSIS — Z125 Encounter for screening for malignant neoplasm of prostate: Secondary | ICD-10-CM

## 2019-03-07 DIAGNOSIS — R7303 Prediabetes: Secondary | ICD-10-CM

## 2019-03-07 NOTE — Telephone Encounter (Signed)
-----   Message from Cloyd Stagers, RT sent at 03/03/2019 10:11 AM EDT ----- Regarding: Lab Orders for Wednesday 9.2.2020 Please place lab orders for Wednesday 9.2.2020, office visit for physical on Wednesday 9.9.2020 Thank you, Dyke Maes RT(R)

## 2019-03-08 ENCOUNTER — Ambulatory Visit: Payer: Medicare Other

## 2019-03-08 ENCOUNTER — Other Ambulatory Visit (INDEPENDENT_AMBULATORY_CARE_PROVIDER_SITE_OTHER): Payer: Medicare Other

## 2019-03-08 DIAGNOSIS — R7303 Prediabetes: Secondary | ICD-10-CM

## 2019-03-08 DIAGNOSIS — I10 Essential (primary) hypertension: Secondary | ICD-10-CM

## 2019-03-08 LAB — LIPID PANEL
Cholesterol: 206 mg/dL — ABNORMAL HIGH (ref 0–200)
HDL: 39.8 mg/dL (ref >39.00)
LDL Cholesterol: 126 mg/dL — ABNORMAL HIGH (ref 0–99)
NonHDL: 166.06
Total CHOL/HDL Ratio: 5
Triglycerides: 199 mg/dL — ABNORMAL HIGH (ref 0.0–149.0)
VLDL: 39.8 mg/dL (ref 0.0–40.0)

## 2019-03-08 LAB — COMPREHENSIVE METABOLIC PANEL
ALT: 18 U/L (ref 0–53)
AST: 16 U/L (ref 0–37)
Albumin: 4.1 g/dL (ref 3.5–5.2)
Alkaline Phosphatase: 72 U/L (ref 39–117)
BUN: 15 mg/dL (ref 6–23)
CO2: 30 mEq/L (ref 19–32)
Calcium: 9.4 mg/dL (ref 8.4–10.5)
Chloride: 104 mEq/L (ref 96–112)
Creatinine, Ser: 1.02 mg/dL (ref 0.40–1.50)
GFR: 70.1 mL/min (ref >60.00)
Glucose, Bld: 102 mg/dL — ABNORMAL HIGH (ref 70–99)
Potassium: 4.9 mEq/L (ref 3.5–5.1)
Sodium: 141 mEq/L (ref 135–145)
Total Bilirubin: 1.3 mg/dL — ABNORMAL HIGH (ref 0.2–1.2)
Total Protein: 6.4 g/dL (ref 6.0–8.3)

## 2019-03-08 LAB — CBC WITH DIFFERENTIAL/PLATELET
Basophils Absolute: 0 10*3/uL (ref 0.0–0.1)
Basophils Relative: 0.5 % (ref 0.0–3.0)
Eosinophils Absolute: 0.4 10*3/uL (ref 0.0–0.7)
Eosinophils Relative: 5 % (ref 0.0–5.0)
HCT: 48.3 % (ref 39.0–52.0)
Hemoglobin: 16.5 g/dL (ref 13.0–17.0)
Lymphocytes Relative: 31 % (ref 12.0–46.0)
Lymphs Abs: 2.4 10*3/uL (ref 0.7–4.0)
MCHC: 34.2 g/dL (ref 30.0–36.0)
MCV: 93.7 fl (ref 78.0–100.0)
Monocytes Absolute: 0.7 10*3/uL (ref 0.1–1.0)
Monocytes Relative: 9.2 % (ref 3.0–12.0)
Neutro Abs: 4.2 10*3/uL (ref 1.4–7.7)
Neutrophils Relative %: 54.3 % (ref 43.0–77.0)
Platelets: 213 10*3/uL (ref 150.0–400.0)
RBC: 5.15 Mil/uL (ref 4.22–5.81)
RDW: 12.6 % (ref 11.5–15.5)
WBC: 7.8 10*3/uL (ref 4.0–10.5)

## 2019-03-08 LAB — HEMOGLOBIN A1C: Hgb A1c MFr Bld: 6.3 % (ref 4.6–6.5)

## 2019-03-08 LAB — TSH: TSH: 1.84 u[IU]/mL (ref 0.35–4.50)

## 2019-03-15 ENCOUNTER — Ambulatory Visit (INDEPENDENT_AMBULATORY_CARE_PROVIDER_SITE_OTHER): Payer: Medicare Other | Admitting: Family Medicine

## 2019-03-15 ENCOUNTER — Other Ambulatory Visit: Payer: Self-pay

## 2019-03-15 ENCOUNTER — Encounter: Payer: Self-pay | Admitting: Family Medicine

## 2019-03-15 VITALS — BP 136/80 | HR 68 | Temp 98.0°F | Ht 70.25 in | Wt 183.5 lb

## 2019-03-15 DIAGNOSIS — I10 Essential (primary) hypertension: Secondary | ICD-10-CM

## 2019-03-15 DIAGNOSIS — R7303 Prediabetes: Secondary | ICD-10-CM

## 2019-03-15 DIAGNOSIS — H9193 Unspecified hearing loss, bilateral: Secondary | ICD-10-CM

## 2019-03-15 DIAGNOSIS — R42 Dizziness and giddiness: Secondary | ICD-10-CM | POA: Diagnosis not present

## 2019-03-15 DIAGNOSIS — E7800 Pure hypercholesterolemia, unspecified: Secondary | ICD-10-CM | POA: Diagnosis not present

## 2019-03-15 DIAGNOSIS — Z0000 Encounter for general adult medical examination without abnormal findings: Secondary | ICD-10-CM | POA: Diagnosis not present

## 2019-03-15 DIAGNOSIS — J310 Chronic rhinitis: Secondary | ICD-10-CM

## 2019-03-15 DIAGNOSIS — Z23 Encounter for immunization: Secondary | ICD-10-CM

## 2019-03-15 NOTE — Assessment & Plan Note (Signed)
bp in fair control at this time  BP Readings from Last 1 Encounters:  03/15/19 136/80   No changes needed Most recent labs reviewed  Disc lifstyle change with low sodium diet and exercise  In setting of CAD (pt declines further cardiology f/u)

## 2019-03-15 NOTE — Assessment & Plan Note (Signed)
Lab Results  Component Value Date   HGBA1C 6.3 03/08/2019   Up a bit disc imp of low glycemic diet and wt loss to prevent DM2

## 2019-03-15 NOTE — Assessment & Plan Note (Signed)
Reviewed health habits including diet and exercise and skin cancer prevention Reviewed appropriate screening tests for age  Also reviewed health mt list, fam hx and immunization status , as well as social and family history   Labs reviewed  Flu shot today  Declines colon cancer screen or shingrix vaccine  Noted falls -ref to ENT for dizziness and enc use of walker or cane (her refuses)  Has an adv directive Discussed cognitive concerns (short term memory) Due for eye/vision exam-he plans to schedule

## 2019-03-15 NOTE — Patient Instructions (Addendum)
I placed a referral to an ear/nose/throat specialist Our office will call you in the next 1-2 weeks to schedule that   If they determine that your dizziness is not from an inner ear problem then I may want to get a Cat Scan of your head  Please let me know if dizziness or other symptoms worsen in the meantime  Be careful not to fall  Use a cane/walking stick or walker if you need it   If you are interested in the new shingles vaccine (Shingrix) - call your local pharmacy to check on coverage and availability  If affordable, get on a wait list at your pharmacy to get the vaccine.  To prevent diabetes  Try to get most of your carbohydrates from produce (with the exception of white potatoes)  Eat less bread/pasta/rice/snack foods/cereals/sweets and other items from the middle of the grocery store (processed carbs)   Flu shot today (high dose)

## 2019-03-15 NOTE — Assessment & Plan Note (Signed)
More bothersome Unsure if adding to sinus symptoms and dizziness ENT ref done

## 2019-03-15 NOTE — Progress Notes (Signed)
Subjective:    Patient ID: Joel [name], male    DOB: 19-Feb-1936, 83 y.o.   MRN: [id number]  HPI Here for amw and routine annual health mt with rev of chronic health problems   I have personally reviewed the Medicare Annual Wellness questionnaire and have noted 1. The patient's medical and social history 2. Their use of alcohol, tobacco or illicit drugs 3. Their current medications and supplements 4. The patient's functional ability including ADL's, fall risks, home safety risks and hearing or visual             impairment. 5. Diet and physical activities 6. Evidence for depression or mood disorders  The patients weight, height, BMI have been recorded in the chart and visual acuity is per eye clinic.  I have made referrals, counseling and provided education to the patient based review of the above and I have provided the pt with a written personalized care plan for preventive services. Reviewed and updated provider list, see scanned forms.  See scanned forms.  Routine anticipatory guidance given to patient.  See health maintenance. Colon cancer screening-declines  Flu vaccine-today/high dose  Tetanus vaccine 5/14 Td Pneumovax-completed vaccines  Zoster vaccine-declines shingrix  Falls -he fell 2 weeks ago /no injury (just bruised), poor balance (mis stepped in a boat)  Refuses to use cane/walker - he has them just in case  Exercise -he keeps active and mows /does all of his housework  Prostate cancer screening-out aged  Advance directive -has a living will and power of attorney  Cognitive function addressed- see scanned forms- and if abnormal then additional documentation follows.  Memory is not as good -has to work harder to remember short term things  Looses things No confusion but thinking is slower- takes more time to work through a process  Uses a pill box-and stays very organized  Writes out his schedule  He does not work on computers    [place] and [place] reviewed  Meds,  vitals, and allergies reviewed.   ROS: See HPI.  Otherwise negative.    Weight : Wt Readings from Last 3 Encounters:  03/15/19 183 lb 8 oz (83.2 kg)  08/30/18 185 lb 8 oz (84.1 kg)  08/12/18 190 lb (86.2 kg)  eating less with dizziness perhaps/ in general eats "allright"  (lost 5-6 lb by his scales)  26.14 kg/m   Gets fruit/veg- has a garden   Hearing/vision:  Hearing Screening   125Hz  250Hz  500Hz  1000Hz  2000Hz  3000Hz  4000Hz  6000Hz  8000Hz   Right ear:           Left ear:           Comments: Pt has hearing aids  Vision Screening Comments: Vision screening not done due to vertigo he plans on scheduling an eye appointment     C/o dizziness today tx for sinus infx in feb and dizziness never got better Took augmentin and flonase  occ sore spots on head and sores in nose  Unsure if dizziness is positional  Headache behind eyes is improved  Chronic rhinitis and pnd as well    bp is stable today  Re check BP: 136/80   No cp or palpitations or headaches or edema  No side effects to medicines  BP Readings from Last 3 Encounters:  03/15/19 (!) 142/82  08/30/18 124/70  08/12/18 (!) 144/84     Lab Results  Component Value Date   CREATININE 1.02 03/08/2019   BUN 15 03/08/2019   NA 141 03/08/2019  K 4.9 03/08/2019   CL 104 03/08/2019   CO2 30 03/08/2019   Lab Results  Component Value Date   ALT 18 03/08/2019   AST 16 03/08/2019   ALKPHOS 72 03/08/2019   BILITOT 1.3 (H) 03/08/2019   Glucose 102 Lab Results  Component Value Date   WBC 7.8 03/08/2019   HGB 16.5 03/08/2019   HCT 48.3 03/08/2019   MCV 93.7 03/08/2019   PLT 213.0 03/08/2019   Lab Results  Component Value Date   TSH 1.84 03/08/2019    Hyperlipidemia Lab Results  Component Value Date   CHOL 206 (H) 03/08/2019   CHOL 211 (H) 11/22/2017   CHOL 228 (H) 11/17/2016   Lab Results  Component Value Date   HDL 39.80 03/08/2019   HDL 41.20 11/22/2017   HDL 53.90 11/17/2016   Lab Results   Component Value Date   LDLCALC 126 (H) 03/08/2019   LDLCALC 138 (H) 11/22/2017   LDLCALC 155 (H) 11/17/2016   Lab Results  Component Value Date   TRIG 199.0 (H) 03/08/2019   TRIG 159.0 (H) 11/22/2017   TRIG 98.0 11/17/2016   Lab Results  Component Value Date   CHOLHDL 5 03/08/2019   CHOLHDL 5 11/22/2017   CHOLHDL 4 11/17/2016   No results found for: LDLDIRECT Intolerant of statins Fair diet   Prediabetes Lab Results  Component Value Date   HGBA1C 6.3 03/08/2019   Up from 6.1  Does not need his px filled yet   Patient Active Problem List   Diagnosis Date Noted  . Dizziness 08/30/2018  . Routine general medical examination at a health care facility 11/09/2016  . Hernia, inguinal 06/06/2015  . History of fall 11/09/2014  . Encounter for Medicare annual wellness exam 11/06/2013  . Prostate cancer screening 05/09/2013  . Prediabetes 07/31/2011  . HTN (hypertension) 06/16/2011  . Night muscle spasms 01/21/2011  . Low back pain 01/16/2010  . CAD, AUTOLOGOUS BYPASS GRAFT 11/07/2008  . FLATULENCE-GAS-BLOATING 08/02/2008  . URINARY CALCULUS 10/17/2007  . Coronary atherosclerosis 10/03/2007  . Hyperlipidemia 04/13/2007  . Hearing loss 04/13/2007  . RHINITIS, CHRONIC 04/13/2007  . GERD 04/13/2007  . Actinic keratosis 04/13/2007  . NEPHROLITHIASIS, HX OF 04/13/2007   Past Medical History:  Diagnosis Date  . Arthritis   . Bifascicular block   . Coronary artery disease    Post CABG in 2001 with patent grafts in 2010  (90% mid LAD stenosis with competing flow from LIMA distally, 90% OM stenosis with competing flow from SVG,  90% RCA stenosis with competing flow from SVG, occluded SVG to Posterolateral branch).  . Gastritis   . GERD (gastroesophageal reflux disease)   . History of nephrolithiasis   . Hyperlipidemia   . Hypertension   . Partial tear of rotator cuff(726.13)    Right  . Psoriasis   . Unstable angina [facility])    Past Surgical History:  Procedure  Laterality Date  . BACK SURGERY    . CARDIAC CATHETERIZATION  01/2000   Multi vessel PTCA  . CARDIAC CATHETERIZATION  11/2008   multi vesstel dz, re occlusion small vessel  . CORONARY ARTERY BYPASS GRAFT  2001  . CT ABD W & PELVIS WO CM  09/2001   Kidney stones  . CT ABD WO/W & PELVIS WO CM  10/14/2007   Low grade obstruction on R by 69mm prox ereteral calculus, Sm bilat renal calculi sm HH  . Diffuision of unstable angina  03/2000  . ESOPHAGOGASTRODUODENOSCOPY  08/2008  Mild gastritis and GERD  . INGUINAL HERNIA REPAIR Right 09/10/2015   Procedure: RIGHT INGUINAL HERNIA REPAIR WITH MESH;  Surgeon: Coralie Keens, MD;  Location: [place];  Service: General;  Laterality: Right;  . INSERTION OF MESH Right 09/10/2015   Procedure: INSERTION OF MESH;  Surgeon: Coralie Keens, MD;  Location: [place];  Service: General;  Laterality: Right;  . KIDNEY STONE SURGERY  10/2007   Removal R ureteral kidney stone  . NASAL SINUS SURGERY  04/2002  . NECK SURGERY    . Pelvic CT  10/14/2007   nml  . PTCA  01/2000   Subendocardial MI  . Stress cardiolite  10/2003   No acute changes  . TONSILLECTOMY     Social History   Tobacco Use  . Smoking status: Former Smoker    Quit date: 12/05/1999    Years since quitting: 19.2  . Smokeless tobacco: Never Used  Substance Use Topics  . Alcohol use: No    Alcohol/week: 0.0 standard drinks  . Drug use: No   Family History  Problem Relation Age of Onset  . Transient ischemic attack Mother   . Coronary artery disease Mother   . Heart disease Mother        CAD  . Alcohol abuse Father   . Diabetes type II Father   . Diabetes Father   . Heart disease Father        pacemaker   No Known Allergies Current Outpatient Medications on File Prior to Visit  Medication Sig Dispense Refill  . aspirin EC 81 MG tablet Take 1 tablet (81 mg total) by mouth daily. 90 tablet 3  . cetirizine-pseudoephedrine (ZYRTEC-D) 5-120 MG tablet Take 1 tablet by mouth daily.    .  hydrochlorothiazide (HYDRODIURIL) 50 MG tablet Take 1 tablet (50 mg total) by mouth daily. 90 tablet 3  . latanoprost (XALATAN) 0.005 % ophthalmic solution Place 1 drop into both eyes at bedtime.    . Multiple Vitamin (MULTIVITAMIN) tablet Take 1 tablet by mouth daily.      Marland Kitchen omeprazole (PRILOSEC) 20 MG capsule Take 1 capsule (20 mg total) by mouth daily as needed. For acid reflux 90 capsule 3  . ZINC OXIDE EX Apply topically.     No current facility-administered medications on file prior to visit.      Review of Systems  Constitutional: Negative for activity change, appetite change, fatigue, fever and unexpected weight change.  HENT: Positive for postnasal drip and sinus pressure. Negative for congestion, rhinorrhea, sore throat and trouble swallowing.   Eyes: Negative for pain, redness, itching and visual disturbance.  Respiratory: Negative for cough, chest tightness, shortness of breath and wheezing.   Cardiovascular: Negative for chest pain and palpitations.  Gastrointestinal: Negative for abdominal pain, blood in stool, constipation, diarrhea and nausea.  Endocrine: Negative for cold intolerance, heat intolerance, polydipsia and polyuria.  Genitourinary: Negative for difficulty urinating, dysuria, frequency and urgency.  Musculoskeletal: Negative for arthralgias, joint swelling and myalgias.  Skin: Negative for pallor and rash.  Neurological: Positive for dizziness. Negative for tremors, seizures, syncope, facial asymmetry, speech difficulty, weakness, light-headedness, numbness and headaches.  Hematological: Negative for adenopathy. Does not bruise/bleed easily.  Psychiatric/Behavioral: Negative for decreased concentration and dysphoric mood. The patient is not nervous/anxious.        Objective:   Physical Exam Constitutional:      General: He is not in acute distress.    Appearance: Normal appearance. He is well-developed and normal weight. He is not  ill-appearing or diaphoretic.   HENT:     Head: Normocephalic and atraumatic.     Right Ear: Tympanic membrane, ear canal and external ear normal.     Left Ear: Tympanic membrane, ear canal and external ear normal.     Ears:     Comments: Hearing impaired    Nose: Nose normal. No congestion.     Mouth/Throat:     Mouth: Mucous membranes are moist.     Pharynx: Oropharynx is clear. No posterior oropharyngeal erythema.  Eyes:     General: No scleral icterus.       Right eye: No discharge.        Left eye: No discharge.     Conjunctiva/sclera: Conjunctivae normal.     Pupils: Pupils are equal, round, and reactive to light.     Comments: No nystagmus today  Neck:     Musculoskeletal: Normal range of motion and neck supple. No neck rigidity or muscular tenderness.     Thyroid: No thyromegaly.     Vascular: No carotid bruit or JVD.  Cardiovascular:     Rate and Rhythm: Normal rate and regular rhythm.     Pulses: Normal pulses.     Heart sounds: Normal heart sounds. No gallop.   Pulmonary:     Effort: Pulmonary effort is normal. No respiratory distress.     Breath sounds: Normal breath sounds. No wheezing or rales.     Comments: Good air exch Chest:     Chest wall: No tenderness.  Abdominal:     General: Bowel sounds are normal. There is no distension or abdominal bruit.     Palpations: Abdomen is soft. There is no mass.     Tenderness: There is no abdominal tenderness.     Hernia: No hernia is present.  Musculoskeletal:        General: No tenderness.     Right lower leg: No edema.     Left lower leg: No edema.  Lymphadenopathy:     Cervical: No cervical adenopathy.  Skin:    General: Skin is warm and dry.     Coloration: Skin is not pale.     Findings: No erythema or rash.     Comments: aks diffusely with scarring and solar damage/solar aging   Neurological:     Mental Status: He is alert.     Cranial Nerves: No cranial nerve deficit.     Motor: No abnormal muscle tone.     Coordination: Coordination  normal.     Gait: Gait normal.     Deep Tendon Reflexes: Reflexes are normal and symmetric. Reflexes normal.     Comments: Very slow gait due to dizziness   Psychiatric:        Mood and Affect: Mood normal.        Cognition and Memory: Cognition and memory normal.     Comments: Mentally sharp Hearing impairment interferes with interview            Assessment & Plan:   Problem List Items Addressed This Visit      Cardiovascular and Mediastinum   HTN (hypertension)    bp in fair control at this time  BP Readings from Last 1 Encounters:  03/15/19 136/80   No changes needed Most recent labs reviewed  Disc lifstyle change with low sodium diet and exercise  In setting of CAD (pt declines further cardiology f/u)        Respiratory   RHINITIS, CHRONIC  More bothersome Unsure if adding to sinus symptoms and dizziness ENT ref done      Relevant Orders   Ambulatory referral to ENT     Nervous and Auditory   Hearing loss    Still quite HOH with hearing aides         Other   Hyperlipidemia    Disc goals for lipids and reasons to control them Rev last labs with pt Rev low sat fat diet in detail Intolerant of any statins  In setting of CAD  LDL is down a bit to 126 (goal under 70) HDL goal is over 40  Rev diet -fair  Doubt PCYK9 would be covered with LDL this low      Prediabetes    Lab Results  Component Value Date   HGBA1C 6.3 03/08/2019   Up a bit disc imp of low glycemic diet and wt loss to prevent DM2       Encounter for Medicare annual wellness exam - Primary    Reviewed health habits including diet and exercise and skin cancer prevention Reviewed appropriate screening tests for age  Also reviewed health mt list, fam hx and immunization status , as well as social and family history   Labs reviewed  Flu shot today  Declines colon cancer screen or shingrix vaccine  Noted falls -ref to ENT for dizziness and enc use of walker or cane (her refuses)   Has an adv directive Discussed cognitive concerns (short term memory) Due for eye/vision exam-he plans to schedule       Routine general medical examination at a health care facility    Reviewed health habits including diet and exercise and skin cancer prevention Reviewed appropriate screening tests for age  Also reviewed health mt list, fam hx and immunization status , as well as social and family history   Labs reviewed  Flu shot today  Declines colon cancer screen or shingrix vaccine  Noted falls -ref to ENT for dizziness and enc use of walker or cane (her refuses)  Has an adv directive Discussed cognitive concerns (short term memory) Due for eye/vision exam-he plans to schedule      Dizziness    Ongoing since tx of sinusitis in February  No imp with meclizine  Worse with position change One fall  C/o sinus headaches Ref to ENT for further eval  If no imp or cause found will consider neuro imaging      Relevant Orders   Ambulatory referral to ENT    Other Visit Diagnoses    Need for influenza vaccination       Relevant Orders   Flu Vaccine QUAD High Dose(Fluad) (Completed)

## 2019-03-15 NOTE — Assessment & Plan Note (Signed)
Still quite HOH with hearing aides

## 2019-03-15 NOTE — Assessment & Plan Note (Signed)
Disc goals for lipids and reasons to control them Rev last labs with pt Rev low sat fat diet in detail Intolerant of any statins  In setting of CAD  LDL is down a bit to 126 (goal under 70) HDL goal is over 40  Rev diet -fair  Doubt PCYK9 would be covered with LDL this low

## 2019-03-15 NOTE — Assessment & Plan Note (Signed)
Ongoing since tx of sinusitis in February  No imp with meclizine  Worse with position change One fall  C/o sinus headaches Ref to ENT for further eval  If no imp or cause found will consider neuro imaging

## 2019-04-07 ENCOUNTER — Telehealth: Payer: Self-pay | Admitting: *Deleted

## 2019-04-07 NOTE — Telephone Encounter (Signed)
Received fax from [facility] in [place] requesting immunization record.  Immunization record faxed to [phone] as requested.

## 2019-12-13 ENCOUNTER — Telehealth: Payer: Self-pay | Admitting: *Deleted

## 2019-12-13 ENCOUNTER — Emergency Department (HOSPITAL_COMMUNITY): Payer: Medicare Other

## 2019-12-13 ENCOUNTER — Inpatient Hospital Stay (HOSPITAL_COMMUNITY)
Admission: EM | Admit: 2019-12-13 | Discharge: 2019-12-15 | DRG: 178 | Disposition: A | Discharge status: Home / Self Care | Payer: Medicare Other | Attending: Student | Admitting: Student

## 2019-12-13 ENCOUNTER — Other Ambulatory Visit: Payer: Self-pay

## 2019-12-13 ENCOUNTER — Observation Stay (HOSPITAL_COMMUNITY): Payer: Medicare Other

## 2019-12-13 ENCOUNTER — Encounter (HOSPITAL_COMMUNITY): Payer: Self-pay | Admitting: Internal Medicine

## 2019-12-13 DIAGNOSIS — J90 Pleural effusion, not elsewhere classified: Secondary | ICD-10-CM | POA: Diagnosis not present

## 2019-12-13 DIAGNOSIS — J918 Pleural effusion in other conditions classified elsewhere: Secondary | ICD-10-CM | POA: Diagnosis not present

## 2019-12-13 DIAGNOSIS — J310 Chronic rhinitis: Secondary | ICD-10-CM | POA: Diagnosis not present

## 2019-12-13 DIAGNOSIS — R05 Cough: Secondary | ICD-10-CM | POA: Diagnosis not present

## 2019-12-13 DIAGNOSIS — Z79899 Other long term (current) drug therapy: Secondary | ICD-10-CM | POA: Diagnosis not present

## 2019-12-13 DIAGNOSIS — Z8249 Family history of ischemic heart disease and other diseases of the circulatory system: Secondary | ICD-10-CM

## 2019-12-13 DIAGNOSIS — L409 Psoriasis, unspecified: Secondary | ICD-10-CM | POA: Diagnosis not present

## 2019-12-13 DIAGNOSIS — Z951 Presence of aortocoronary bypass graft: Secondary | ICD-10-CM

## 2019-12-13 DIAGNOSIS — J690 Pneumonitis due to inhalation of food and vomit: Principal | ICD-10-CM | POA: Diagnosis present

## 2019-12-13 DIAGNOSIS — I119 Hypertensive heart disease without heart failure: Secondary | ICD-10-CM | POA: Diagnosis not present

## 2019-12-13 DIAGNOSIS — R1312 Dysphagia, oropharyngeal phase: Secondary | ICD-10-CM | POA: Diagnosis not present

## 2019-12-13 DIAGNOSIS — H409 Unspecified glaucoma: Secondary | ICD-10-CM | POA: Diagnosis not present

## 2019-12-13 DIAGNOSIS — R5381 Other malaise: Secondary | ICD-10-CM | POA: Diagnosis not present

## 2019-12-13 DIAGNOSIS — Z4682 Encounter for fitting and adjustment of non-vascular catheter: Secondary | ICD-10-CM | POA: Diagnosis not present

## 2019-12-13 DIAGNOSIS — I2510 Atherosclerotic heart disease of native coronary artery without angina pectoris: Secondary | ICD-10-CM | POA: Diagnosis present

## 2019-12-13 DIAGNOSIS — Z7982 Long term (current) use of aspirin: Secondary | ICD-10-CM | POA: Diagnosis not present

## 2019-12-13 DIAGNOSIS — K219 Gastro-esophageal reflux disease without esophagitis: Secondary | ICD-10-CM | POA: Diagnosis not present

## 2019-12-13 DIAGNOSIS — D72825 Bandemia: Secondary | ICD-10-CM | POA: Diagnosis not present

## 2019-12-13 DIAGNOSIS — I10 Essential (primary) hypertension: Secondary | ICD-10-CM | POA: Diagnosis present

## 2019-12-13 DIAGNOSIS — Z87891 Personal history of nicotine dependence: Secondary | ICD-10-CM | POA: Diagnosis not present

## 2019-12-13 DIAGNOSIS — Z87442 Personal history of urinary calculi: Secondary | ICD-10-CM | POA: Diagnosis not present

## 2019-12-13 DIAGNOSIS — H9190 Unspecified hearing loss, unspecified ear: Secondary | ICD-10-CM | POA: Diagnosis present

## 2019-12-13 DIAGNOSIS — Z20822 Contact with and (suspected) exposure to covid-19: Secondary | ICD-10-CM | POA: Diagnosis present

## 2019-12-13 DIAGNOSIS — R0602 Shortness of breath: Secondary | ICD-10-CM

## 2019-12-13 DIAGNOSIS — J948 Other specified pleural conditions: Secondary | ICD-10-CM | POA: Diagnosis not present

## 2019-12-13 DIAGNOSIS — Z85828 Personal history of other malignant neoplasm of skin: Secondary | ICD-10-CM | POA: Diagnosis not present

## 2019-12-13 DIAGNOSIS — E785 Hyperlipidemia, unspecified: Secondary | ICD-10-CM | POA: Diagnosis present

## 2019-12-13 DIAGNOSIS — Z9689 Presence of other specified functional implants: Secondary | ICD-10-CM

## 2019-12-13 LAB — CBC WITH DIFFERENTIAL/PLATELET
Abs Immature Granulocytes: 0.02 10*3/uL (ref 0.00–0.07)
Basophils Absolute: 0.1 10*3/uL (ref 0.0–0.1)
Basophils Relative: 1 %
Eosinophils Absolute: 0.6 10*3/uL — ABNORMAL HIGH (ref 0.0–0.5)
Eosinophils Relative: 6 %
HCT: 48.2 % (ref 39.0–52.0)
Hemoglobin: 15.6 g/dL (ref 13.0–17.0)
Immature Granulocytes: 0 %
Lymphocytes Relative: 19 %
Lymphs Abs: 1.7 10*3/uL (ref 0.7–4.0)
MCH: 30.6 pg (ref 26.0–34.0)
MCHC: 32.4 g/dL (ref 30.0–36.0)
MCV: 94.7 fL (ref 80.0–100.0)
Monocytes Absolute: 0.9 10*3/uL (ref 0.1–1.0)
Monocytes Relative: 10 %
Neutro Abs: 5.7 10*3/uL (ref 1.7–7.7)
Neutrophils Relative %: 64 %
Platelets: 348 10*3/uL (ref 150–400)
RBC: 5.09 MIL/uL (ref 4.22–5.81)
RDW: 12 % (ref 11.5–15.5)
WBC: 9 10*3/uL (ref 4.0–10.5)
nRBC: 0 % (ref 0.0–0.2)

## 2019-12-13 LAB — LACTATE DEHYDROGENASE, PLEURAL OR PERITONEAL FLUID: LD, Fluid: 322 U/L — ABNORMAL HIGH (ref 3–23)

## 2019-12-13 LAB — PROTEIN, PLEURAL OR PERITONEAL FLUID: Total protein, fluid: 4.1 g/dL

## 2019-12-13 LAB — HEPATIC FUNCTION PANEL
ALT: 22 U/L (ref 0–44)
AST: 23 U/L (ref 15–41)
Albumin: 3.2 g/dL — ABNORMAL LOW (ref 3.5–5.0)
Alkaline Phosphatase: 84 U/L (ref 38–126)
Bilirubin, Direct: <0.1 mg/dL (ref 0.0–0.2)
Total Bilirubin: 0.8 mg/dL (ref 0.3–1.2)
Total Protein: 6.2 g/dL — ABNORMAL LOW (ref 6.5–8.1)

## 2019-12-13 LAB — BASIC METABOLIC PANEL
Anion gap: 10 (ref 5–15)
BUN: 13 mg/dL (ref 8–23)
CO2: 23 mmol/L (ref 22–32)
Calcium: 8.5 mg/dL — ABNORMAL LOW (ref 8.9–10.3)
Chloride: 106 mmol/L (ref 98–111)
Creatinine, Ser: 0.85 mg/dL (ref 0.61–1.24)
GFR calc Af Amer: >60 mL/min (ref >60)
GFR calc non Af Amer: >60 mL/min (ref >60)
Glucose, Bld: 112 mg/dL — ABNORMAL HIGH (ref 70–99)
Potassium: 3.4 mmol/L — ABNORMAL LOW (ref 3.5–5.1)
Sodium: 139 mmol/L (ref 135–145)

## 2019-12-13 LAB — BRAIN NATRIURETIC PEPTIDE: B Natriuretic Peptide: 89.3 pg/mL (ref 0.0–100.0)

## 2019-12-13 LAB — LACTIC ACID, PLASMA: Lactic Acid, Venous: 1.3 mmol/L (ref 0.5–1.9)

## 2019-12-13 LAB — BODY FLUID CELL COUNT WITH DIFFERENTIAL
Eos, Fluid: 65 %
Lymphs, Fluid: 22 %
Monocyte-Macrophage-Serous Fluid: 8 % — ABNORMAL LOW (ref 50–90)
Neutrophil Count, Fluid: 5 % (ref 0–25)
Total Nucleated Cell Count, Fluid: 470 cu mm (ref 0–1000)

## 2019-12-13 LAB — LACTATE DEHYDROGENASE: LDH: 113 U/L (ref 98–192)

## 2019-12-13 LAB — GLUCOSE, PLEURAL OR PERITONEAL FLUID: Glucose, Fluid: 107 mg/dL

## 2019-12-13 LAB — LIPASE, BLOOD: Lipase: 22 U/L (ref 11–51)

## 2019-12-13 LAB — TROPONIN I (HIGH SENSITIVITY): Troponin I (High Sensitivity): 8 ng/L (ref <18)

## 2019-12-13 LAB — SARS CORONAVIRUS 2 BY RT PCR (HOSPITAL ORDER, PERFORMED IN ~~LOC~~ HOSPITAL LAB): SARS Coronavirus 2: NEGATIVE

## 2019-12-13 MED ORDER — SODIUM CHLORIDE 0.9% FLUSH
10.0000 mL | Freq: Three times a day (TID) | INTRAVENOUS | Status: DC
  Administered 2019-12-13: 10 mL
  Administered 2019-12-14: 3 mL

## 2019-12-13 MED ORDER — IOHEXOL 350 MG/ML SOLN
75.0000 mL | Freq: Once | INTRAVENOUS | Status: AC | PRN
  Administered 2019-12-13: 75 mL via INTRAVENOUS

## 2019-12-13 MED ORDER — FENTANYL CITRATE (PF) 100 MCG/2ML IJ SOLN
12.5000 ug | Freq: Once | INTRAMUSCULAR | Status: AC
  Administered 2019-12-13: 12.5 ug via INTRAVENOUS
  Filled 2019-12-13: qty 2

## 2019-12-13 MED ORDER — HYDROCHLOROTHIAZIDE 25 MG PO TABS
25.0000 mg | ORAL_TABLET | Freq: Every day | ORAL | Status: DC
  Administered 2019-12-14: 25 mg via ORAL
  Filled 2019-12-13: qty 1

## 2019-12-13 MED ORDER — LATANOPROST 0.005 % OP SOLN
1.0000 [drp] | Freq: Every day | OPHTHALMIC | Status: DC
  Administered 2019-12-13 – 2019-12-14 (×2): 1 [drp] via OPHTHALMIC
  Filled 2019-12-13: qty 2.5

## 2019-12-13 MED ORDER — ASPIRIN EC 81 MG PO TBEC
81.0000 mg | DELAYED_RELEASE_TABLET | Freq: Every day | ORAL | Status: DC
  Administered 2019-12-14: 81 mg via ORAL
  Filled 2019-12-13: qty 1

## 2019-12-13 MED ORDER — PANTOPRAZOLE SODIUM 40 MG PO TBEC
40.0000 mg | DELAYED_RELEASE_TABLET | Freq: Every day | ORAL | Status: DC
  Administered 2019-12-14: 40 mg via ORAL
  Filled 2019-12-13: qty 1

## 2019-12-13 MED ORDER — ENOXAPARIN SODIUM 40 MG/0.4ML ~~LOC~~ SOLN
40.0000 mg | SUBCUTANEOUS | Status: DC
  Administered 2019-12-14: 40 mg via SUBCUTANEOUS
  Filled 2019-12-13: qty 0.4

## 2019-12-13 MED ORDER — TRAMADOL HCL 50 MG PO TABS
50.0000 mg | ORAL_TABLET | Freq: Three times a day (TID) | ORAL | Status: DC | PRN
  Administered 2019-12-14: 50 mg via ORAL
  Filled 2019-12-13: qty 1

## 2019-12-13 NOTE — ED Provider Notes (Signed)
[facility] EMERGENCY DEPARTMENT Provider Note   CSN: [id number] Arrival date & time: 12/13/19  5701     History Chief Complaint  Patient presents with  . Shortness of Breath    Joel [name] is a 84 y.o. male.  The history is provided by the patient.  Shortness of Breath Severity:  Moderate Onset quality:  Gradual Timing:  Constant Progression:  Worsening Chronicity:  New Context comment:  [facility] Had a CT of abdomen and pelvis several weeks ago as they were concerned for kidney stone. Has had progressive SOB, chills, cough, production. Former smoker. Relieved by:  Rest Worsened by:  Nothing Associated symptoms: cough and sputum production   Associated symptoms: no abdominal pain, no chest pain, no claudication, no ear pain, no fever, no rash, no sore throat and no vomiting   Risk factors: no hx of PE/DVT   Risk factors comment:  CAD      Past Medical History:  Diagnosis Date  . Arthritis   . Bifascicular block   . Coronary artery disease    Post CABG in 2001 with patent grafts in 2010  (90% mid LAD stenosis with competing flow from LIMA distally, 90% OM stenosis with competing flow from SVG,  90% RCA stenosis with competing flow from SVG, occluded SVG to Posterolateral branch).  . Gastritis   . GERD (gastroesophageal reflux disease)   . History of nephrolithiasis   . Hyperlipidemia   . Hypertension   . Partial tear of rotator cuff(726.13)    Right  . Psoriasis     Patient Active Problem List   Diagnosis Date Noted  . Dizziness 08/30/2018  . Routine general medical examination at a health care facility 11/09/2016  . Hernia, inguinal 06/06/2015  . History of fall 11/09/2014  . Encounter for Medicare annual wellness exam 11/06/2013  . Prostate cancer screening 05/09/2013  . Prediabetes 07/31/2011  . HTN (hypertension) 06/16/2011  . Night muscle spasms 01/21/2011  . Low back pain  01/16/2010  . CAD, AUTOLOGOUS BYPASS GRAFT 11/07/2008  . FLATULENCE-GAS-BLOATING 08/02/2008  . URINARY CALCULUS 10/17/2007  . Coronary atherosclerosis 10/03/2007  . Hyperlipidemia 04/13/2007  . Hearing loss 04/13/2007  . RHINITIS, CHRONIC 04/13/2007  . GERD 04/13/2007  . Actinic keratosis 04/13/2007  . NEPHROLITHIASIS, HX OF 04/13/2007    Past Surgical History:  Procedure Laterality Date  . BACK SURGERY    . CARDIAC CATHETERIZATION  01/2000   Multi vessel PTCA  . CARDIAC CATHETERIZATION  11/2008   multi vesstel dz, re occlusion small vessel  . CORONARY ARTERY BYPASS GRAFT  2001  . CT ABD W & PELVIS WO CM  09/2001   Kidney stones  . CT ABD WO/W & PELVIS WO CM  10/14/2007   Low grade obstruction on R by 6mm prox ereteral calculus, Sm bilat renal calculi sm HH  . Diffuision of unstable angina  03/2000  . ESOPHAGOGASTRODUODENOSCOPY  08/2008   Mild gastritis and GERD  . INGUINAL HERNIA REPAIR Right 09/10/2015   Procedure: RIGHT INGUINAL HERNIA REPAIR WITH MESH;  Surgeon: Coralie Keens, MD;  Location: [facility];  Service: General;  Laterality: Right;  . INSERTION OF MESH Right 09/10/2015   Procedure: INSERTION OF MESH;  Surgeon: Coralie Keens, MD;  Location: [facility];  Service: General;  Laterality: Right;  . KIDNEY STONE SURGERY  10/2007   Removal R ureteral kidney stone  . NASAL SINUS SURGERY  04/2002  .  NECK SURGERY    . Pelvic CT  10/14/2007   nml  . PTCA  01/2000   Subendocardial MI  . Stress cardiolite  10/2003   No acute changes  . TONSILLECTOMY         Family History  Problem Relation Age of Onset  . Transient ischemic attack Mother   . Coronary artery disease Mother   . Heart disease Mother        CAD  . Alcohol abuse Father   . Diabetes type II Father   . Diabetes Father   . Heart disease Father        pacemaker    Social History   Tobacco Use  . Smoking status: Former Smoker    Quit date: 12/05/1999    Years since quitting: 20.0  . Smokeless tobacco:  Never Used  Substance Use Topics  . Alcohol use: No    Alcohol/week: 0.0 standard drinks  . Drug use: No    Home Medications Prior to Admission medications   Medication Sig Start Date End Date Taking? Authorizing Provider  aspirin EC 81 MG tablet Take 1 tablet (81 mg total) by mouth daily. 11/24/16  Yes Tower, Wynelle Fanny, MD  Dextromethorphan-guaiFENesin (ROBITUSSIN COUGH+CHEST CONG DM) 20-200 MG/20ML LIQD Take 20 mLs by mouth at bedtime as needed (coughing).   Yes [provider]  hydrochlorothiazide (HYDRODIURIL) 50 MG tablet Take 1 tablet (50 mg total) by mouth daily. 12/06/17  Yes Tower, Marne A, MD  latanoprost (XALATAN) 0.005 % ophthalmic solution Place 1 drop into both eyes at bedtime.   Yes [provider]  Multiple Vitamin (MULTIVITAMIN) tablet Take 1 tablet by mouth daily.     Yes [provider]  Omega-3 Fatty Acids (FISH OIL) 1000 MG CAPS Take 1 capsule by mouth daily.   Yes [provider]  omeprazole (PRILOSEC) 20 MG capsule Take 1 capsule (20 mg total) by mouth daily as needed. For acid reflux Patient taking differently: Take 20 mg by mouth daily. For acid reflux 12/06/17  Yes Tower, Wynelle Fanny, MD    Allergies    Patient has no known allergies.  Review of Systems   Review of Systems  Constitutional: Negative for chills and fever.  HENT: Negative for ear pain and sore throat.   Eyes: Negative for pain and visual disturbance.  Respiratory: Positive for cough, sputum production and shortness of breath.   Cardiovascular: Negative for chest pain, palpitations and claudication.  Gastrointestinal: Negative for abdominal pain and vomiting.  Genitourinary: Negative for dysuria and hematuria.  Musculoskeletal: Negative for arthralgias and back pain.  Skin: Negative for color change and rash.  Neurological: Negative for seizures and syncope.  All other systems reviewed and are negative.   Physical Exam Updated Vital Signs  ED Triage Vitals  Enc  Vitals Group     BP 12/13/19 0947 (!) 159/82     Pulse Rate 12/13/19 0947 75     Resp 12/13/19 0947 16     Temp 12/13/19 0947 98.2 F (36.8 C)     Temp Source 12/13/19 0947 Oral     SpO2 12/13/19 0947 95 %     Weight --      Height --      Head Circumference --      Peak Flow --      Pain Score 12/13/19 1020 8     Pain Loc --      Pain Edu? --      Excl. in  GC? --     Physical Exam Vitals and nursing note reviewed.  Constitutional:      General: He is not in acute distress.    Appearance: He is well-developed. He is not ill-appearing.  HENT:     Head: Normocephalic and atraumatic.  Eyes:     Conjunctiva/sclera: Conjunctivae normal.     Pupils: Pupils are equal, round, and reactive to light.  Cardiovascular:     Rate and Rhythm: Normal rate and regular rhythm.     Pulses: Normal pulses.     Heart sounds: Normal heart sounds. No murmur.  Pulmonary:     Effort: Tachypnea present. No respiratory distress.     Breath sounds: Examination of the right-middle field reveals decreased breath sounds. Examination of the right-lower field reveals decreased breath sounds. Decreased breath sounds present.  Abdominal:     Palpations: Abdomen is soft.     Tenderness: There is no abdominal tenderness.  Musculoskeletal:        General: Normal range of motion.     Cervical back: Normal range of motion and neck supple.     Right lower leg: No edema.     Left lower leg: No edema.  Skin:    General: Skin is warm and dry.  Neurological:     General: No focal deficit present.     Mental Status: He is alert.  Psychiatric:        Mood and Affect: Mood normal.     ED Results / Procedures / Treatments   Labs (all labs ordered are listed, but only abnormal results are displayed) Labs Reviewed  CBC WITH DIFFERENTIAL/PLATELET - Abnormal; Notable for the following components:      Result Value   Eosinophils Absolute 0.6 (*)    All other components within normal limits  BASIC METABOLIC  PANEL - Abnormal; Notable for the following components:   Potassium 3.4 (*)    Glucose, Bld 112 (*)    Calcium 8.5 (*)    All other components within normal limits  HEPATIC FUNCTION PANEL - Abnormal; Notable for the following components:   Total Protein 6.2 (*)    Albumin 3.2 (*)    All other components within normal limits  CULTURE, BLOOD (ROUTINE X 2)  CULTURE, BLOOD (ROUTINE X 2)  SARS CORONAVIRUS 2 BY RT PCR (HOSPITAL ORDER, [facility])  BRAIN NATRIURETIC PEPTIDE  LACTIC ACID, PLASMA  LIPASE, BLOOD  LACTATE DEHYDROGENASE  LACTIC ACID, PLASMA  TROPONIN I (HIGH SENSITIVITY)    EKG EKG Interpretation  Date/Time:  Wednesday December 13 2019 10:17:56 EDT Ventricular Rate:  81 PR Interval:  108 QRS Duration: 136 QT Interval:  408 QTC Calculation: 473 R Axis:   -46 Text Interpretation: Sinus rhythm with short PR Right bundle branch block Left anterior fascicular block bifasicular block No significant change since last tracing Confirmed by Lennice Sites [phone]) on 12/13/2019 11:02:18 AM   Radiology DG Chest 2 View  Result Date: 12/13/2019 CLINICAL DATA:  Shortness of breath and right-sided chest pain, initial encounter EXAM: CHEST - 2 VIEW COMPARISON:  10/24/2013 FINDINGS: Cardiac shadow is stable. Postsurgical changes are again noted. Left lung is clear. Right-sided effusion with likely underlying atelectasis is noted. No rib abnormality is noted. Degenerative changes of the thoracic spine are seen. IMPRESSION: Right-sided pleural effusion with likely underlying atelectasis/infiltrate Electronically Signed   By: Inez Catalina M.D.   On: 12/13/2019 11:04    Procedures Procedures (including critical care time)  Medications Ordered in ED Medications  iohexol (OMNIPAQUE) 350 MG/ML injection 75 mL (75 mLs Intravenous Contrast Given 12/13/19 1455)    ED Course  I have reviewed the triage vital signs and the nursing notes.  Pertinent labs & imaging results  that were available during my care of the patient were reviewed by me and considered in my medical decision making (see chart for details).    MDM Rules/Calculators/A&P                      Joel [name] is an 84 year old male with history of reflux, high cholesterol, coronary artery disease who presents the ED with shortness of breath. Patient with hypertension but otherwise unremarkable vitals. [facility] that [facility] Patient had a CT scan several weeks ago but just found out the result yesterday. He was having some right-sided flank pain, shortness of breath and they got a CT scan to evaluate for kidney stone. Patient has had progressive shortness of breath and right-sided pain worse with inspiration. He has developed chills, cough, production. Unsure if he had any fever. Chest x-ray was done prior to my evaluation that shows right-sided pleural effusion with likely underlying infiltrate. He has diminished breath sounds on the right side. No signs of volume overload on exam. No chest pain. Will evaluate for infectious process with lab work, CT chest. Lower concern for PE. Lower concern that this is heart failure related. We will see if I can get a thoracentesis done at radiology today to evaluate fluid. He is a former smoker. Recently had a skin cancer removed from his face but no history of cancer otherwise. Unsure if this is a malignant process. Lab work to be collected. Will reevaluate after work-up.  Patient with no significant leukocytosis, anemia, electrolyte abnormality.  BNP normal.  Liver enzymes normal.  Lipase normal.  Doubt pancreatitis.  Doubt liver failure.  Less likely infectious process.  However CT scan of the chest does show a loculated pleural effusion.  He does have pulmonary nodules.  Concern for possibly cancerous process.  Possibly infectious process.  Talked with pulmonary critical care who believes patient would likely benefit from a chest tube  will have patient admitted to medicine as he is hemodynamically stable otherwise.  Covid test has been ordered.  Patient to be admitted to medicine for further care.  This chart was dictated using voice recognition software.  Despite best efforts to proofread,  errors can occur which can change the documentation meaning.    Final Clinical Impression(s) / ED Diagnoses Final diagnoses:  SOB (shortness of breath)  Loculated pleural effusion    Rx / DC Orders ED Discharge Orders         Ordered    IR THORACENTESIS ASP PLEURAL SPACE W/IMG GUIDE  Status:  Canceled     12/13/19 [place], [place], DO 12/13/19 1654

## 2019-12-13 NOTE — Procedures (Addendum)
Chest Tube Insertion Procedure Note Providers: Erskine Emery MD with Merlene Laughter NP Indications:  Clinically significant Effusion  Pre-operative Diagnosis: Effusion  Post-operative Diagnosis: Effusion  Procedure Details  Informed consent was obtained for the procedure, including sedation.  Risks of lung perforation, hemorrhage, arrhythmia, and adverse drug reaction were discussed.   [place] used to identify fluid pocket and overlying skin marked.  After sterile skin prep, using standard technique, a 14 French pigtail tube was placed in the right lateral 8th rib space.  Findings: 100 ml of cloudy fluid obtained  Estimated Blood Loss:  Minimal         Specimens:  Sent serosanguinous fluid              Complications:  None; patient tolerated the procedure well.

## 2019-12-13 NOTE — ED Notes (Signed)
Covid swab sent.

## 2019-12-13 NOTE — Telephone Encounter (Signed)
Called patient's daughter to discuss his appointment tomorrow. Was advised by [name] that they have admitted her dad to the hospital and have put a chest tube in. Appointment cancelled with Dr. Glori Bickers 12/14/19.

## 2019-12-13 NOTE — H&P (Addendum)
History and Physical    CADE DASHNER WNU:[id number] DOB: [birth year] DOA: 12/13/2019  PCP: Abner Greenspan, MD Consultants:  None Patient coming from: Home - lives alone; NOK: Daughter, Kathe Mariner, [phone]  Chief Complaint: SOB  HPI: Joel [name] is a 84 y.o. male with medical history significant of CAD; HTN; and HLD presenting with SOB.  He has had difficulty for 3-4 months.  [facility] because he was having flank pain - from his R hip up to his axilla.  He called yesterday and they told him to come to [facility].  He had a skin cancer on his face recently too.  It was removed at [facility].  There were "confusions and miscommunications all around" that led him to being told last night he should come to [facility].  No weight loss.  Occasional dysphagia with solids.  75 pack-year smoking history, remote.   ED Course:   Former smoker, R flank pain for >1 month.  [facility] was managing symptoms, removed skin cancer from his nose (?metastatic potential despite not melanoma).  CT several weeks ago, results yesterday reported with pleural effusion and inability to take care of that.  Effusion doesn't look like heart/liver failure or infection; loculated, moderately large and into fissure with compressive atelectasis.  +nodules.  ?malignancy.  COVID pending.  PCCM to place chest tube and admit to [place].  Review of Systems: As per HPI; otherwise review of systems reviewed and negative.   Ambulatory Status:  Ambulates without assistance  COVID Vaccine Status:  None; First shot; Complete  Past Medical History:  Diagnosis Date  . Arthritis   . Bifascicular block   . Coronary artery disease    Post CABG in 2001 with patent grafts in 2010  (90% mid LAD stenosis with competing flow from LIMA distally, 90% OM stenosis with competing flow from SVG,  90% RCA stenosis with competing flow from SVG, occluded SVG to Posterolateral branch).  . Gastritis   . GERD  (gastroesophageal reflux disease)   . History of nephrolithiasis   . Hyperlipidemia   . Hypertension   . Partial tear of rotator cuff(726.13)    Right  . Psoriasis     Past Surgical History:  Procedure Laterality Date  . BACK SURGERY    . CARDIAC CATHETERIZATION  01/2000   Multi vessel PTCA  . CARDIAC CATHETERIZATION  11/2008   multi vesstel dz, re occlusion small vessel  . CORONARY ARTERY BYPASS GRAFT  2001  . CT ABD W & PELVIS WO CM  09/2001   Kidney stones  . CT ABD WO/W & PELVIS WO CM  10/14/2007   Low grade obstruction on R by 21mm prox ereteral calculus, Sm bilat renal calculi sm HH  . Diffuision of unstable angina  03/2000  . ESOPHAGOGASTRODUODENOSCOPY  08/2008   Mild gastritis and GERD  . INGUINAL HERNIA REPAIR Right 09/10/2015   Procedure: RIGHT INGUINAL HERNIA REPAIR WITH MESH;  Surgeon: Coralie Keens, MD;  Location: [place];  Service: General;  Laterality: Right;  . INSERTION OF MESH Right 09/10/2015   Procedure: INSERTION OF MESH;  Surgeon: Coralie Keens, MD;  Location: [place];  Service: General;  Laterality: Right;  . KIDNEY STONE SURGERY  10/2007   Removal R ureteral kidney stone  . NASAL SINUS SURGERY  04/2002  . NECK SURGERY    . Pelvic CT  10/14/2007   nml  . PTCA  01/2000   Subendocardial MI  .  Stress cardiolite  10/2003   No acute changes  . TONSILLECTOMY      Social History   Socioeconomic History  . Marital status: Widowed    Spouse name: Not on file  . Number of children: Not on file  . Years of education: Not on file  . Highest education level: Not on file  Occupational History  . Occupation: Retired Dispensing optician work    Fish farm manager: RETIRED  . Occupation: Therapist, art x 4 years  Tobacco Use  . Smoking status: Former Smoker    Packs/day: 1.50    Years: 50.00    Pack years: 75.00    Quit date: 12/05/1999    Years since quitting: 20.0  . Smokeless tobacco: Never Used  Substance and Sexual Activity  . Alcohol use: No    Alcohol/week: 0.0 standard drinks   . Drug use: No  . Sexual activity: Not Currently  Other Topics Concern  . Not on file  Social History Narrative   1 half brother, 3 half sisters   Wife died last year of lung cancer   2 children   Fishes and walks dog for exercise      Field seismologist Release form and gives Kathe Mariner, daughter [phone]. Can leave msg on ans machine   Social Determinants of Health   Financial Resource Strain:   . Difficulty of Paying Living Expenses:   Food Insecurity:   . Worried About Charity fundraiser in the Last Year:   . Arboriculturist in the Last Year:   Transportation Needs:   . Film/video editor (Medical):   Marland Kitchen Lack of Transportation (Non-Medical):   Physical Activity:   . Days of Exercise per Week:   . Minutes of Exercise per Session:   Stress:   . Feeling of Stress :   Social Connections:   . Frequency of Communication with Friends and Family:   . Frequency of Social Gatherings with Friends and Family:   . Attends Religious Services:   . Active Member of Clubs or Organizations:   . Attends Archivist Meetings:   Marland Kitchen Marital Status:   Intimate Partner Violence:   . Fear of Current or Ex-Partner:   . Emotionally Abused:   Marland Kitchen Physically Abused:   . Sexually Abused:     No Known Allergies  Family History  Problem Relation Age of Onset  . Transient ischemic attack Mother   . Coronary artery disease Mother   . Heart disease Mother        CAD  . Alcohol abuse Father   . Diabetes type II Father   . Diabetes Father   . Heart disease Father        pacemaker    Prior to Admission medications   Medication Sig Start Date End Date Taking? Authorizing Provider  aspirin EC 81 MG tablet Take 1 tablet (81 mg total) by mouth daily. 11/24/16  Yes Tower, Wynelle Fanny, MD  Dextromethorphan-guaiFENesin (ROBITUSSIN COUGH+CHEST CONG DM) 20-200 MG/20ML LIQD Take 20 mLs by mouth at bedtime as needed (coughing).   Yes [provider]  hydrochlorothiazide  (HYDRODIURIL) 50 MG tablet Take 1 tablet (50 mg total) by mouth daily. 12/06/17  Yes Tower, Marne A, MD  latanoprost (XALATAN) 0.005 % ophthalmic solution Place 1 drop into both eyes at bedtime.   Yes [provider]  Multiple Vitamin (MULTIVITAMIN) tablet Take 1 tablet by mouth daily.     Yes [provider]  Omega-3 Fatty Acids (  FISH OIL) 1000 MG CAPS Take 1 capsule by mouth daily.   Yes [provider]  omeprazole (PRILOSEC) 20 MG capsule Take 1 capsule (20 mg total) by mouth daily as needed. For acid reflux Patient taking differently: Take 20 mg by mouth daily. For acid reflux 12/06/17  Yes Tower, Wynelle Fanny, MD    Physical Exam: Vitals:   12/13/19 1130 12/13/19 1300 12/13/19 1430 12/13/19 1602  BP: (!) 179/96 (!) 147/84 (!) 147/81 (!) 168/85  Pulse: 74 66 67 66  Resp:    16  Temp:    98.3 F (36.8 C)  TempSrc:    Oral  SpO2: 94% 95% 93% 95%     . General:  Appears calm and comfortable and is NAD, on room air . Eyes:  PERRL, EOMI, normal lids, iris . ENT:  Hard of hearing, grossly normal lips & tongue, mmm; edentulous . Neck:  no LAD, masses or thyromegaly . Cardiovascular:  RRR, no m/r/g. No LE edema.  Marland Kitchen Respiratory:   Limited breath sounds for entire bottom half of right lung.  Normal respiratory effort. . Abdomen:  soft, NT, ND, NABS . Back:   normal alignment, no CVAT . Skin:  no rash or induration seen on limited exam . Musculoskeletal:  grossly normal tone BUE/BLE, good ROM, no bony abnormality . Psychiatric:  grossly normal mood and affect, speech fluent and appropriate, AOx3 . Neurologic:  CN 2-12 grossly intact, moves all extremities in coordinated fashion    Radiological Exams on Admission: DG Chest 2 View  Result Date: 12/13/2019 CLINICAL DATA:  Shortness of breath and right-sided chest pain, initial encounter EXAM: CHEST - 2 VIEW COMPARISON:  10/24/2013 FINDINGS: Cardiac shadow is stable. Postsurgical changes are again noted. Left lung is  clear. Right-sided effusion with likely underlying atelectasis is noted. No rib abnormality is noted. Degenerative changes of the thoracic spine are seen. IMPRESSION: Right-sided pleural effusion with likely underlying atelectasis/infiltrate Electronically Signed   By: Inez Catalina M.D.   On: 12/13/2019 11:04    EKG: Independently reviewed.  NSR with rate 81; bifascicular block with no evidence of acute ischemia   Labs on Admission: I have personally reviewed the available labs and imaging studies at the time of the admission.  Pertinent labs:   K+ 3.4 Glucose 112 Albumin 3.2 BNP 89.3 LDH 113 HS troponin 8 Lactate 1.3 Normal CBC COVID pending   Assessment/Plan Principal Problem:   Loculated pleural effusion Active Problems:   Hyperlipidemia   Coronary atherosclerosis   HTN (hypertension)   Pleural effusion -Patient with R flank and chest pain present for several months -No O2 requirement, hemodynamically stable -Outpatient imaging reflective of moderate R-sided pleural effusion and so he was encouraged to come to the ER for evaluation -CTA today with moderate partially loculated effusion with fluid tracking into the minor fissure and adjacent compressive atelectasis -Also with pulm nodules and enlarged R hilar LN -Pulmonology was consulted and has recommended TRH admission and chest tube placement; this is being done now -If the effusion drains well, it is possible that the chest tube can be removed tomorrow and the patient discharged -There is also concern for underlying malignancy and so he may need further evaluation and treatment, including bronchoscopy -Further management as per PCCM -Will monitor overnight in progressive care  CAD -Continue ASA  HTN -Continue HCTZ but decrease dose to 25 mg; there is little utility from 50 mg and increased side effect profile  HLD -Hold fish oil due to limited  inpatient utility  Glaucoma -Continue Xalatan  Recent skin  cancer -Uncertain pathology although they were told it "is not melanoma" -Unlikely related based on current information   Note: This patient has been tested and is pending for the novel coronavirus COVID-19.  DVT prophylaxis:  Lovenox Code Status:  Full - confirmed with patient/family Family Communication: Daughter was present throughout evaluation. Disposition Plan:  The patient is from: home  Anticipated d/c is to: home without [facility] services   Anticipated d/c date will depend on clinical response to treatment, but possibly as early as tomorrow if he has excellent response to treatment  Patient is currently: acutely ill Consults called: PCCM Admission status:  It is my clinical opinion that referral for OBSERVATION is reasonable and necessary in this patient based on the above information provided. The aforementioned taken together are felt to place the patient at high risk for further clinical deterioration. However it is anticipated that the patient may be medically stable for discharge from the hospital within 24 to 48 hours.    Karmen Bongo MD [facility]   How to contact the [facility] Attending or Consulting provider [place] or covering provider during after hours [place], for this patient?  1. Check the care team in [facility] and look for a) attending/consulting TR[facility] provider listed and b) the [facility] team listed 2. Log into [email or web address] and use [place]'s universal password to access. If you do not have the password, please contact the hospital operator. 3. Locate the [facility] provider you are looking for under [facility] and page to a number that you can be directly reached. 4. If you still have difficulty reaching the provider, please page the [facility] (Director on Call) for the Hospitalists listed on amion for assistance.   12/13/2019, 5:11 PM

## 2019-12-13 NOTE — Consult Note (Signed)
NAME:  Joel [name], MRN:  [id number], DOB:  1936/01/09, LOS: 0 ADMISSION DATE:  12/13/2019, CONSULTATION DATE: June 9 REFERRING MD: Dr. Lorin Mercy, CHIEF COMPLAINT: Flank pain  Brief History   84 year old male, [facility] was told to come to the emergency room because of a loculated pleural effusion seen on the CT scan of the chest.  History of present illness   This is a pleasant 84 year old male who is followed by the [place] clinic who has experienced 3 months of right-sided flank pain.  He went to the [place] to be evaluated and initially had some imaging studies and was found to have evidence of a skin cancer on the right side of his face.  He was referred to plastic surgery and this was removed but the flank pain persisted he returned to his clinic at the [place].  There he says that he was referred for further imaging and a CT scan was ultimately performed which showed a fluid collection in his right chest.  He was advised by his clinic physicians to come to [facility] for further evaluation.  Here in the emergency room today he had a CT scan of his chest performed which shows a moderate to large right-sided pleural effusion which is loculated and an airway abnormality.  He denies significant shortness of breath though he has had some fatigue.  No recent weight loss.  He says that he has been wheezing some lately.  He says that he chokes on food fairly routinely and brings up mucus.  Approximately 10 days ago he had fevers and chills but he decided not to come get evaluated because it was a holiday weekend.  Past Medical History  GERD Coronary artery disease Hyperlipidemia Hypertension Nephrolithiasis  Significant Hospital Events     Consults:  Pulmonary  Procedures:  6/9 right-sided pigtail pleural drainage catheter >   Significant Diagnostic Tests:  June 9 CT angiogram chest images independently reviewed showing a moderate multi lobular loculated pleural effusion with  some compressive atelectasis, no clear mass or infiltrate.  Micro Data:  6/9 Right pleural fluid>   Antimicrobials:     Interim history/subjective:    Objective   Blood pressure (!) 168/85, pulse 66, temperature 98.3 F (36.8 C), temperature source Oral, resp. rate 16, SpO2 95 %.        Intake/Output Summary (Last 24 hours) at 12/13/2019 1724 Last data filed at 12/13/2019 1603 Gross per 24 hour  Intake --  Output 400 ml  Net -400 ml   There were no vitals filed for this visit.  Examination:  General:  Chronically ill appearing, resting comfortably in bed HENT: NCAT OP clear, surgical scar R cheek PULM: diminished R base, wheezing bilaterally, normal effort CV: RRR, no mgr GI: BS+, soft, nontender MSK: normal bulk and tone Neuro: awake, alert, no distress, MAEW   Resolved Hospital Problem list     Assessment & Plan:  Moderate to large sized loculated pleural effusion in right lung of uncertain etiology: Given his history of recurrent aspiration suspect this is likely an empyema but given his smoking history malignancy is also possible. Place chest tube Send for culture, glucose, pH, cytology, LDH, protein Pigtail drainage catheter management per protocol with saline flushes Chest x-ray in a.m. Would hold off on antibiotics for now as not septic and cause of effusion uncertain  Likely recurrent aspiration: SLP evalution  Admit to hospitalists  Best practice:   per [facility]  Labs  CBC: Recent Labs  Lab 12/13/19 1130  WBC 9.0  NEUTROABS 5.7  HGB 15.6  HCT 48.2  MCV 94.7  PLT 845    Basic Metabolic Panel: Recent Labs  Lab 12/13/19 1130  NA 139  K 3.4*  CL 106  CO2 23  GLUCOSE 112*  BUN 13  CREATININE 0.85  CALCIUM 8.5*   GFR: CrCl cannot be calculated (Unknown ideal weight.). Recent Labs  Lab 12/13/19 1130 12/13/19 1201  WBC 9.0  --   LATICACIDVEN  --  1.3    Liver Function Tests: Recent Labs  Lab 12/13/19 1130  AST 23  ALT 22   ALKPHOS 84  BILITOT 0.8  PROT 6.2*  ALBUMIN 3.2*   Recent Labs  Lab 12/13/19 1130  LIPASE 22   No results for input(s): AMMONIA in the last 168 hours.  ABG    Component Value Date/Time   TCO2 30 10/14/2007 2105     Coagulation Profile: No results for input(s): INR, PROTIME in the last 168 hours.  Cardiac Enzymes: No results for input(s): CKTOTAL, CKMB, CKMBINDEX, TROPONINI in the last 168 hours.  HbA1C: Hgb A1c MFr Bld  Date/Time Value Ref Range Status  03/08/2019 10:41 AM 6.3 4.6 - 6.5 % Final    Comment:    Glycemic Control Guidelines for People with Diabetes:Non Diabetic:  <6%Goal of Therapy: <7%Additional Action Suggested:  >8%   11/22/2017 09:26 AM 6.1 4.6 - 6.5 % Final    Comment:    Glycemic Control Guidelines for People with Diabetes:Non Diabetic:  <6%Goal of Therapy: <7%Additional Action Suggested:  >8%     CBG: No results for input(s): GLUCAP in the last 168 hours.  Review of Systems:   Gen: per HPI HEENT: Denies blurred vision, double vision, hearing loss, tinnitus, sinus congestion, rhinorrhea, sore throat, neck stiffness, dysphagia PULM: per HPI CV: Denies chest pain, edema, orthopnea, paroxysmal nocturnal dyspnea, palpitations GI: Denies abdominal pain, nausea, vomiting, diarrhea, hematochezia, melena, constipation, change in bowel habits GU: Denies dysuria, hematuria, polyuria, oliguria, urethral discharge Endocrine: Denies hot or cold intolerance, polyuria, polyphagia or appetite change Derm: Denies rash, dry skin, scaling or peeling skin change Heme: Denies easy bruising, bleeding, bleeding gums Neuro: Denies headache, numbness, weakness, slurred speech, loss of memory or consciousness   Past Medical History  He,  has a past medical history of Arthritis, Bifascicular block, Coronary artery disease, Gastritis, GERD (gastroesophageal reflux disease), History of nephrolithiasis, Hyperlipidemia, Hypertension, Partial tear of rotator cuff(726.13), and  Psoriasis.   Surgical History    Past Surgical History:  Procedure Laterality Date  . BACK SURGERY    . CARDIAC CATHETERIZATION  01/2000   Multi vessel PTCA  . CARDIAC CATHETERIZATION  11/2008   multi vesstel dz, re occlusion small vessel  . CORONARY ARTERY BYPASS GRAFT  2001  . CT ABD W & PELVIS WO CM  09/2001   Kidney stones  . CT ABD WO/W & PELVIS WO CM  10/14/2007   Low grade obstruction on R by 12mm prox ereteral calculus, Sm bilat renal calculi sm HH  . Diffuision of unstable angina  03/2000  . ESOPHAGOGASTRODUODENOSCOPY  08/2008   Mild gastritis and GERD  . INGUINAL HERNIA REPAIR Right 09/10/2015   Procedure: RIGHT INGUINAL HERNIA REPAIR WITH MESH;  Surgeon: Coralie Keens, MD;  Location: [place];  Service: General;  Laterality: Right;  . INSERTION OF MESH Right 09/10/2015   Procedure: INSERTION OF MESH;  Surgeon: Coralie Keens, MD;  Location: [place];  Service: General;  Laterality: Right;  . KIDNEY STONE SURGERY  10/2007   Removal R ureteral kidney stone  . NASAL SINUS SURGERY  04/2002  . NECK SURGERY    . Pelvic CT  10/14/2007   nml  . PTCA  01/2000   Subendocardial MI  . Stress cardiolite  10/2003   No acute changes  . TONSILLECTOMY       Social History   reports that he quit smoking about 20 years ago. He has a 75.00 pack-year smoking history. He has never used smokeless tobacco. He reports that he does not drink alcohol or use drugs.   Family History   His family history includes Alcohol abuse in his father; Coronary artery disease in his mother; Diabetes in his father; Diabetes type II in his father; Heart disease in his father and mother; Transient ischemic attack in his mother.   Allergies No Known Allergies   Home Medications  Prior to Admission medications   Medication Sig Start Date End Date Taking? Authorizing Provider  aspirin EC 81 MG tablet Take 1 tablet (81 mg total) by mouth daily. 11/24/16  Yes Tower, Wynelle Fanny, MD  Dextromethorphan-guaiFENesin  (ROBITUSSIN COUGH+CHEST CONG DM) 20-200 MG/20ML LIQD Take 20 mLs by mouth at bedtime as needed (coughing).   Yes [provider]  hydrochlorothiazide (HYDRODIURIL) 50 MG tablet Take 1 tablet (50 mg total) by mouth daily. 12/06/17  Yes Tower, Marne A, MD  latanoprost (XALATAN) 0.005 % ophthalmic solution Place 1 drop into both eyes at bedtime.   Yes [provider]  Multiple Vitamin (MULTIVITAMIN) tablet Take 1 tablet by mouth daily.     Yes [provider]  Omega-3 Fatty Acids (FISH OIL) 1000 MG CAPS Take 1 capsule by mouth daily.   Yes [provider]  omeprazole (PRILOSEC) 20 MG capsule Take 1 capsule (20 mg total) by mouth daily as needed. For acid reflux Patient taking differently: Take 20 mg by mouth daily. For acid reflux 12/06/17  Yes Tower, Wynelle Fanny, MD     Critical care time: n/a      Roselie Awkward, MD [place] PCCM Pager: [phone] Cell: [phone] If no response, call [phone]

## 2019-12-13 NOTE — ED Triage Notes (Signed)
Pt developed R sided rib pain four months ago, has not resolved since then. Multiple injuries to same area ever since childhood. Also endorses shortness of breath, pain with inspiration. Pt sent here by PCP.

## 2019-12-13 NOTE — Telephone Encounter (Signed)
Aware, thanks for letting me know  

## 2019-12-14 ENCOUNTER — Inpatient Hospital Stay (HOSPITAL_COMMUNITY): Payer: Medicare Other

## 2019-12-14 ENCOUNTER — Inpatient Hospital Stay: Payer: Medicare Other | Admitting: Family Medicine

## 2019-12-14 ENCOUNTER — Observation Stay (HOSPITAL_COMMUNITY): Payer: Medicare Other

## 2019-12-14 DIAGNOSIS — Z87442 Personal history of urinary calculi: Secondary | ICD-10-CM | POA: Diagnosis not present

## 2019-12-14 DIAGNOSIS — R1312 Dysphagia, oropharyngeal phase: Secondary | ICD-10-CM | POA: Diagnosis present

## 2019-12-14 DIAGNOSIS — I10 Essential (primary) hypertension: Secondary | ICD-10-CM | POA: Diagnosis not present

## 2019-12-14 DIAGNOSIS — J90 Pleural effusion, not elsewhere classified: Secondary | ICD-10-CM | POA: Diagnosis not present

## 2019-12-14 DIAGNOSIS — Z4682 Encounter for fitting and adjustment of non-vascular catheter: Secondary | ICD-10-CM | POA: Diagnosis not present

## 2019-12-14 DIAGNOSIS — I517 Cardiomegaly: Secondary | ICD-10-CM | POA: Diagnosis not present

## 2019-12-14 DIAGNOSIS — H409 Unspecified glaucoma: Secondary | ICD-10-CM | POA: Diagnosis present

## 2019-12-14 DIAGNOSIS — R5381 Other malaise: Secondary | ICD-10-CM | POA: Diagnosis not present

## 2019-12-14 DIAGNOSIS — R0602 Shortness of breath: Secondary | ICD-10-CM | POA: Diagnosis present

## 2019-12-14 DIAGNOSIS — Z85828 Personal history of other malignant neoplasm of skin: Secondary | ICD-10-CM | POA: Diagnosis not present

## 2019-12-14 DIAGNOSIS — E785 Hyperlipidemia, unspecified: Secondary | ICD-10-CM | POA: Diagnosis present

## 2019-12-14 DIAGNOSIS — J690 Pneumonitis due to inhalation of food and vomit: Secondary | ICD-10-CM | POA: Diagnosis not present

## 2019-12-14 DIAGNOSIS — I2510 Atherosclerotic heart disease of native coronary artery without angina pectoris: Secondary | ICD-10-CM | POA: Diagnosis present

## 2019-12-14 DIAGNOSIS — Z87891 Personal history of nicotine dependence: Secondary | ICD-10-CM | POA: Diagnosis not present

## 2019-12-14 DIAGNOSIS — H9190 Unspecified hearing loss, unspecified ear: Secondary | ICD-10-CM | POA: Diagnosis present

## 2019-12-14 DIAGNOSIS — J9811 Atelectasis: Secondary | ICD-10-CM | POA: Diagnosis not present

## 2019-12-14 DIAGNOSIS — Z79899 Other long term (current) drug therapy: Secondary | ICD-10-CM | POA: Diagnosis not present

## 2019-12-14 DIAGNOSIS — J918 Pleural effusion in other conditions classified elsewhere: Secondary | ICD-10-CM | POA: Diagnosis present

## 2019-12-14 DIAGNOSIS — J181 Lobar pneumonia, unspecified organism: Secondary | ICD-10-CM | POA: Diagnosis not present

## 2019-12-14 DIAGNOSIS — L409 Psoriasis, unspecified: Secondary | ICD-10-CM | POA: Diagnosis present

## 2019-12-14 DIAGNOSIS — J310 Chronic rhinitis: Secondary | ICD-10-CM | POA: Diagnosis present

## 2019-12-14 DIAGNOSIS — R079 Chest pain, unspecified: Secondary | ICD-10-CM | POA: Diagnosis not present

## 2019-12-14 DIAGNOSIS — Z8249 Family history of ischemic heart disease and other diseases of the circulatory system: Secondary | ICD-10-CM | POA: Diagnosis not present

## 2019-12-14 DIAGNOSIS — Z7982 Long term (current) use of aspirin: Secondary | ICD-10-CM | POA: Diagnosis not present

## 2019-12-14 DIAGNOSIS — I119 Hypertensive heart disease without heart failure: Secondary | ICD-10-CM | POA: Diagnosis present

## 2019-12-14 DIAGNOSIS — Z20822 Contact with and (suspected) exposure to covid-19: Secondary | ICD-10-CM | POA: Diagnosis present

## 2019-12-14 DIAGNOSIS — Z951 Presence of aortocoronary bypass graft: Secondary | ICD-10-CM | POA: Diagnosis not present

## 2019-12-14 DIAGNOSIS — K219 Gastro-esophageal reflux disease without esophagitis: Secondary | ICD-10-CM | POA: Diagnosis not present

## 2019-12-14 LAB — BASIC METABOLIC PANEL
Anion gap: 9 (ref 5–15)
BUN: 13 mg/dL (ref 8–23)
CO2: 29 mmol/L (ref 22–32)
Calcium: 8.9 mg/dL (ref 8.9–10.3)
Chloride: 100 mmol/L (ref 98–111)
Creatinine, Ser: 0.88 mg/dL (ref 0.61–1.24)
GFR calc Af Amer: >60 mL/min (ref >60)
GFR calc non Af Amer: >60 mL/min (ref >60)
Glucose, Bld: 139 mg/dL — ABNORMAL HIGH (ref 70–99)
Potassium: 3.6 mmol/L (ref 3.5–5.1)
Sodium: 138 mmol/L (ref 135–145)

## 2019-12-14 LAB — CBC WITH DIFFERENTIAL/PLATELET
Abs Immature Granulocytes: 0.03 10*3/uL (ref 0.00–0.07)
Basophils Absolute: 0.1 10*3/uL (ref 0.0–0.1)
Basophils Relative: 1 %
Eosinophils Absolute: 0.6 10*3/uL — ABNORMAL HIGH (ref 0.0–0.5)
Eosinophils Relative: 5 %
HCT: 46.4 % (ref 39.0–52.0)
Hemoglobin: 15.5 g/dL (ref 13.0–17.0)
Immature Granulocytes: 0 %
Lymphocytes Relative: 15 %
Lymphs Abs: 1.9 10*3/uL (ref 0.7–4.0)
MCH: 30.9 pg (ref 26.0–34.0)
MCHC: 33.4 g/dL (ref 30.0–36.0)
MCV: 92.4 fL (ref 80.0–100.0)
Monocytes Absolute: 1.2 10*3/uL — ABNORMAL HIGH (ref 0.1–1.0)
Monocytes Relative: 9 %
Neutro Abs: 9.2 10*3/uL — ABNORMAL HIGH (ref 1.7–7.7)
Neutrophils Relative %: 70 %
Platelets: 336 10*3/uL (ref 150–400)
RBC: 5.02 MIL/uL (ref 4.22–5.81)
RDW: 11.9 % (ref 11.5–15.5)
WBC: 13 10*3/uL — ABNORMAL HIGH (ref 4.0–10.5)
nRBC: 0 % (ref 0.0–0.2)

## 2019-12-14 LAB — GRAM STAIN

## 2019-12-14 MED ORDER — SODIUM CHLORIDE 0.9 % IV SOLN
3.0000 g | Freq: Four times a day (QID) | INTRAVENOUS | Status: DC
  Administered 2019-12-14 – 2019-12-15 (×4): 3 g via INTRAVENOUS
  Filled 2019-12-14 (×6): qty 8

## 2019-12-14 MED ORDER — TRAMADOL HCL 50 MG PO TABS
50.0000 mg | ORAL_TABLET | Freq: Three times a day (TID) | ORAL | Status: DC | PRN
  Administered 2019-12-14: 50 mg via ORAL
  Filled 2019-12-14: qty 1

## 2019-12-14 MED ORDER — SENNOSIDES-DOCUSATE SODIUM 8.6-50 MG PO TABS: 1.0000 | ORAL_TABLET | Freq: Two times a day (BID) | ORAL | Status: DC | PRN

## 2019-12-14 MED ORDER — ACETAMINOPHEN 500 MG PO TABS
1000.0000 mg | ORAL_TABLET | Freq: Three times a day (TID) | ORAL | Status: DC
  Administered 2019-12-14 – 2019-12-15 (×3): 1000 mg via ORAL
  Filled 2019-12-14 (×3): qty 2

## 2019-12-14 MED ORDER — FENTANYL CITRATE (PF) 100 MCG/2ML IJ SOLN: 25.0000 ug | INTRAMUSCULAR | Status: DC | PRN

## 2019-12-14 MED ORDER — OXYCODONE HCL 5 MG PO TABS: 5.0000 mg | ORAL_TABLET | Freq: Four times a day (QID) | ORAL | Status: DC | PRN

## 2019-12-14 MED ORDER — POLYETHYLENE GLYCOL 3350 17 G PO PACK: 17.0000 g | PACK | Freq: Two times a day (BID) | ORAL | Status: DC | PRN

## 2019-12-14 NOTE — Progress Notes (Signed)
Dr. Cyndia Skeeters notified of gram + cocci in aerobic culture of pleural fluid. No new orders at this time.

## 2019-12-14 NOTE — Progress Notes (Signed)
Patient taken for chest CT via bed.

## 2019-12-14 NOTE — Progress Notes (Signed)
Patient returned from CT and swallow eval at 1350hrs, Unasyn IV started per order.

## 2019-12-14 NOTE — Progress Notes (Signed)
PROGRESS NOTE  Joel [name] XLK:[id number] DOB: [birth year]   PCP: Abner Greenspan, MD  Patient is from: Home.  DOA: 12/13/2019 LOS: 0  Brief Narrative / Interim history: 84 year old male with history of CAD, nephrolithiasis, HTN, HLD, GERD and former smoker (75-pack-year history) presenting with right flank pain for over a month.  He had CT at [facility] several weeks ago that has resulted in moderately large right-sided pleural effusion and he was directed to come to ED.  Of note, patient reports fever and chills about a month ago for which she was not evaluated.  Also occasional dysphagia with solids.  In ED, CTA with moderate partially loculated effusion with fluid tracking into the minor fissure and adjacent compressive atelectasis.  PCCM consulted and placed chest tube.  Pleural fluid sent for study.  Pleural fluid exudative by light criteria.  Pleural fluid culture pending.  Blood cultures NGTD.  Subjective: Seen and examined earlier this morning.  Reports significant pain in his right chest radiating to his right shoulder last night.  Pain is worse with deep breathing and movement.  He is very reluctant about taking pain medication.  Denies dyspnea, nausea, vomiting, abdominal pain or UTI symptoms.  Objective: Vitals:   12/13/19 2049 12/14/19 0045 12/14/19 0603 12/14/19 0816  BP: (!) 154/90 (!) 148/88 (!) 151/90 (!) 143/86  Pulse: 79 82 92 94  Resp: 20 20 16 18   Temp: 98.2 F (36.8 C) 98.2 F (36.8 C) 99 F (37.2 C) 98.1 F (36.7 C)  TempSrc: Oral Oral Oral Oral  SpO2: 96% 95% 92% 94%  Weight: 83 kg  81.5 kg   Height: 5\' 10"  (1.778 m)       Intake/Output Summary (Last 24 hours) at 12/14/2019 1227 Last data filed at 12/14/2019 1100 Gross per 24 hour  Intake 250 ml  Output 2940 ml  Net -2690 ml   Filed Weights   12/13/19 2049 12/14/19 0603  Weight: 83 kg 81.5 kg    Examination:  GENERAL: No apparent distress.  Nontoxic. HEENT: MMM.  Vision and hearing grossly intact.   NECK: Supple.  No apparent JVD.  RESP: On 2 L by [place].  No IWOB.  Fair aeration.  Chest tube to right chest with serosanguineous fluid. CVS:  RRR. Heart sounds normal.  ABD/GI/GU: BS+. Abd soft, NTND.  MSK/EXT:  Moves extremities. No apparent deformity. No edema.  SKIN: no apparent skin lesion or wound NEURO: Awake, alert and oriented appropriately.  No apparent focal neuro deficit. PSYCH: Calm. Normal affect.  Procedures:  6/9-right chest tube placement with removal of 1400 cc serosanguineous fluid  Microbiology summarized: COVID-19 PCR negative. Blood cultures NGTD. Pleural fluid cultures NGTD.  Assessment & Plan: Moderate to large loculated right lung pleural effusion-pleural fluid exudative by light's criteria. Exudative fluid with mild temp, leukocytosis and loculation concerning for infectious process. Cannot rule out malignancy given his heavy smoking history.  -PCCM following. -Start IV Unasyn -Follow further pleural fluid studies -Serial chest x-ray -Pain control with scheduled Tylenol, as needed tramadol and fentanyl -Encourage incentive spirometry  Dysphagia? -Follow SLP eval  History of CAD: No anginal symptoms. -Continue home meds  Essential hypertension: Normotensive -Continue home meds  GERD -Continue Protonix             DVT prophylaxis: Subcu Lovenox Code Status: Full code Family Communication: Updated patient's daughter at bedside.  Status is: Inpatient  Remains inpatient appropriate because:IV treatments appropriate due to intensity of illness or inability to take PO and Inpatient  level of care appropriate due to severity of illness   Dispo: The patient is from: Home              Anticipated d/c is to: Home              Anticipated d/c date is: 2 days              Patient currently is not medically stable to d/c.          Consultants:  PCCM   Sch Meds:  Scheduled Meds: . acetaminophen  1,000 mg Oral Q8H  . aspirin EC  81 mg  Oral Daily  . enoxaparin (LOVENOX) injection  40 mg Subcutaneous Q24H  . hydrochlorothiazide  25 mg Oral Daily  . latanoprost  1 drop Both Eyes QHS  . pantoprazole  40 mg Oral Daily  . sodium chloride flush  10 mL Intracatheter Q8H   Continuous Infusions: . ampicillin-sulbactam (UNASYN) IV     PRN Meds:.fentaNYL (SUBLIMAZE) injection, polyethylene glycol, senna-docusate, traMADol  Antimicrobials: Anti-infectives (From admission, onward)   Start     Dose/Rate Route Frequency Ordered Stop   12/14/19 1230  Ampicillin-Sulbactam (UNASYN) 3 g in sodium chloride 0.9 % 100 mL IVPB     Discontinue     3 g 200 mL/hr over 30 Minutes Intravenous Every 6 hours 12/14/19 1224         I have personally reviewed the following labs and images: CBC: Recent Labs  Lab 12/13/19 1130 12/14/19 0347  WBC 9.0 13.0*  NEUTROABS 5.7 9.2*  HGB 15.6 15.5  HCT 48.2 46.4  MCV 94.7 92.4  PLT 348 336   BMP &GFR Recent Labs  Lab 12/13/19 1130 12/14/19 0347  NA 139 138  K 3.4* 3.6  CL 106 100  CO2 23 29  GLUCOSE 112* 139*  BUN 13 13  CREATININE 0.85 0.88  CALCIUM 8.5* 8.9   Estimated Creatinine Clearance: 68 mL/min (by C-G formula based on SCr of 0.88 mg/dL). Liver & Pancreas: Recent Labs  Lab 12/13/19 1130  AST 23  ALT 22  ALKPHOS 84  BILITOT 0.8  PROT 6.2*  ALBUMIN 3.2*   Recent Labs  Lab 12/13/19 1130  LIPASE 22   No results for input(s): AMMONIA in the last 168 hours. Diabetic: No results for input(s): HGBA1C in the last 72 hours. No results for input(s): GLUCAP in the last 168 hours. Cardiac Enzymes: No results for input(s): CKTOTAL, CKMB, CKMBINDEX, TROPONINI in the last 168 hours. No results for input(s): PROBNP in the last 8760 hours. Coagulation Profile: No results for input(s): INR, PROTIME in the last 168 hours. Thyroid Function Tests: No results for input(s): TSH, T4TOTAL, FREET4, T3FREE, THYROIDAB in the last 72 hours. Lipid Profile: No results for input(s):  CHOL, HDL, LDLCALC, TRIG, CHOLHDL, LDLDIRECT in the last 72 hours. Anemia Panel: No results for input(s): VITAMINB12, FOLATE, FERRITIN, TIBC, IRON, RETICCTPCT in the last 72 hours. Urine analysis:    Component Value Date/Time   COLORURINE YELLOW 10/14/2007 2058   APPEARANCEUR CLEAR 10/14/2007 2058   LABSPEC 1.020 10/14/2007 2058   PHURINE 5.5 10/14/2007 2058   GLUCOSEU NEGATIVE 10/14/2007 2058   HGBUR LARGE (A) 10/14/2007 2058   BILIRUBINUR NEGATIVE 10/14/2007 2058   [place] NEGATIVE 10/14/2007 2058   PROTEINUR NEGATIVE 10/14/2007 2058   UROBILINOGEN 0.2 10/14/2007 2058   NITRITE NEGATIVE 10/14/2007 2058   LEUKOCYTESUR NEGATIVE 10/14/2007 2058   Sepsis Labs: Invalid input(s): PROCALCITONIN, LACTICIDVEN  Microbiology: Recent Results (from the past  240 hour(s))  Blood culture (routine x 2)     Status: None (Preliminary result)   Collection Time: 12/13/19 11:38 AM   Specimen: BLOOD LEFT ARM  Result Value Ref Range Status   Specimen Description BLOOD LEFT ARM  Final   Special Requests   Final    BOTTLES DRAWN AEROBIC AND ANAEROBIC Blood Culture adequate volume   Culture   Final    NO GROWTH < 24 HOURS Performed at [facility], [place] [address], [place], [place] [address]    Report Status PENDING  Incomplete  Blood culture (routine x 2)     Status: None (Preliminary result)   Collection Time: 12/13/19 11:45 AM   Specimen: BLOOD  Result Value Ref Range Status   Specimen Description BLOOD LEFT ANTECUBITAL  Final   Special Requests   Final    BOTTLES DRAWN AEROBIC AND ANAEROBIC Blood Culture adequate volume   Culture   Final    NO GROWTH < 24 HOURS Performed at [facility], [place] [address] [place], [place] [address]    Report Status PENDING  Incomplete  SARS Coronavirus 2 by RT PCR (hospital order, performed in [facility] hospital lab) Nasopharyngeal Nasopharyngeal Swab     Status: None   Collection Time: 12/13/19  3:45 PM   Specimen: Nasopharyngeal Swab   Result Value Ref Range Status   SARS Coronavirus 2 NEGATIVE NEGATIVE Final    Comment: (NOTE) SARS-CoV-2 target nucleic acids are NOT DETECTED. The SARS-CoV-2 RNA is generally detectable in upper and lower respiratory specimens during the acute phase of infection. The lowest concentration of SARS-CoV-2 viral copies this assay can detect is 250 copies / mL. A negative result does not preclude SARS-CoV-2 infection and should not be used as the sole basis for treatment or other patient management decisions.  A negative result may occur with improper specimen collection / handling, submission of specimen other than nasopharyngeal swab, presence of viral mutation(s) within the areas targeted by this assay, and inadequate number of viral copies (<250 copies / mL). A negative result must be combined with clinical observations, patient history, and epidemiological information. Fact Sheet for Patients:   StrictlyIdeas.no Fact Sheet for Healthcare Providers: BankingDealers.co.za This test is not yet approved or cleared  by the [place] FDA and has been authorized for detection and/or diagnosis of SARS-CoV-2 by FDA under an Emergency Use Authorization (EUA).  This EUA will remain in effect (meaning this test can be used) for the duration of the COVID-19 declaration under Section 564(b)(1) of the Act, 21 U.S.C. section 360bbb-3(b)(1), unless the authorization is terminated or revoked sooner. Performed at [facility], [place] [address] [place], [place] [address]   Culture, body fluid-bottle     Status: None (Preliminary result)   Collection Time: 12/13/19  5:21 PM   Specimen: Fluid  Result Value Ref Range Status   Specimen Description FLUID PLEURAL  Final   Special Requests BOTTLES DRAWN AEROBIC AND ANAEROBIC  Final   Culture   Final    NO GROWTH < 12 HOURS Performed at [facility], [place] [address], [place],  [address]     Report Status PENDING  Incomplete  Gram stain     Status: None   Collection Time: 12/13/19  5:21 PM   Specimen: Fluid  Result Value Ref Range Status   Specimen Description FLUID PLEURAL  Final   Special Requests NONE  Final   Gram Stain   Final  MODERATE WBC PRESENT, PREDOMINANTLY PMN NO ORGANISMS SEEN Performed at [place] [address], [place], [place] [address]    Report Status 12/14/2019 FINAL  Final    Radiology Studies: CT Angio Chest PE W and/or Wo Contrast  Result Date: 12/13/2019 CLINICAL DATA:  Shortness of breath, pleural effusion, cough. EXAM: CT ANGIOGRAPHY CHEST WITH CONTRAST TECHNIQUE: Multidetector CT imaging of the chest was performed using the standard protocol during bolus administration of intravenous contrast. Multiplanar CT image reconstructions and MIPs were obtained to evaluate the vascular anatomy. CONTRAST:  5mL OMNIPAQUE IOHEXOL 350 MG/ML SOLN COMPARISON:  Radiograph earlier this day. FINDINGS: Cardiovascular: There are no filling defects within the pulmonary arteries to suggest pulmonary embolus. Aortic atherosclerosis and tortuosity. Cannot assess for dissection given contrast tailored to pulmonary artery evaluation. Post CABG with dense calcification of native coronary arteries. No pericardial effusion. Mediastinum/Nodes: 17 mm right hilar node, series 5, image 46. 12 mm right infrahilar node, series 5, image 56. Small mediastinal nodes not enlarged by size criteria. There is no left hilar adenopathy. No esophageal wall thickening. No thyroid nodule. Lungs/Pleura: Moderately large partially loculated right pleural effusion with fluid tracking into the minor fissure. No definite pleural nodularity or enhancement. Adjacent compressive atelectasis, including majority of the right lower lobe. Small right upper lobe pulmonary nodule measuring 5 mm, series 7, image 33. Punctate subpleural right upper lobe pulmonary nodule, series 7, image 46. There is also  a calcified granuloma in the right lower lobe. Trachea and mainstem bronchi are patent. There is bronchial occlusion/filling of basilar segmental bronchus on the right which may be mucous plugging but is nonspecific. There is no left pleural effusion. Tiny faint pulmonary nodule the left lung apex, series 7, image 17. Upper Abdomen: Homogeneous attenuation of the liver and spleen. No acute finding in the included upper abdomen. Musculoskeletal: No acute osseous abnormality or focal bone lesion. Median sternotomy. Review of the MIP images confirms the above findings. IMPRESSION: 1. No pulmonary embolus. 2. Moderately large partially loculated right pleural effusion with fluid tracking into the minor fissure. Adjacent compressive atelectasis, including majority of the right lower lobe. No definite pleural nodularity or enhancement. 3. Bronchial occlusion/filling of basilar segmental bronchus on the right which may be mucous plugging but is nonspecific. 4. Enlarged right hilar nodes which are nonspecific. 5. Small bilateral pulmonary nodules, largest measuring 5 mm in the right upper lobe. No dominant pulmonary mass. Given right hilar adenopathy in unilateral pleural effusion, consider pleural fluid sampling. Aortic Atherosclerosis (ICD10-I70.0). Electronically Signed   By: Keith Rake M.D.   On: 12/13/2019 15:43   DG Chest Port 1 View  Result Date: 12/14/2019 CLINICAL DATA:  Chest tube, right-sided chest pain and shortness of breath EXAM: PORTABLE CHEST 1 VIEW COMPARISON:  12/13/2019 FINDINGS: No significant interval change in AP portable chest radiograph with a right-sided pigtail chest tube remaining about the right lung base. Unchanged small right pleural effusion and associated atelectasis or consolidation. Probable trace left pleural effusion. Cardiomegaly status post median sternotomy IMPRESSION: No significant interval change in AP portable chest radiograph with a right-sided pigtail chest tube  remaining about the right lung base. Small right pleural effusion and associated atelectasis or consolidation. Electronically Signed   By: Eddie Candle M.D.   On: 12/14/2019 10:20   DG Chest Port 1 View  Result Date: 12/13/2019 CLINICAL DATA:  Chest tube in place EXAM: PORTABLE CHEST 1 VIEW COMPARISON:  12/13/2019 FINDINGS: Sternal wires overlie normal cardiac silhouette. Chest tube  at RIGHT lung base. Moderate size RIGHT effusion remains. No pneumothorax. LEFT lung clear IMPRESSION: 1. RIGHT pleural effusion with chest tube at the RIGHT lung base. 2. LEFT lung clear. Electronically Signed   By: Suzy Bouchard M.D.   On: 12/13/2019 18:08       Devante Capano T. [place]  If 7PM-7AM, please contact night-coverage [email or web address] Password [facility] 12/14/2019, 12:27 PM

## 2019-12-14 NOTE — Progress Notes (Signed)
Modified Barium Swallow Progress Note  Patient Details  Name: Joel [name] MRN: [id number] Date of Birth: 09-07-1935  Today's Date: 12/14/2019  Modified Barium Swallow completed.  Full report located under Chart Review in the Imaging Section.  Brief recommendations include the following:  Clinical Impression  Pt presents with mild oropharyngeal dysphagia characterized by reduced bolus cohesion, reduced lingual retraction, reduced anterior laryngeal movement, and a pharyngeal delay. He demonstrated trace-mild vallecular and pyriform sinus residue, and penetration (PAS 3) and inconsistent aspiration (PAS 7,8) of thin liquids via straw secondary to premature spillage and pharyngeal delay. A chin tuck posture was attempted to eliminate aspiration, but increased frequency of penetration with thin liquids. It is recommended that the current diet of regular texture solids with thin liquids be continued with observance of swallowing precautions. SLP will continue to follow pt.    Swallow Evaluation Recommendations       SLP Diet Recommendations: Regular solids;Thin liquid   Liquid Administration via: Cup;No straw   Medication Administration: Whole meds with liquid   Supervision: Patient able to self feed   Compensations: Slow rate;Small sips/bites   Postural Changes: Seated upright at 90 degrees   Oral Care Recommendations: Oral care BID;Patient independent with oral care      Joel [name] I. Hardin Negus, Joel [name], Joel [name] Office number [phone] Pager [phone]   Joel [name] 12/14/2019,2:30 PM

## 2019-12-14 NOTE — Evaluation (Signed)
Clinical/Bedside Swallow Evaluation Patient Details  Name: TAMER BAUGHMAN MRN: [id number] Date of Birth: Sep 06, 1935  Today's Date: 12/14/2019 Time: SLP Start Time (ACUTE ONLY): 5465 SLP Stop Time (ACUTE ONLY): 0948 SLP Time Calculation (min) (ACUTE ONLY): 23 min  Past Medical History:  Past Medical History:  Diagnosis Date  . Arthritis   . Bifascicular block   . Coronary artery disease    Post CABG in 2001 with patent grafts in 2010  (90% mid LAD stenosis with competing flow from LIMA distally, 90% OM stenosis with competing flow from SVG,  90% RCA stenosis with competing flow from SVG, occluded SVG to Posterolateral branch).  . Gastritis   . GERD (gastroesophageal reflux disease)   . History of nephrolithiasis   . Hyperlipidemia   . Hypertension   . Partial tear of rotator cuff(726.13)    Right  . Psoriasis    Past Surgical History:  Past Surgical History:  Procedure Laterality Date  . BACK SURGERY    . CARDIAC CATHETERIZATION  01/2000   Multi vessel PTCA  . CARDIAC CATHETERIZATION  11/2008   multi vesstel dz, re occlusion small vessel  . CORONARY ARTERY BYPASS GRAFT  2001  . CT ABD W & PELVIS WO CM  09/2001   Kidney stones  . CT ABD WO/W & PELVIS WO CM  10/14/2007   Low grade obstruction on R by 54mm prox ereteral calculus, Sm bilat renal calculi sm HH  . Diffuision of unstable angina  03/2000  . ESOPHAGOGASTRODUODENOSCOPY  08/2008   Mild gastritis and GERD  . INGUINAL HERNIA REPAIR Right 09/10/2015   Procedure: RIGHT INGUINAL HERNIA REPAIR WITH MESH;  Surgeon: Coralie Keens, MD;  Location: [facility];  Service: General;  Laterality: Right;  . INSERTION OF MESH Right 09/10/2015   Procedure: INSERTION OF MESH;  Surgeon: Coralie Keens, MD;  Location: [facility];  Service: General;  Laterality: Right;  . KIDNEY STONE SURGERY  10/2007   Removal R ureteral kidney stone  . NASAL SINUS SURGERY  04/2002  . NECK SURGERY    . Pelvic CT  10/14/2007   nml  . PTCA  01/2000    Subendocardial MI  . Stress cardiolite  10/2003   No acute changes  . TONSILLECTOMY     HPI:  Pt is an 84 y.o. male with medical history significant of CAD; HTN; and HLD presenting with SOB. CXR 6/9: Right-sided pleural effusion with likely underlying atelectasis/infiltrate. CT chest: Moderately large partially loculated right pleural effusion with fluid tracking into the minor fissure. Adjacent compressive atelectasis, including majority of the right lower lobe. No definite pleural nodularity or enhancement.    Assessment / Plan / Recommendation Clinical Impression  Pt reported symptoms of pharyngeal dysphagia for the past 20 years. He stated that he has been demonstrating coughing with p.o. intake, nasal regurgitation, and that food inconsistently "stops" when he swallows. He pointed to the sternal notch as the site of the "stopping". Oral mechanism exam was [facility] and pt was edentulous. He exhibited coughing with thin liquids via straw, suggesting aspiration and occasional clearing with thin liquids via cup. Other consistencies were tolerated without symptoms of oropharyngeal dysphagia. A modified barium swallow study is recommended to further assess swallow function and is scheduled for today at 1330.  SLP Visit Diagnosis: Dysphagia, unspecified (R13.10)    Aspiration Risk  Mild aspiration risk;Moderate aspiration risk    Diet Recommendation Regular;Thin liquid   Liquid Administration via: Cup;No straw Medication Administration: Whole meds with puree Supervision:  Patient able to self feed Compensations: Slow rate;Small sips/bites Postural Changes: Seated upright at 90 degrees    Other  Recommendations Oral Care Recommendations: Oral care BID   Follow up Recommendations  (TBD)      Frequency and Duration min 2x/week  1 week       Prognosis        Swallow Study   General Date of Onset: 12/14/99 HPI: Pt is an 84 y.o. male with medical history significant of CAD; HTN; and HLD  presenting with SOB. CXR 6/9: Right-sided pleural effusion with likely underlying atelectasis/infiltrate. CT chest: Moderately large partially loculated right pleural effusion with fluid tracking into the minor fissure. Adjacent compressive atelectasis, including majority of the right lower lobe. No definite pleural nodularity or enhancement.  Type of Study: Bedside Swallow Evaluation Previous Swallow Assessment: None Diet Prior to this Study: Regular;Thin liquids Temperature Spikes Noted: No Respiratory Status: Room air History of Recent Intubation: No Behavior/Cognition: Alert;Cooperative;Pleasant mood Oral Cavity Assessment: Within Functional Limits Oral Care Completed by SLP: No Vision: Functional for self-feeding Self-Feeding Abilities: Able to feed self Patient Positioning: Upright in bed;Postural control adequate for testing Baseline Vocal Quality: Normal Volitional Cough: Strong Volitional Swallow: Able to elicit    Oral/Motor/Sensory Function Overall Oral Motor/Sensory Function: Within functional limits   Ice Chips Ice chips: Within functional limits Presentation: Spoon   Thin Liquid Thin Liquid: Impaired Presentation: Straw Pharyngeal  Phase Impairments: Cough - Immediate Other Comments: No signs of aspiration with thin liquids via cup    Nectar Thick Nectar Thick Liquid: Not tested   Honey Thick Honey Thick Liquid: Not tested   Puree Puree: Within functional limits Presentation: Spoon   Solid     Solid: Within functional limits Presentation: [place] I. Hardin Negus, [place], [place] Office number [phone] Pager [phone]  Horton Marshall 12/14/2019,12:21 PM

## 2019-12-14 NOTE — Progress Notes (Signed)
NAME:  Joel [name], MRN:  [id number], DOB:  September 02, 1935, LOS: 0 ADMISSION DATE:  12/13/2019, CONSULTATION DATE: June 9 REFERRING MD: Dr. Lorin Mercy, CHIEF COMPLAINT: Flank pain  Brief History   84 year old male, [facility] told to come to the emergency room because of a loculated pleural effusion seen on the CT scan of the chest.  History of present illness   This is a pleasant 84 year old male who is followed by the [place] clinic who has experienced 3 months of right-sided flank pain.  He went to the [place] to be evaluated and initially had some imaging studies and was found to have evidence of a skin cancer on the right side of his face.  He was referred to plastic surgery and this was removed but the flank pain persisted he returned to his clinic at the [place].  There he says that he was referred for further imaging and a CT scan was ultimately performed which showed a fluid collection in his right chest.  He was advised by his clinic physicians to come to [facility] for further evaluation.  Here in the emergency room today he had a CT scan of his chest performed which shows a moderate to large right-sided pleural effusion which is loculated and an airway abnormality.  He denies significant shortness of breath though he has had some fatigue.  No recent weight loss.  He says that he has been wheezing some lately.  He says that he chokes on food fairly routinely and brings up mucus.  Approximately 10 days ago he had fevers and chills but he decided not to come get evaluated because it was a holiday weekend.  Past Medical History  GERD Coronary artery disease Hyperlipidemia Hypertension Nephrolithiasis  Significant Hospital Events     Consults:  Pulmonary  Procedures:  6/9 right-sided pigtail pleural drainage catheter >   Significant Diagnostic Tests:  June 9 CT angiogram chest images independently reviewed showing a moderate multi lobular loculated pleural effusion with  some compressive atelectasis, no clear mass or infiltrate.  Micro Data:  6/9 Right pleural fluid>  12/13/2019 blood cultures x2>  Antimicrobials:  None  Interim history/subjective:  Complains of pain at right chest tube insertion site.  Noted to have been drained greater than 1400 cc of fluid from right chest tube.  Objective   Blood pressure (!) 143/86, pulse 94, temperature 98.1 F (36.7 C), temperature source Oral, resp. rate 18, height 5\' 10"  (1.778 m), weight 81.5 kg, SpO2 94 %.        Intake/Output Summary (Last 24 hours) at 12/14/2019 0900 Last data filed at 12/14/2019 7591 Gross per 24 hour  Intake 10 ml  Output 2740 ml  Net -2730 ml   Filed Weights   12/13/19 2049 12/14/19 0603  Weight: 83 kg 81.5 kg    Examination:  General: Elderly male who is extremely hard of hearing complains of generalized right-sided chest pain at site of chest tube insertion HEENT: No JVD or lymphadenopathy is appreciated Neuro: Grossly intact no focal defects extremely hard of hearing CV: Heart sounds are distant PULM: Decreased breath sounds right greater than left Currently on room air GI: soft, bsx4 active  GU: Voids Extremities: warm/dry,  edema  Skin: no rashes or lesions    Resolved Hospital Problem list     Assessment & Plan:  Moderate to large sized loculated pleural effusion in right lung of uncertain etiology: Given his history of recurrent aspiration suspect  this is likely an empyema but given his smoking history malignancy is also possible.  Right chest tube placed 12/13/2019 1400 cc serosanguineous fluid has been obtained Specimens have been sent Serial chest x-ray, will order one for 12/14/2019. Monitor fluid analysis Not currently on antimicrobial therapy wbc 13, tmax 99.0   Likely recurrent aspiration: Swallowing evaluation  Hospital service are primary  Best practice:   per [facility]  Labs   CBC: Recent Labs  Lab 12/13/19 1130 12/14/19 0347  WBC 9.0 13.0*   NEUTROABS 5.7 9.2*  HGB 15.6 15.5  HCT 48.2 46.4  MCV 94.7 92.4  PLT 348 885    Basic Metabolic Panel: Recent Labs  Lab 12/13/19 1130 12/14/19 0347  NA 139 138  K 3.4* 3.6  CL 106 100  CO2 23 29  GLUCOSE 112* 139*  BUN 13 13  CREATININE 0.85 0.88  CALCIUM 8.5* 8.9   GFR: Estimated Creatinine Clearance: 68 mL/min (by C-G formula based on SCr of 0.88 mg/dL). Recent Labs  Lab 12/13/19 1130 12/13/19 1201 12/14/19 0347  WBC 9.0  --  13.0*  LATICACIDVEN  --  1.3  --     Liver Function Tests: Recent Labs  Lab 12/13/19 1130  AST 23  ALT 22  ALKPHOS 84  BILITOT 0.8  PROT 6.2*  ALBUMIN 3.2*   Recent Labs  Lab 12/13/19 1130  LIPASE 22   No results for input(s): AMMONIA in the last 168 hours.  ABG    Component Value Date/Time   TCO2 30 10/14/2007 2105     Coagulation Profile: No results for input(s): INR, PROTIME in the last 168 hours.  Cardiac Enzymes: No results for input(s): CKTOTAL, CKMB, CKMBINDEX, TROPONINI in the last 168 hours.  HbA1C: Hgb A1c MFr Bld  Date/Time Value Ref Range Status  03/08/2019 10:41 AM 6.3 4.6 - 6.5 % Final    Comment:    Glycemic Control Guidelines for People with Diabetes:Non Diabetic:  <6%Goal of Therapy: <7%Additional Action Suggested:  >8%   11/22/2017 09:26 AM 6.1 4.6 - 6.5 % Final    Comment:    Glycemic Control Guidelines for People with Diabetes:Non Diabetic:  <6%Goal of Therapy: <7%Additional Action Suggested:  >8%     CBG: No results for input(s): GLUCAP in the last 168 hours.     Richardson Landry Maquita Sandoval ACNP Acute Care Nurse Practitioner [place] Please consult Amion 12/14/2019, 9:01 AM

## 2019-12-14 NOTE — Progress Notes (Signed)
PCCM progress note  Patient reevaluated this afternoon including physical assessment and review of CT scan which revealed near complete removal of pleural effusion.  Therefore, decision made to remove pigtail chest tube.  This provider removed a pigtail chest tube at bedside with no complications seen.  Patient educated on the importance of follow-up appointment with pulmonary clinic in 3 weeks post discharge.  Appointment has been made and placed in discharge paperwork.  Patient will follow up with [name], NP June 30 at 10 at our pulmonary clinic  Johnsie Cancel, NP-C [place] / Pager information can be found on Amion  12/14/2019, 3:39 PM

## 2019-12-15 ENCOUNTER — Telehealth: Payer: Self-pay

## 2019-12-15 DIAGNOSIS — R5381 Other malaise: Secondary | ICD-10-CM

## 2019-12-15 DIAGNOSIS — J690 Pneumonitis due to inhalation of food and vomit: Principal | ICD-10-CM

## 2019-12-15 DIAGNOSIS — K219 Gastro-esophageal reflux disease without esophagitis: Secondary | ICD-10-CM

## 2019-12-15 DIAGNOSIS — D72825 Bandemia: Secondary | ICD-10-CM

## 2019-12-15 LAB — CBC WITH DIFFERENTIAL/PLATELET
Abs Immature Granulocytes: 0.03 10*3/uL (ref 0.00–0.07)
Basophils Absolute: 0 10*3/uL (ref 0.0–0.1)
Basophils Relative: 0 %
Eosinophils Absolute: 0.4 10*3/uL (ref 0.0–0.5)
Eosinophils Relative: 3 %
HCT: 46.2 % (ref 39.0–52.0)
Hemoglobin: 15.6 g/dL (ref 13.0–17.0)
Immature Granulocytes: 0 %
Lymphocytes Relative: 14 %
Lymphs Abs: 1.5 10*3/uL (ref 0.7–4.0)
MCH: 31.2 pg (ref 26.0–34.0)
MCHC: 33.8 g/dL (ref 30.0–36.0)
MCV: 92.4 fL (ref 80.0–100.0)
Monocytes Absolute: 1.5 10*3/uL — ABNORMAL HIGH (ref 0.1–1.0)
Monocytes Relative: 14 %
Neutro Abs: 7.4 10*3/uL (ref 1.7–7.7)
Neutrophils Relative %: 69 %
Platelets: 301 10*3/uL (ref 150–400)
RBC: 5 MIL/uL (ref 4.22–5.81)
RDW: 12 % (ref 11.5–15.5)
WBC: 10.9 10*3/uL — ABNORMAL HIGH (ref 4.0–10.5)
nRBC: 0 % (ref 0.0–0.2)

## 2019-12-15 MED ORDER — HYDROCHLOROTHIAZIDE 25 MG PO TABS: 25.0000 mg | ORAL_TABLET | Freq: Every day | ORAL | 1 refills | Status: AC

## 2019-12-15 MED ORDER — AMLODIPINE BESYLATE 5 MG PO TABS: 5.0000 mg | ORAL_TABLET | Freq: Every day | ORAL | 1 refills | Status: AC

## 2019-12-15 MED ORDER — PROBIOTIC ACIDOPHILUS PO CAPS: 1.0000 | ORAL_CAPSULE | Freq: Two times a day (BID) | ORAL | 0 refills | Status: DC

## 2019-12-15 MED ORDER — ACETAMINOPHEN 500 MG PO TABS: 1000.0000 mg | ORAL_TABLET | Freq: Three times a day (TID) | ORAL | 0 refills | Status: AC

## 2019-12-15 MED ORDER — AMOXICILLIN-POT CLAVULANATE 875-125 MG PO TABS
1.0000 | ORAL_TABLET | Freq: Two times a day (BID) | ORAL | 0 refills | Status: AC
Start: 2019-12-15 — End: 2019-12-29

## 2019-12-15 NOTE — Discharge Summary (Signed)
Physician Discharge Summary  Joel [name] KZS:[id number] DOB: [birth year] DOA: 12/13/2019  PCP: Abner Greenspan, MD  Admit date: 12/13/2019 Discharge date: 12/15/2019  Admitted From: Home Disposition: Home  Recommendations for Outpatient Follow-up:  1. Follow ups as below. 2. Please obtain CBC/BMP/Mag at follow up 3. Please follow up on the following pending results: Pleural fluid cytology  Home Health: Outpatient PT ordered.  Not able to obtain home health PT Equipment/Devices: None  Discharge Condition: Stable CODE STATUS: Full code   Follow-up Information    Lauraine Rinne, NP Follow up.   Specialty: Pulmonary Disease Why: Please follow up with [name] NP June 30th at 11:30  Contact information: [place] [address] [phone]        Abner Greenspan, MD. Schedule an appointment as soon as possible for a visit in 1 week(s).   Specialties: Family Medicine, Radiology Contact information: [place] [address] [phone]                Hospital Course: 84 year old male with history of CAD, nephrolithiasis, HTN, HLD, GERD and former smoker (75-pack-year history) presenting with right flank pain for over a month.  He had CT at [facility] several weeks ago that has resulted in moderately large right-sided pleural effusion and he was directed to come to ED.  Of note, patient reports fever and chills about a month ago for which she was not evaluated.  Also occasional dysphagia with solids.  In ED, CTA with moderate partially loculated effusion with fluid tracking into the minor fissure and adjacent compressive atelectasis.  PCCM consulted and placed chest tube with removal of 1400 cc exudative fluid.      Pleural fluid Gram stain with GPC's but pleural fluid culture NGTD.  Patient was started on IV Unasyn.  Repeat CXR/CT chest with near resolution of pleural fluid.  He was cleared by pulmonology for discharge on p.o. Augmentin  and outpatient follow-up in the office.  Pleural fluid cytology is pending.  Patient was evaluated by therapy who recommended home health.  Unfortunately, we were not able to secure home health.  So ambulatory referral to outpatient therapy was ordered.  See individual problem list below for more on hospital course.  Discharge Diagnoses:  Possible aspiration pneumonia with moderate to large loculated right lung pleural effusion -Underwent thoracocentesis with removal of 1400 cc exudative fluid -Pleural fluid Gram stain with GPC's but culture negative.  -Pleural fluid cytology pending. -Repeat chest x-ray and CT chest with near resolution of pleural effusion. -Started on Unasyn and discharged on p.o. Augmentin for 10 more days. -Outpatient follow-up with pulmonology as above. -Recommended probiotics while on antibiotics.  Concern about dysphagia-evaluated by SLP and had MBS.  Deemed to have mild aspiration risk. -Aspiration precaution discussed.  History of CAD: No anginal symptoms. -Continue home meds  Essential hypertension: Normotensive -Decrease hydrochlorothiazide to 25 mg daily -Added amlodipine 5 mg daily. -Reassess BP and adjust as appropriate at follow-up.  GERD -Continue Prilosec.  Debility -Ambulatory referral to PT  Leukocytosis/bandemia: Likely due to #1.  Improved. -Repeat CBC at follow-up                 Discharge Exam: Vitals:   12/15/19 0417 12/15/19 0752  BP: (!) 163/91 133/69  Pulse: 82   Resp: 20 18  Temp: 98.1 F (36.7 C) 97.9 F (36.6 C)  SpO2: 94%     GENERAL: Walking with therapy.  No apparent distress.  Nontoxic. HEENT: MMM.  Vision and hearing grossly intact.  NECK: Supple.  No apparent JVD.  RESP: On room air.  No IWOB.  Fair aeration bilaterally. CVS:  RRR. Heart sounds normal.  ABD/GI/GU: Bowel sounds present. Soft. Non tender.  MSK/EXT:  Moves extremities. No apparent deformity. No edema.  SKIN: no apparent skin lesion  or wound NEURO: Awake, alert and oriented appropriately.  No apparent focal neuro deficit. PSYCH: Calm. Normal affect.  Discharge Instructions  Discharge Instructions    Call MD for:  difficulty breathing, headache or visual disturbances   Complete by: As directed    Call MD for:  extreme fatigue   Complete by: As directed    Call MD for:  severe uncontrolled pain   Complete by: As directed    Call MD for:  temperature >100.4   Complete by: As directed    Diet - low sodium heart healthy   Complete by: As directed    Discharge instructions   Complete by: As directed    It has been a pleasure taking care of you! You were hospitalized and treated for right-sided chest/flank pain likely due to fluid collection around your lung.  We believe this is related to infection.  However, there could be other potential possibilities including cancer but there are some tests that have not resulted at this time. The pulmonologist will follow up on these results and discuss with you when you see them in three weeks. We are discharging you on antibiotics to complete treatment course for possible infection. We may have started you on new medications or made some changes to your home medications during this hospitalization. Please review your new medication list and the directions carefully before you take them.  Please go to your hospital follow-up appointments or call to reschedule as recommended.    Take care,   Increase activity slowly   Complete by: As directed    No wound care   Complete by: As directed      Allergies as of 12/15/2019   No Known Allergies     Medication List    TAKE these medications   acetaminophen 500 MG tablet Commonly known as: TYLENOL Take 2 tablets (1,000 mg total) by mouth every 8 (eight) hours for 5 days.   amLODipine 5 MG tablet Commonly known as: NORVASC Take 1 tablet (5 mg total) by mouth daily.   amoxicillin-clavulanate 875-125 MG tablet Commonly known  as: Augmentin Take 1 tablet by mouth 2 (two) times daily for 14 days.   aspirin EC 81 MG tablet Take 1 tablet (81 mg total) by mouth daily.   Fish Oil 1000 MG Caps Take 1 capsule by mouth daily.   hydrochlorothiazide 25 MG tablet Commonly known as: HYDRODIURIL Take 1 tablet (25 mg total) by mouth daily. What changed:   medication strength  how much to take   latanoprost 0.005 % ophthalmic solution Commonly known as: XALATAN Place 1 drop into both eyes at bedtime.   multivitamin tablet Take 1 tablet by mouth daily.   omeprazole 20 MG capsule Commonly known as: PRILOSEC Take 1 capsule (20 mg total) by mouth daily as needed. For acid reflux What changed: when to take this   Probiotic Acidophilus Caps Take 1 capsule by mouth in the morning and at bedtime.   Robitussin Cough+Chest Cong DM 20-200 MG/20ML Liqd Generic drug: Dextromethorphan-guaiFENesin Take 20 mLs by mouth at bedtime as needed (coughing).       Consultations:  Pulmonology  Procedures/Studies:  12/13/2019-right-sided thoracocentesis with removal of 1400 cc exudative fluid   DG Chest 2 View  Result Date: 12/13/2019 CLINICAL DATA:  Shortness of breath and right-sided chest pain, initial encounter EXAM: CHEST - 2 VIEW COMPARISON:  10/24/2013 FINDINGS: Cardiac shadow is stable. Postsurgical changes are again noted. Left lung is clear. Right-sided effusion with likely underlying atelectasis is noted. No rib abnormality is noted. Degenerative changes of the thoracic spine are seen. IMPRESSION: Right-sided pleural effusion with likely underlying atelectasis/infiltrate Electronically Signed   By: Inez Catalina M.D.   On: 12/13/2019 11:04   CT CHEST WO CONTRAST  Result Date: 12/14/2019 CLINICAL DATA:  Status post right chest tube placement EXAM: CT CHEST WITHOUT CONTRAST TECHNIQUE: Multidetector CT imaging of the chest was performed following the standard protocol without IV contrast. COMPARISON:  Chest x-ray from  earlier in the same day, CT from the previous day FINDINGS: Cardiovascular: Somewhat limited due to lack of IV contrast. Coronary calcifications are seen. Changes of prior coronary bypass grafting are noted. Aortic calcifications are seen. Mediastinum/Nodes: Thoracic inlet is within normal limits. No hilar or mediastinal adenopathy is noted. The esophagus as visualized is unremarkable. Lungs/Pleura: Left lung is well aerated with the exception of minimal basilar atelectasis. Following chest tube placement on the right there has been significant reduction in right-sided pleural effusion with only minimal infiltrate remaining. Some lower lobe consolidation is noted. No pneumothorax is seen. Upper Abdomen: Visualized upper abdomen demonstrates multiple gallstones without complicating factors. Musculoskeletal: No acute bony abnormality is noted. IMPRESSION: Near complete resolution of right-sided pleural effusion following chest tube placement. No pneumothorax is noted. Mild left basilar atelectasis and right lower lobe consolidation. Cholelithiasis without complicating factors. Aortic Atherosclerosis (ICD10-I70.0). Electronically Signed   By: Inez Catalina M.D.   On: 12/14/2019 13:21   CT Angio Chest PE W and/or Wo Contrast  Result Date: 12/13/2019 CLINICAL DATA:  Shortness of breath, pleural effusion, cough. EXAM: CT ANGIOGRAPHY CHEST WITH CONTRAST TECHNIQUE: Multidetector CT imaging of the chest was performed using the standard protocol during bolus administration of intravenous contrast. Multiplanar CT image reconstructions and MIPs were obtained to evaluate the vascular anatomy. CONTRAST:  22mL OMNIPAQUE IOHEXOL 350 MG/ML SOLN COMPARISON:  Radiograph earlier this day. FINDINGS: Cardiovascular: There are no filling defects within the pulmonary arteries to suggest pulmonary embolus. Aortic atherosclerosis and tortuosity. Cannot assess for dissection given contrast tailored to pulmonary artery evaluation. Post CABG  with dense calcification of native coronary arteries. No pericardial effusion. Mediastinum/Nodes: 17 mm right hilar node, series 5, image 46. 12 mm right infrahilar node, series 5, image 56. Small mediastinal nodes not enlarged by size criteria. There is no left hilar adenopathy. No esophageal wall thickening. No thyroid nodule. Lungs/Pleura: Moderately large partially loculated right pleural effusion with fluid tracking into the minor fissure. No definite pleural nodularity or enhancement. Adjacent compressive atelectasis, including majority of the right lower lobe. Small right upper lobe pulmonary nodule measuring 5 mm, series 7, image 33. Punctate subpleural right upper lobe pulmonary nodule, series 7, image 46. There is also a calcified granuloma in the right lower lobe. Trachea and mainstem bronchi are patent. There is bronchial occlusion/filling of basilar segmental bronchus on the right which may be mucous plugging but is nonspecific. There is no left pleural effusion. Tiny faint pulmonary nodule the left lung apex, series 7, image 17. Upper Abdomen: Homogeneous attenuation of the liver and spleen. No acute finding in the included upper abdomen. Musculoskeletal: No acute osseous abnormality or  focal bone lesion. Median sternotomy. Review of the MIP images confirms the above findings. IMPRESSION: 1. No pulmonary embolus. 2. Moderately large partially loculated right pleural effusion with fluid tracking into the minor fissure. Adjacent compressive atelectasis, including majority of the right lower lobe. No definite pleural nodularity or enhancement. 3. Bronchial occlusion/filling of basilar segmental bronchus on the right which may be mucous plugging but is nonspecific. 4. Enlarged right hilar nodes which are nonspecific. 5. Small bilateral pulmonary nodules, largest measuring 5 mm in the right upper lobe. No dominant pulmonary mass. Given right hilar adenopathy in unilateral pleural effusion, consider pleural  fluid sampling. Aortic Atherosclerosis (ICD10-I70.0). Electronically Signed   By: Keith Rake M.D.   On: 12/13/2019 15:43   DG Chest Port 1 View  Result Date: 12/14/2019 CLINICAL DATA:  Chest tube, right-sided chest pain and shortness of breath EXAM: PORTABLE CHEST 1 VIEW COMPARISON:  12/13/2019 FINDINGS: No significant interval change in AP portable chest radiograph with a right-sided pigtail chest tube remaining about the right lung base. Unchanged small right pleural effusion and associated atelectasis or consolidation. Probable trace left pleural effusion. Cardiomegaly status post median sternotomy IMPRESSION: No significant interval change in AP portable chest radiograph with a right-sided pigtail chest tube remaining about the right lung base. Small right pleural effusion and associated atelectasis or consolidation. Electronically Signed   By: Eddie Candle M.D.   On: 12/14/2019 10:20   DG Chest Port 1 View  Result Date: 12/13/2019 CLINICAL DATA:  Chest tube in place EXAM: PORTABLE CHEST 1 VIEW COMPARISON:  12/13/2019 FINDINGS: Sternal wires overlie normal cardiac silhouette. Chest tube at RIGHT lung base. Moderate size RIGHT effusion remains. No pneumothorax. LEFT lung clear IMPRESSION: 1. RIGHT pleural effusion with chest tube at the RIGHT lung base. 2. LEFT lung clear. Electronically Signed   By: Suzy Bouchard M.D.   On: 12/13/2019 18:08   DG Swallowing Func-Speech Pathology  Result Date: 12/14/2019 Objective Swallowing Evaluation: Type of Study: MBS-Modified Barium Swallow Study  Patient Details Name: CAPONE SCHWINN MRN: [id number] Date of Birth: [birth year] Today's Date: 12/14/2019 Time: SLP Start Time (ACUTE ONLY): 1320 -SLP Stop Time (ACUTE ONLY): 1341 SLP Time Calculation (min) (ACUTE ONLY): 21 min Past Medical History: Past Medical History: Diagnosis Date . Arthritis  . Bifascicular block  . Coronary artery disease   Post CABG in 2001 with patent grafts in 2010  (90% mid LAD stenosis with  competing flow from LIMA distally, 90% OM stenosis with competing flow from SVG,  90% RCA stenosis with competing flow from SVG, occluded SVG to Posterolateral branch). . Gastritis  . GERD (gastroesophageal reflux disease)  . History of nephrolithiasis  . Hyperlipidemia  . Hypertension  . Partial tear of rotator cuff(726.13)   Right . Psoriasis  Past Surgical History: Past Surgical History: Procedure Laterality Date . BACK SURGERY   . CARDIAC CATHETERIZATION  01/2000  Multi vessel PTCA . CARDIAC CATHETERIZATION  11/2008  multi vesstel dz, re occlusion small vessel . CORONARY ARTERY BYPASS GRAFT  2001 . CT ABD W & PELVIS WO CM  09/2001  Kidney stones . CT ABD WO/W & PELVIS WO CM  10/14/2007  Low grade obstruction on R by 33mm prox ereteral calculus, Sm bilat renal calculi sm HH . Diffuision of unstable angina  03/2000 . ESOPHAGOGASTRODUODENOSCOPY  08/2008  Mild gastritis and GERD . INGUINAL HERNIA REPAIR Right 09/10/2015  Procedure: RIGHT INGUINAL HERNIA REPAIR WITH MESH;  Surgeon: Coralie Keens, MD;  Location: [facility];  Service: General;  Laterality: Right; . INSERTION OF MESH Right 09/10/2015  Procedure: INSERTION OF MESH;  Surgeon: Coralie Keens, MD;  Location: [facility];  Service: General;  Laterality: Right; . KIDNEY STONE SURGERY  10/2007  Removal R ureteral kidney stone . NASAL SINUS SURGERY  04/2002 . NECK SURGERY   . Pelvic CT  10/14/2007  nml . PTCA  01/2000  Subendocardial MI . Stress cardiolite  10/2003  No acute changes . TONSILLECTOMY   HPI: Pt is an 84 y.o. male with medical history significant of CAD; HTN; and HLD presenting with SOB. CXR 6/9: Right-sided pleural effusion with likely underlying atelectasis/infiltrate. CT chest: Moderately large partially loculated right pleural effusion with fluid tracking into the minor fissure. Adjacent compressive atelectasis, including majority of the right lower lobe. No definite pleural nodularity or enhancement.  No data recorded Assessment / Plan / Recommendation  CHL IP CLINICAL IMPRESSIONS 12/14/2019 Clinical Impression Pt presents with mild oropharyngeal dysphagia characterized by reduced bolus cohesion, reduced lingual retraction, reduced anterior laryngeal movement, and a pharyngeal delay. He demonstrated trace-mild vallecular and pyriform sinus residue, and penetration (PAS 3) and inconsistent aspiration (PAS 7,8) of thin liquids via straw secondary to premature spillage and pharyngeal delay. A chin tuck posture was attempted to eliminate aspiration, but increased frequency of penetration with thin liquids. It is recommended that the current diet of regular texture solids with thin liquids be continued with observance of swallowing precautions. SLP will continue to follow pt. SLP Visit Diagnosis Dysphagia, oropharyngeal phase (R13.12) Attention and concentration deficit following -- Frontal lobe and executive function deficit following -- Impact on safety and function Mild aspiration risk   CHL IP TREATMENT RECOMMENDATION 12/14/2019 Treatment Recommendations Therapy as outlined in treatment plan below   Prognosis 12/14/2019 Prognosis for Safe Diet Advancement Good Barriers to Reach Goals -- Barriers/Prognosis Comment -- CHL IP DIET RECOMMENDATION 12/14/2019 SLP Diet Recommendations Regular solids;Thin liquid Liquid Administration via Cup;No straw Medication Administration Whole meds with liquid Compensations Slow rate;Small sips/bites Postural Changes Seated upright at 90 degrees   CHL IP OTHER RECOMMENDATIONS 12/14/2019 Recommended Consults -- Oral Care Recommendations Oral care BID;Patient independent with oral care Other Recommendations --   CHL IP FOLLOW UP RECOMMENDATIONS 12/14/2019 Follow up Recommendations Outpatient SLP   CHL IP FREQUENCY AND DURATION 12/14/2019 Speech Therapy Frequency (ACUTE ONLY) min 2x/week Treatment Duration 2 weeks      CHL IP ORAL PHASE 12/14/2019 Oral Phase Impaired Oral - Pudding Teaspoon -- Oral - Pudding Cup -- Oral - Honey Teaspoon -- Oral -  Honey Cup -- Oral - Nectar Teaspoon -- Oral - Nectar Cup -- Oral - Nectar Straw -- Oral - Thin Teaspoon -- Oral - Thin Cup Decreased bolus cohesion;Premature spillage Oral - Thin Straw Decreased bolus cohesion;Premature spillage Oral - Puree -- Oral - Mech Soft -- Oral - Regular -- Oral - Multi-Consistency -- Oral - Pill -- Oral Phase - Comment --  CHL IP PHARYNGEAL PHASE 12/14/2019 Pharyngeal Phase Impaired Pharyngeal- Pudding Teaspoon -- Pharyngeal -- Pharyngeal- Pudding Cup -- Pharyngeal -- Pharyngeal- Honey Teaspoon -- Pharyngeal -- Pharyngeal- Honey Cup -- Pharyngeal -- Pharyngeal- Nectar Teaspoon -- Pharyngeal -- Pharyngeal- Nectar Cup -- Pharyngeal -- Pharyngeal- Nectar Straw -- Pharyngeal -- Pharyngeal- Thin Teaspoon -- Pharyngeal -- Pharyngeal- Thin Cup Reduced anterior laryngeal mobility;Pharyngeal residue - valleculae;Reduced tongue base retraction;Pharyngeal residue - pyriform Pharyngeal Material does not enter airway Pharyngeal- Thin Straw Penetration/Aspiration before swallow;Penetration/Aspiration during swallow;Reduced anterior laryngeal mobility;Pharyngeal residue - valleculae;Reduced tongue base retraction Pharyngeal Material enters airway, remains ABOVE vocal  cords and not ejected out;Material enters airway, passes BELOW cords and not ejected out despite cough attempt by patient;Material enters airway, passes BELOW cords without attempt by patient to eject out (silent aspiration) Pharyngeal- Puree Reduced anterior laryngeal mobility;Pharyngeal residue - valleculae;Reduced tongue base retraction;Pharyngeal residue - pyriform Pharyngeal Material does not enter airway Pharyngeal- Mechanical Soft -- Pharyngeal -- Pharyngeal- Regular Reduced anterior laryngeal mobility;Pharyngeal residue - valleculae;Reduced tongue base retraction;Pharyngeal residue - pyriform Pharyngeal -- Pharyngeal- Multi-consistency -- Pharyngeal -- Pharyngeal- Pill Reduced anterior laryngeal mobility;Pharyngeal residue -  valleculae;Reduced tongue base retraction;Pharyngeal residue - pyriform Pharyngeal -- Pharyngeal Comment --  CHL IP CERVICAL ESOPHAGEAL PHASE 12/14/2019 Cervical Esophageal Phase WFL Pudding Teaspoon -- Pudding Cup -- Honey Teaspoon -- Honey Cup -- Nectar Teaspoon -- Nectar Cup -- Nectar Straw -- Thin Teaspoon -- Thin Cup -- Thin Straw -- Puree -- Mechanical Soft -- Regular -- Multi-consistency -- Pill -- Cervical Esophageal Comment -- Shanika I. Hardin Negus, [place], [place] Office number [phone] Pager [place] 12/14/2019, 2:32 PM                   The results of significant diagnostics from this hospitalization (including imaging, microbiology, ancillary and laboratory) are listed below for reference.     Microbiology: Recent Results (from the past 240 hour(s))  Blood culture (routine x 2)     Status: None (Preliminary result)   Collection Time: 12/13/19 11:38 AM   Specimen: BLOOD LEFT ARM  Result Value Ref Range Status   Specimen Description BLOOD LEFT ARM  Final   Special Requests   Final    BOTTLES DRAWN AEROBIC AND ANAEROBIC Blood Culture adequate volume   Culture   Final    NO GROWTH 2 DAYS Performed at [facility], [address], [place], [place] [address]    Report Status PENDING  Incomplete  Blood culture (routine x 2)     Status: None (Preliminary result)   Collection Time: 12/13/19 11:45 AM   Specimen: BLOOD  Result Value Ref Range Status   Specimen Description BLOOD LEFT ANTECUBITAL  Final   Special Requests   Final    BOTTLES DRAWN AEROBIC AND ANAEROBIC Blood Culture adequate volume   Culture   Final    NO GROWTH 2 DAYS Performed at [facility], [place] [address], [place], [place] [address]    Report Status PENDING  Incomplete  SARS Coronavirus 2 by RT PCR (hospital order, performed in [facility] hospital lab) Nasopharyngeal Nasopharyngeal Swab     Status: None   Collection Time: 12/13/19  3:45 PM    Specimen: Nasopharyngeal Swab  Result Value Ref Range Status   SARS Coronavirus 2 NEGATIVE NEGATIVE Final    Comment: (NOTE) SARS-CoV-2 target nucleic acids are NOT DETECTED. The SARS-CoV-2 RNA is generally detectable in upper and lower respiratory specimens during the acute phase of infection. The lowest concentration of SARS-CoV-2 viral copies this assay can detect is 250 copies / mL. A negative result does not preclude SARS-CoV-2 infection and should not be used as the sole basis for treatment or other patient management decisions.  A negative result may occur with improper specimen collection / handling, submission of specimen other than nasopharyngeal swab, presence of viral mutation(s) within the areas targeted by this assay, and inadequate number of viral copies (<250 copies / mL). A negative result must be combined with clinical observations, patient history, and epidemiological information. Fact Sheet for Patients:   StrictlyIdeas.no Fact Sheet  for Healthcare Providers: BankingDealers.co.za This test is not yet approved or cleared  by the [place] and has been authorized for detection and/or diagnosis of SARS-CoV-2 by FDA under an Emergency Use Authorization (EUA).  This EUA will remain in effect (meaning this test can be used) for the duration of the COVID-19 declaration under Section 564(b)(1) of the Act, 21 U.S.C. section 360bbb-3(b)(1), unless the authorization is terminated or revoked sooner. Performed at [facility], [place] [address], [place], [place] [address]   Culture, body fluid-bottle     Status: None (Preliminary result)   Collection Time: 12/13/19  5:21 PM   Specimen: Fluid  Result Value Ref Range Status   Specimen Description FLUID PLEURAL  Final   Special Requests BOTTLES DRAWN AEROBIC AND ANAEROBIC  Final   Gram Stain   Final    GRAM POSITIVE COCCI AEROBIC BOTTLE ONLY CRITICAL RESULT CALLED TO,  READ BACK BY AND VERIFIED WITHBronwen Betters RN [id number] 12/14/19 A BROWNING    Culture   Final    CULTURE REINCUBATED FOR BETTER GROWTH Performed at [facility], [place] [address] [place], [place] [address]    Report Status PENDING  Incomplete  Gram stain     Status: None   Collection Time: 12/13/19  5:21 PM   Specimen: Fluid  Result Value Ref Range Status   Specimen Description FLUID PLEURAL  Final   Special Requests NONE  Final   Gram Stain   Final    MODERATE WBC PRESENT, PREDOMINANTLY PMN NO ORGANISMS SEEN Performed at [facility], [place] [address], [place], [place] [address]    Report Status 12/14/2019 FINAL  Final     Labs: BNP (last 3 results) Recent Labs    12/13/19 1130  BNP 87.5   Basic Metabolic Panel: Recent Labs  Lab 12/13/19 1130 12/14/19 0347  NA 139 138  K 3.4* 3.6  CL 106 100  CO2 23 29  GLUCOSE 112* 139*  BUN 13 13  CREATININE 0.85 0.88  CALCIUM 8.5* 8.9   Liver Function Tests: Recent Labs  Lab 12/13/19 1130  AST 23  ALT 22  ALKPHOS 84  BILITOT 0.8  PROT 6.2*  ALBUMIN 3.2*   Recent Labs  Lab 12/13/19 1130  LIPASE 22   No results for input(s): AMMONIA in the last 168 hours. CBC: Recent Labs  Lab 12/13/19 1130 12/14/19 0347 12/15/19 0614  WBC 9.0 13.0* 10.9*  NEUTROABS 5.7 9.2* 7.4  HGB 15.6 15.5 15.6  HCT 48.2 46.4 46.2  MCV 94.7 92.4 92.4  PLT 348 336 301   Cardiac Enzymes: No results for input(s): CKTOTAL, CKMB, CKMBINDEX, TROPONINI in the last 168 hours. BNP: Invalid input(s): POCBNP CBG: No results for input(s): GLUCAP in the last 168 hours. D-Dimer No results for input(s): DDIMER in the last 72 hours. Hgb A1c No results for input(s): HGBA1C in the last 72 hours. Lipid Profile No results for input(s): CHOL, HDL, LDLCALC, TRIG, CHOLHDL, LDLDIRECT in the last 72 hours. Thyroid function studies No results for input(s): TSH, T4TOTAL, T3FREE, THYROIDAB in the last 72 hours.  Invalid input(s): FREET3 Anemia work  up No results for input(s): VITAMINB12, FOLATE, FERRITIN, TIBC, IRON, RETICCTPCT in the last 72 hours. Urinalysis    Component Value Date/Time   COLORURINE YELLOW 10/14/2007 2058   APPEARANCEUR CLEAR 10/14/2007 2058   LABSPEC 1.020 10/14/2007 2058   PHURINE 5.5 10/14/2007 2058   GLUCOSEU NEGATIVE 10/14/2007 2058   HGBUR LARGE (A) 10/14/2007  2058   [place] 10/14/2007 2058   [place] 10/14/2007 2058   PROTEINUR NEGATIVE 10/14/2007 2058   UROBILINOGEN 0.2 10/14/2007 2058   NITRITE NEGATIVE 10/14/2007 2058   LEUKOCYTESUR NEGATIVE 10/14/2007 2058   Sepsis Labs Invalid input(s): PROCALCITONIN,  WBC,  LACTICIDVEN   Time coordinating discharge: 35 minutes  SIGNED:  Mercy Riding, MD  [facility] 12/15/2019, 8:13 AM  If 7PM-7AM, please contact night-coverage [email or web address] Password [email or web address]

## 2019-12-15 NOTE — Plan of Care (Signed)
Pt d/c to home with self care, all d/c education completed.

## 2019-12-15 NOTE — Evaluation (Signed)
Occupational Therapy Evaluation Patient Details Name: Joel [name] MRN: [id number] DOB: 05-Aug-1935 Today's Date: 12/15/2019    History of Present Illness 84 y.o. male presenting with SOB. Found to have pleural effusion s.p TRH and chest tube placement removed on 6/10. PMHx significant for tobacco use, CAD, HTN, HLD, glaucoma, and skin CA (face) recently removed.   Clinical Impression   Prior to hospital admission, patient was living alone in a single-level private residence with 5 STE and bilateral rails. Patient reports independence with all ADLs/IADLs including cooking/cleaning/driving. Patient also reports enjoying gardening and fishing on his boat. Patient currently presents below baseline level of function requiring Min guard for toilet transfers to standard commode with use of R grab bar for sit to stand, and Min guard for completion of grooming tasks standing at sink level. Patient also limited by decrease short-term memory, decreased safety awareness, decreased understanding of need for assistance, and decreased receptiveness to therapists recommendations with patient declining all DME/AE. Patient states that he has garb bars sitting in his garage but wont let anyone install them. Patient also has limited PRN assistance from his daughter who he states has "personal issues of her own". Patient would benefit from continued acute OT services to improve safety and independence with self-care tasks, functional transfers and mobility prior to d/c home with recommendation for HHOT and 24 hour supervision/assist.     Follow Up Recommendations  Home health OT;Supervision/Assistance - 24 hour    Equipment Recommendations  3 in 1 bedside commode    Recommendations for Other Services       Precautions / Restrictions Precautions Precautions: Fall Restrictions Weight Bearing Restrictions: No      Mobility Bed Mobility                  Transfers Overall transfer level: Needs  assistance   Transfers: Sit to/from Stand Sit to Stand: Supervision         General transfer comment: supervision for sit>stand from straight backed chair with arms after seated rest break after stair training     Balance Overall balance assessment: Needs assistance Sitting-balance support: No upper extremity supported;Feet supported Sitting balance-Leahy Scale: Fair     Standing balance support: No upper extremity supported;During functional activity Standing balance-Leahy Scale: Fair                             ADL either performed or assessed with clinical judgement   ADL                                               Vision Baseline Vision/History: No visual deficits Patient Visual Report: No change from baseline Vision Assessment?: Yes     Perception     Praxis      Pertinent Vitals/Pain Pain Assessment: 0-10 Pain Score: 7  Pain Location: R flank (Intermittent x4 months) Pain Descriptors / Indicators: Sharp Pain Intervention(s): Limited activity within patient's tolerance;Monitored during session;Repositioned     Hand Dominance Right   Extremity/Trunk Assessment Upper Extremity Assessment Upper Extremity Assessment: Defer to OT evaluation   Lower Extremity Assessment Lower Extremity Assessment: Generalized weakness       Communication Communication Communication: No difficulties   Cognition Arousal/Alertness: Awake/alert Behavior During Therapy: WFL for tasks assessed/performed Overall Cognitive Status: Impaired/Different from baseline Area of Impairment: Safety/judgement;Awareness;Problem  solving;Memory                     Memory: Decreased short-term memory   Safety/Judgement: Decreased awareness of safety;Decreased awareness of deficits Awareness: Emergent Problem Solving: Requires verbal cues;Difficulty sequencing;Requires tactile cues;Slow processing General Comments: pt exhibits short term memory loss  with in conversation, has reduced awareness of his safety deficits, reports bouts of lightheadness and memory loss but still drives and does not see problem with that   General Comments  max HR 95bpm, SaO2 on RA with ambulation 95%O2     Exercises     Shoulder Instructions      Home Living Family/patient expects to be discharged to:: Private residence Living Arrangements: Alone Available Help at Discharge: Available PRN/intermittently;Family (daughter assists however states she has medical problems) Type of Home: House Home Access: Stairs to enter [employer] of Steps: 5 Entrance Stairs-Rails: Can reach both Home Layout: One level     Bathroom Shower/Tub: Teacher, early years/pre: Standard     Home Equipment: Environmental consultant - 2 wheels;Wheelchair - manual (Has grab bars but does not want to install them)   Additional Comments: pt refuses to use DME stating "if I have to use them, I hope someone will shoot me."       Prior Functioning/Environment Level of Independence: Independent                 OT Problem List: Impaired balance (sitting and/or standing);Decreased safety awareness;Decreased knowledge of use of DME or AE      OT Treatment/Interventions: Self-care/ADL training;DME and/or AE instruction;Therapeutic activities;Patient/family education;Balance training    OT Goals(Current goals can be found in the care plan section) Acute Rehab OT Goals Patient Stated Goal: get back to fishing and walking in woods with dog OT Goal Formulation: With patient Time For Goal Achievement: 12/29/19 ADL Goals Pt Will Perform Lower Body Dressing: with modified independence;sit to/from stand Pt Will Transfer to Toilet: with modified independence;ambulating Pt Will Perform Toileting - Clothing Manipulation and hygiene: with modified independence;sit to/from stand Additional ADL Goal #1: Patient will recall and demonstrate 3 safety techniques in prep for completion of  BADLs with/without use of DME/AE.  OT Frequency: Min 2X/week   Barriers to D/C: Decreased caregiver support  Patient lives alone with PRN supervision/assist from dtr       Co-evaluation              AM-PAC OT "6 Clicks" Daily Activity     Outcome Measure Help from another person eating meals?: None Help from another person taking care of personal grooming?: A Little Help from another person toileting, which includes using toliet, bedpan, or urinal?: A Little Help from another person bathing (including washing, rinsing, drying)?: A Little Help from another person to put on and taking off regular upper body clothing?: A Little Help from another person to put on and taking off regular lower body clothing?: A Little 6 Click Score: 19   End of Session Equipment Utilized During Treatment: Gait belt  Activity Tolerance: Patient tolerated treatment well Patient left: Other (comment) (In standing with PT present for evaluation)  OT Visit Diagnosis: Unsteadiness on feet (R26.81);Pain Pain - Right/Left: Right Pain - part of body:  (Flank)                Time: 9323-5573 OT Time Calculation (min): 32 min Charges:  OT General Charges $OT Visit: 1 Visit OT Evaluation $OT Eval Low Complexity: 1 Low  Gloris Manchester OTR/L Supplemental OT, Department of rehab services [phone]  Brianah Hopson R H. 12/15/2019, 10:10 AM

## 2019-12-15 NOTE — Evaluation (Signed)
Physical Therapy Evaluation Patient Details Name: Joel [name] MRN: [id number] DOB: [birth year] Today's Date: 12/15/2019   History of Present Illness  84 y.o. male presenting with SOB. Found to have pleural effusion s.p TRH and chest tube placement removed on 6/10. PMHx significant for tobacco use, CAD, HTN, HLD, glaucoma, and skin CA (face) recently removed.  Clinical Impression  PTA pt living alone with dog in single story home with 5 steps to enter. Pt reports independence with mobility, ADL and iADLs, as well as driving, fishing in a boat and walking in woods alone with dog. Pt safety is concerning given he also reports bouts of lightheadedness, short term memory loss, and poor understanding of his medical condition. Met patient in hallway after OT session. Pt refuses DME usage and reports "if I get to the point I need that, I hope someone shoots me."  Pt requires hands on min guard for ambulation with 2x min A for LoB due to scissoring. Once pt reached the stairwell he requires standing rest break due to 3/4 DoE, SaO2 on RA 95%O2 HR 95 bpm. In preparation for walking down stairs pt reports feeling lightheaded, "but don't worry it happens all the time." With continued standing break lightheadedness improved and pt required min A and bilateral handrails to descend/ascend 7 steps. After stair training pt requires seated rest break due to 4/4 DoE. With returned to room discussed HHPT to work on balance and pt reluctantly agreeable. PT recommending HHPT for balance and safety training in his home environment, given his weakness and balance PT also recommending 24 hour supervision at discharge. Given pt's deficits both cognitively and physically, he would benefit from transition to ALF but likely will not agree. PT will continue to follow acutely.      Follow Up Recommendations Home health PT;Supervision/Assistance - 24 hour    Equipment Recommendations  None recommended by PT       Precautions /  Restrictions Precautions Precautions: Fall Restrictions Weight Bearing Restrictions: No      Mobility  Bed Mobility              met pt in hallway after working with OT    Transfers Overall transfer level: Needs assistance   Transfers: Sit to/from Stand Sit to Stand: Supervision         General transfer comment: supervision for sit>stand from straight backed chair with arms after seated rest break after stair training   Ambulation/Gait Ambulation/Gait assistance: Min guard;Min assist Gait Distance (Feet): 400 Feet Assistive device: None (refuses) Gait Pattern/deviations: Scissoring;Narrow base of support;Step-through pattern Gait velocity: slowed Gait velocity interpretation: >2.62 ft/sec, indicative of community ambulatory General Gait Details: hands on min guard for safety, with 2x min A to steady due to LoB, slowed, mildly unsteady gait with ocassional scissoring  Stairs Stairs: Yes Stairs assistance: Min assist Stair Management: Two rails;Step to pattern;Forwards Number of Stairs: 7          Balance Overall balance assessment: Needs assistance Sitting-balance support: No upper extremity supported;Feet supported Sitting balance-Leahy Scale: Fair     Standing balance support: No upper extremity supported;During functional activity Standing balance-Leahy Scale: Fair                               Pertinent Vitals/Pain Pain Assessment: 0-10 Pain Score: 7  Pain Location: R flank (Intermittent x4 months) Pain Descriptors / Indicators: Sharp Pain Intervention(s): Limited activity within patient's tolerance;Monitored during session;Repositioned  Home Living Family/patient expects to be discharged to:: Private residence Living Arrangements: Alone Available Help at Discharge: Available PRN/intermittently;Family (daughter assists however states she has medical problems) Type of Home: House Home Access: Stairs to enter Entrance  Stairs-Rails: Can reach both Entrance Stairs-Number of Steps: 5 Home Layout: One level Home Equipment: Walker - 2 wheels;Wheelchair - manual (Has grab bars but does not want to install them) Additional Comments: pt refuses to use DME stating "if I have to use them, I hope someone will shoot me."     Prior Function Level of Independence: Independent               Hand Dominance   Dominant Hand: Right    Extremity/Trunk Assessment   Upper Extremity Assessment Upper Extremity Assessment: Defer to OT evaluation    Lower Extremity Assessment Lower Extremity Assessment: Generalized weakness       Communication   Communication: No difficulties  Cognition Arousal/Alertness: Awake/alert Behavior During Therapy: WFL for tasks assessed/performed Overall Cognitive Status: Impaired/Different from baseline Area of Impairment: Safety/judgement;Awareness;Problem solving;Memory                     Memory: Decreased short-term memory   Safety/Judgement: Decreased awareness of safety;Decreased awareness of deficits Awareness: Emergent Problem Solving: Requires verbal cues;Difficulty sequencing;Requires tactile cues;Slow processing General Comments: pt exhibits short term memory loss with in conversation, has reduced awareness of his safety deficits, reports bouts of lightheadness and memory loss but still drives and does not see problem with that      General Comments General comments (skin integrity, edema, etc.): max HR 95bpm, SaO2 on RA with ambulation 95%O2         Assessment/Plan    PT Assessment Patient needs continued PT services  PT Problem List Decreased activity tolerance;Decreased balance;Decreased mobility;Decreased cognition;Decreased safety awareness;Cardiopulmonary status limiting activity;Pain       PT Treatment Interventions DME instruction;Gait training;Stair training;Functional mobility training;Therapeutic activities;Therapeutic exercise;Balance  training;Cognitive remediation;Patient/family education    PT Goals (Current goals can be found in the Care Plan section)  Acute Rehab PT Goals Patient Stated Goal: get back to fishing and walking in woods with dog PT Goal Formulation: With patient Time For Goal Achievement: 12/29/19 Potential to Achieve Goals: Fair    Frequency Min 3X/week   Barriers to discharge Decreased caregiver support pt has daughter who sees him intermittently however will not be able to provide 24 hour care       AM-PAC PT "6 Clicks" Mobility  Outcome Measure Help needed turning from your back to your side while in a flat bed without using bedrails?: None Help needed moving from lying on your back to sitting on the side of a flat bed without using bedrails?: A Little Help needed moving to and from a bed to a chair (including a wheelchair)?: None Help needed standing up from a chair using your arms (e.g., wheelchair or bedside chair)?: None Help needed to walk in hospital room?: A Little Help needed climbing 3-5 steps with a railing? : A Little 6 Click Score: 21    End of Session Equipment Utilized During Treatment: Gait belt Activity Tolerance: Patient tolerated treatment well Patient left: in chair;with call bell/phone within reach Nurse Communication: Mobility status;Other (comment) (concern with cognition ) PT Visit Diagnosis: Unsteadiness on feet (R26.81);Other abnormalities of gait and mobility (R26.89);Muscle weakness (generalized) (M62.81);Difficulty in walking, not elsewhere classified (R26.2);Pain Pain - Right/Left: Right Pain - part of body: Shoulder (spasm causing jerking motion that affects  balance)    Time: 4332-9518 PT Time Calculation (min) (ACUTE ONLY): 29 min   Charges:   PT Evaluation $PT Eval Moderate Complexity: 1 Mod PT Treatments $Gait Training: 8-22 mins        Tanmay Halteman B. Migdalia Dk PT, DPT Acute Rehabilitation Services Pager [phone] Office [phone]   [place] 12/15/2019, 9:50 AM

## 2019-12-15 NOTE — Discharge Instructions (Signed)
Pleural Effusion Pleural effusion is an abnormal buildup of fluid in the layers of tissue between the lungs and the inside of the chest (pleural space) The two layers of tissue that line the lungs and the inside of the chest are called pleura. Usually, there is no air in the space between the pleura, only a thin layer of fluid. Some conditions can cause a large amount of fluid to build up, which can cause the lung to collapse if untreated. A pleural effusion is usually caused by another disease that requires treatment. What are the causes? Pleural effusion can be caused by:  Heart failure.  Certain infections, such as pneumonia or tuberculosis.  Cancer.  A blood clot in the lung (pulmonary embolism).  Complications from surgery, such as from open heart surgery.  Liver disease (cirrhosis).  Kidney disease. What are the signs or symptoms? In some cases, pleural effusion may cause no symptoms. If symptoms are present, they may include:  Shortness of breath, especially when lying down.  Chest pain. This may get worse when taking a deep breath.  Fever.  Dry, long-lasting (chronic) cough.  Hiccups.  Rapid breathing. An underlying condition that is causing the pleural effusion (such as heart failure, pneumonia, blood clots, tuberculosis, or cancer) may also cause other symptoms. How is this diagnosed? This condition may be diagnosed based on:  Your symptoms and medical history.  A physical exam.  A chest X-ray.  A procedure to use a needle to remove fluid from the pleural space (thoracentesis). This fluid is tested.  Other imaging studies of the chest, such as ultrasound or CT scan. How is this treated? Depending on the cause of your condition, treatment may include:  Treating the underlying condition that is causing the effusion. When that condition improves, the effusion will also improve. Examples of treatment for underlying conditions include: ? Antibiotic medicines to  treat an infection. ? Diuretics or other heart medicines to treat heart failure.  Thoracentesis.  Placing a thin flexible tube under your skin and into your chest to continuously drain the effusion (indwelling pleural catheter).  Surgery to remove the outer layer of tissue from the pleural space (decortication).  A procedure to put medicine into the chest cavity to seal the pleural space and prevent fluid buildup (pleurodesis).  Chemotherapy and radiation therapy, if you have cancerous (malignant) pleural effusion. These treatments are typically used to treat cancer. They kill certain cells in the body. Follow these instructions at home:  Take over-the-counter and prescription medicines only as told by your health care provider.  Ask your health care provider what activities are safe for you.  Keep track of how long you are able to do mild exercise (such as walking) before you get short of breath. Write down this information to share with your health care provider. Your ability to exercise should improve over time.  Do not use any products that contain nicotine or tobacco, such as cigarettes and e-cigarettes. If you need help quitting, ask your health care provider.  Keep all follow-up visits as told by your health care provider. This is important. Contact a health care provider if:  The amount of time that you are able to do mild exercise: ? Decreases. ? Does not improve with time.  You have a fever. Get help right away if:  You are short of breath.  You develop chest pain.  You develop a new cough. Summary  Pleural effusion is an abnormal buildup of fluid in the layers   of tissue between the lungs and the inside of the chest.  Pleural effusion can have many causes, including heart failure, pulmonary embolism, infections, or cancer.  Symptoms of pleural effusion can include shortness of breath, chest pain, fever, long-lasting (chronic) cough, hiccups, or rapid  breathing.  Diagnosis often involves making images of the chest (such as with ultrasound or X-ray) and removing fluid (thoracentesis) to send for testing.  Treatment for pleural effusion depends on what underlying condition is causing it. This information is not intended to replace advice given to you by your health care provider. Make sure you discuss any questions you have with your health care provider. Document Revised: 06/04/2017 Document Reviewed: 02/25/2017 Elsevier Patient Education  2020 [employer]  

## 2019-12-15 NOTE — Telephone Encounter (Signed)
Transition Care Management Follow-up Telephone Call  Date of discharge and from where: 12/15/2019, [name]  How have you been since you were released from the hospital? Patient states that he is doing okay. Glad to be home from the hospital.   Any questions or concerns? No   Items Reviewed:  Did the pt receive and understand the discharge instructions provided? Yes   Medications obtained and verified? Yes   Any new allergies since your discharge? No   Dietary orders reviewed? Yes  Do you have support at home? Yes   Functional Questionnaire: (I = Independent and D = Dependent) ADLs: I  Bathing/Dressing- I  Meal Prep- I  Eating- I  Maintaining continence- I  Transferring/Ambulation- I  Managing Meds- I  Follow up appointments reviewed:   PCP Hospital f/u appt confirmed? Yes  Scheduled to see Dr. Glori [name] on 6/1//2021 @ 12 pm.  [facility] f/u appt confirmed? Yes    Are transportation arrangements needed? No   If their condition worsens, is the pt aware to call PCP or go to the Emergency Dept.? Yes  Was the patient provided with contact information for the PCP's office or ED? Yes  Was to pt encouraged to call back with questions or concerns? Yes

## 2019-12-15 NOTE — Telephone Encounter (Signed)
Correction-his appt with me is 6/18  Aware, thanks

## 2019-12-15 NOTE — Care Management (Signed)
1054 12-15-19 Case Manager received notification that patient needs home health physical and occupational therapies. Case Manager called agencies on the Medicare.gov list and no staffing available. Case Manager spoke with patient and daughter-both agreeable to outpatient therapies. Ambulatory referral sent via Epic. Volunteers to transport downstairs, daughter to transport home. No further needs from Case Manager at this time. Bethena Roys, RN,BSN

## 2019-12-15 NOTE — Plan of Care (Signed)
Pt d/c to home with self care, d/c education given to pt and pts daughter.

## 2019-12-16 LAB — CULTURE, BODY FLUID W GRAM STAIN -BOTTLE

## 2019-12-18 LAB — CULTURE, BLOOD (ROUTINE X 2)
Culture: NO GROWTH
Culture: NO GROWTH
Special Requests: ADEQUATE
Special Requests: ADEQUATE

## 2019-12-18 LAB — CYTOLOGY - NON PAP

## 2019-12-22 ENCOUNTER — Ambulatory Visit (INDEPENDENT_AMBULATORY_CARE_PROVIDER_SITE_OTHER): Payer: Medicare Other | Admitting: Family Medicine

## 2019-12-22 ENCOUNTER — Encounter: Payer: Self-pay | Admitting: Family Medicine

## 2019-12-22 ENCOUNTER — Other Ambulatory Visit: Payer: Self-pay

## 2019-12-22 VITALS — BP 118/72 | HR 63 | Temp 97.6°F | Ht 70.0 in | Wt 185.0 lb

## 2019-12-22 DIAGNOSIS — C44202 Unspecified malignant neoplasm of skin of right ear and external auricular canal: Secondary | ICD-10-CM | POA: Diagnosis not present

## 2019-12-22 DIAGNOSIS — D72829 Elevated white blood cell count, unspecified: Secondary | ICD-10-CM | POA: Diagnosis not present

## 2019-12-22 DIAGNOSIS — J90 Pleural effusion, not elsewhere classified: Secondary | ICD-10-CM | POA: Diagnosis not present

## 2019-12-22 DIAGNOSIS — R1310 Dysphagia, unspecified: Secondary | ICD-10-CM | POA: Diagnosis not present

## 2019-12-22 DIAGNOSIS — I10 Essential (primary) hypertension: Secondary | ICD-10-CM

## 2019-12-22 DIAGNOSIS — K8020 Calculus of gallbladder without cholecystitis without obstruction: Secondary | ICD-10-CM | POA: Insufficient documentation

## 2019-12-22 LAB — CBC WITH DIFFERENTIAL/PLATELET
Basophils Absolute: 0 10*3/uL (ref 0.0–0.1)
Basophils Relative: 0.5 % (ref 0.0–3.0)
Eosinophils Absolute: 0.5 10*3/uL (ref 0.0–0.7)
Eosinophils Relative: 5 % (ref 0.0–5.0)
HCT: 45.4 % (ref 39.0–52.0)
Hemoglobin: 15.3 g/dL (ref 13.0–17.0)
Lymphocytes Relative: 24.5 % (ref 12.0–46.0)
Lymphs Abs: 2.2 10*3/uL (ref 0.7–4.0)
MCHC: 33.7 g/dL (ref 30.0–36.0)
MCV: 93.3 fl (ref 78.0–100.0)
Monocytes Absolute: 0.8 10*3/uL (ref 0.1–1.0)
Monocytes Relative: 8.4 % (ref 3.0–12.0)
Neutro Abs: 5.5 10*3/uL (ref 1.4–7.7)
Neutrophils Relative %: 61.6 % (ref 43.0–77.0)
Platelets: 330 10*3/uL (ref 150.0–400.0)
RBC: 4.87 Mil/uL (ref 4.22–5.81)
RDW: 12.9 % (ref 11.5–15.5)
WBC: 9 10*3/uL (ref 4.0–10.5)

## 2019-12-22 NOTE — Progress Notes (Signed)
Subjective:    Patient ID: Joel [name], male    DOB: 01-03-1936, 84 y.o.   MRN: [id number]  This visit occurred during the SARS-CoV-2 public health emergency.  Safety protocols were in place, including screening questions prior to the visit, additional usage of staff PPE, and extensive cleaning of exam room while observing appropriate contact time as indicated for disinfecting solutions.    HPI Pt presents for f/u of recent hospitalization from 6/9 to 12/15/19 He presented with R flank pain, noting that CT scan at [facility] several weeks ago noted R sided pl effusion   Transition of care call done 6/11   hosp course as follows: In ED CTA showed moderate partially loculated effusion with fluid tracking into minor fissure as well as adj compressive atelectasis  Chest tube was placed and 1400 cc of exudative fluid removed  Started in IV Unasyn  D/c with augmentin  Discharge Diagnoses:  Possible aspiration pneumonia with moderate to large loculated right lung pleural effusion -Underwent thoracocentesis with removal of 1400 cc exudative fluid -Pleural fluid Gram stain with GPC's but culture negative.  -Pleural fluid cytology pending. -Repeat chest x-ray and CT chest with near resolution of pleural effusion. -Started on Unasyn and discharged on p.o. Augmentin for 10 more days. -Outpatient follow-up with pulmonology as above. -Recommended probiotics while on antibiotics.  Concern about dysphagia-evaluated by SLP and had MBS.  Deemed to have mild aspiration risk. -Aspiration precaution discussed.  History of CAD: No anginal symptoms. -Continue home meds  Essential hypertension: Normotensive -Decrease hydrochlorothiazide to 25 mg daily -Added amlodipine 5 mg daily. -Reassess BP and adjust as appropriate at follow-up.  GERD -Continue Prilosec.  Debility -Ambulatory referral to PT  Leukocytosis/bandemia: Likely due to #1.  Improved. -Repeat CBC at follow-up  Lab Results   Component Value Date   WBC 10.9 (H) 12/15/2019   HGB 15.6 12/15/2019   HCT 46.2 12/15/2019   MCV 92.4 12/15/2019   PLT 301 12/15/2019      Chemistry      Component Value Date/Time   NA 138 12/14/2019 0347   K 3.6 12/14/2019 0347   CL 100 12/14/2019 0347   CO2 29 12/14/2019 0347   BUN 13 12/14/2019 0347   CREATININE 0.88 12/14/2019 0347      Component Value Date/Time   CALCIUM 8.9 12/14/2019 0347   ALKPHOS 84 12/13/2019 1130   AST 23 12/13/2019 1130   ALT 22 12/13/2019 1130   BILITOT 0.8 12/13/2019 1130      Swallowing eval report:   Assessment / Plan / Recommendation  CHL IP CLINICAL IMPRESSIONS 12/14/2019  Clinical Impression Pt presents with mild oropharyngeal dysphagia characterized by reduced bolus cohesion, reduced lingual retraction, reduced anterior laryngeal movement, and a pharyngeal delay. He demonstrated trace-mild vallecular and pyriform sinus residue, and penetration (PAS 3) and inconsistent aspiration (PAS 7,8) of thin liquids via straw secondary to premature spillage and pharyngeal delay. A chin tuck posture was attempted to eliminate aspiration, but increased frequency of penetration with thin liquids. It is recommended that the current diet of regular texture solids with thin liquids be continued with observance of swallowing precautions. SLP will continue to follow pt.  SLP Visit Diagnosis Dysphagia, oropharyngeal phase (R13.12)  Attention and concentration deficit following --  Frontal lobe and executive function deficit following --  Impact on safety and function Mild aspiration risk     Radiol: DG Chest 2 View  Result Date: 12/13/2019 CLINICAL DATA:  Shortness of breath and right-sided chest  pain, initial encounter EXAM: CHEST - 2 VIEW COMPARISON:  10/24/2013 FINDINGS: Cardiac shadow is stable. Postsurgical changes are again noted. Left lung is clear. Right-sided effusion with likely underlying atelectasis is noted. No rib abnormality is noted.  Degenerative changes of the thoracic spine are seen. IMPRESSION: Right-sided pleural effusion with likely underlying atelectasis/infiltrate Electronically Signed   By: Inez Catalina M.D.   On: 12/13/2019 11:04   CT CHEST WO CONTRAST  Result Date: 12/14/2019 CLINICAL DATA:  Status post right chest tube placement EXAM: CT CHEST WITHOUT CONTRAST TECHNIQUE: Multidetector CT imaging of the chest was performed following the standard protocol without IV contrast. COMPARISON:  Chest x-ray from earlier in the same day, CT from the previous day FINDINGS: Cardiovascular: Somewhat limited due to lack of IV contrast. Coronary calcifications are seen. Changes of prior coronary bypass grafting are noted. Aortic calcifications are seen. Mediastinum/Nodes: Thoracic inlet is within normal limits. No hilar or mediastinal adenopathy is noted. The esophagus as visualized is unremarkable. Lungs/Pleura: Left lung is well aerated with the exception of minimal basilar atelectasis. Following chest tube placement on the right there has been significant reduction in right-sided pleural effusion with only minimal infiltrate remaining. Some lower lobe consolidation is noted. No pneumothorax is seen. Upper Abdomen: Visualized upper abdomen demonstrates multiple gallstones without complicating factors. Musculoskeletal: No acute bony abnormality is noted. IMPRESSION: Near complete resolution of right-sided pleural effusion following chest tube placement. No pneumothorax is noted. Mild left basilar atelectasis and right lower lobe consolidation. Cholelithiasis without complicating factors. Aortic Atherosclerosis (ICD10-I70.0). Electronically Signed   By: Inez Catalina M.D.   On: 12/14/2019 13:21   CT Angio Chest PE W and/or Wo Contrast  Result Date: 12/13/2019 CLINICAL DATA:  Shortness of breath, pleural effusion, cough. EXAM: CT ANGIOGRAPHY CHEST WITH CONTRAST TECHNIQUE: Multidetector CT imaging of the chest was performed using the standard  protocol during bolus administration of intravenous contrast. Multiplanar CT image reconstructions and MIPs were obtained to evaluate the vascular anatomy. CONTRAST:  75mL OMNIPAQUE IOHEXOL 350 MG/ML SOLN COMPARISON:  Radiograph earlier this day. FINDINGS: Cardiovascular: There are no filling defects within the pulmonary arteries to suggest pulmonary embolus. Aortic atherosclerosis and tortuosity. Cannot assess for dissection given contrast tailored to pulmonary artery evaluation. Post CABG with dense calcification of native coronary arteries. No pericardial effusion. Mediastinum/Nodes: 17 mm right hilar node, series 5, image 46. 12 mm right infrahilar node, series 5, image 56. Small mediastinal nodes not enlarged by size criteria. There is no left hilar adenopathy. No esophageal wall thickening. No thyroid nodule. Lungs/Pleura: Moderately large partially loculated right pleural effusion with fluid tracking into the minor fissure. No definite pleural nodularity or enhancement. Adjacent compressive atelectasis, including majority of the right lower lobe. Small right upper lobe pulmonary nodule measuring 5 mm, series 7, image 33. Punctate subpleural right upper lobe pulmonary nodule, series 7, image 46. There is also a calcified granuloma in the right lower lobe. Trachea and mainstem bronchi are patent. There is bronchial occlusion/filling of basilar segmental bronchus on the right which may be mucous plugging but is nonspecific. There is no left pleural effusion. Tiny faint pulmonary nodule the left lung apex, series 7, image 17. Upper Abdomen: Homogeneous attenuation of the liver and spleen. No acute finding in the included upper abdomen. Musculoskeletal: No acute osseous abnormality or focal bone lesion. Median sternotomy. Review of the MIP images confirms the above findings. IMPRESSION: 1. No pulmonary embolus. 2. Moderately large partially loculated right pleural effusion with fluid tracking into the  minor  fissure. Adjacent compressive atelectasis, including majority of the right lower lobe. No definite pleural nodularity or enhancement. 3. Bronchial occlusion/filling of basilar segmental bronchus on the right which may be mucous plugging but is nonspecific. 4. Enlarged right hilar nodes which are nonspecific. 5. Small bilateral pulmonary nodules, largest measuring 5 mm in the right upper lobe. No dominant pulmonary mass. Given right hilar adenopathy in unilateral pleural effusion, consider pleural fluid sampling. Aortic Atherosclerosis (ICD10-I70.0). Electronically Signed   By: Keith Rake M.D.   On: 12/13/2019 15:43   DG Chest Port 1 View  Result Date: 12/14/2019 CLINICAL DATA:  Chest tube, right-sided chest pain and shortness of breath EXAM: PORTABLE CHEST 1 VIEW COMPARISON:  12/13/2019 FINDINGS: No significant interval change in AP portable chest radiograph with a right-sided pigtail chest tube remaining about the right lung base. Unchanged small right pleural effusion and associated atelectasis or consolidation. Probable trace left pleural effusion. Cardiomegaly status post median sternotomy IMPRESSION: No significant interval change in AP portable chest radiograph with a right-sided pigtail chest tube remaining about the right lung base. Small right pleural effusion and associated atelectasis or consolidation. Electronically Signed   By: Eddie Candle M.D.   On: 12/14/2019 10:20   DG Chest Port 1 View  Result Date: 12/13/2019 CLINICAL DATA:  Chest tube in place EXAM: PORTABLE CHEST 1 VIEW COMPARISON:  12/13/2019 FINDINGS: Sternal wires overlie normal cardiac silhouette. Chest tube at RIGHT lung base. Moderate size RIGHT effusion remains. No pneumothorax. LEFT lung clear IMPRESSION: 1. RIGHT pleural effusion with chest tube at the RIGHT lung base. 2. LEFT lung clear. Electronically Signed   By: Suzy Bouchard M.D.   On: 12/13/2019 18:08   DG Swallowing Func-Speech Pathology  Result Date:  12/14/2019 Objective Swallowing Evaluation: Type of Study: MBS-Modified Barium Swallow Study  Patient Details Name: Joel [name] MRN: [id number] Date of Birth: 11-Sep-1935 Today's Date: 12/14/2019 Time: SLP Start Time (ACUTE ONLY): 1320 -SLP Stop Time (ACUTE ONLY): 1341 SLP Time Calculation (min) (ACUTE ONLY): 21 min Past Medical History: Past Medical History: Diagnosis Date . Arthritis  . Bifascicular block  . Coronary artery disease   Post CABG in 2001 with patent grafts in 2010  (90% mid LAD stenosis with competing flow from LIMA distally, 90% OM stenosis with competing flow from SVG,  90% RCA stenosis with competing flow from SVG, occluded SVG to Posterolateral branch). . Gastritis  . GERD (gastroesophageal reflux disease)  . History of nephrolithiasis  . Hyperlipidemia  . Hypertension  . Partial tear of rotator cuff(726.13)   Right . Psoriasis  Past Surgical History: Past Surgical History: Procedure Laterality Date . BACK SURGERY   . CARDIAC CATHETERIZATION  01/2000  Multi vessel PTCA . CARDIAC CATHETERIZATION  11/2008  multi vesstel dz, re occlusion small vessel . CORONARY ARTERY BYPASS GRAFT  2001 . CT ABD W & PELVIS WO CM  09/2001  Kidney stones . CT ABD WO/W & PELVIS WO CM  10/14/2007  Low grade obstruction on R by 35mm prox ereteral calculus, Sm bilat renal calculi sm HH . Diffuision of unstable angina  03/2000 . ESOPHAGOGASTRODUODENOSCOPY  08/2008  Mild gastritis and GERD . INGUINAL HERNIA REPAIR Right 09/10/2015  Procedure: RIGHT INGUINAL HERNIA REPAIR WITH MESH;  Surgeon: Coralie Keens, MD;  Location: [place];  Service: General;  Laterality: Right; . INSERTION OF MESH Right 09/10/2015  Procedure: INSERTION OF MESH;  Surgeon: Coralie Keens, MD;  Location: [place];  Service: General;  Laterality: Right; . KIDNEY STONE  SURGERY  10/2007  Removal R ureteral kidney stone . NASAL SINUS SURGERY  04/2002 . NECK SURGERY   . Pelvic CT  10/14/2007  nml . PTCA  01/2000  Subendocardial MI . Stress cardiolite  10/2003  No  acute changes . TONSILLECTOMY   HPI: Pt is an 84 y.o. male with medical history significant of CAD; HTN; and HLD presenting with SOB. CXR 6/9: Right-sided pleural effusion with likely underlying atelectasis/infiltrate. CT chest: Moderately large partially loculated right pleural effusion with fluid tracking into the minor fissure. Adjacent compressive atelectasis, including majority of the right lower lobe. No definite pleural nodularity or enhancement.  No data recorded Assessment / Plan / Recommendation CHL IP CLINICAL IMPRESSIONS 12/14/2019 Clinical Impression Pt presents with mild oropharyngeal dysphagia characterized by reduced bolus cohesion, reduced lingual retraction, reduced anterior laryngeal movement, and a pharyngeal delay. He demonstrated trace-mild vallecular and pyriform sinus residue, and penetration (PAS 3) and inconsistent aspiration (PAS 7,8) of thin liquids via straw secondary to premature spillage and pharyngeal delay. A chin tuck posture was attempted to eliminate aspiration, but increased frequency of penetration with thin liquids. It is recommended that the current diet of regular texture solids with thin liquids be continued with observance of swallowing precautions. SLP will continue to follow pt. SLP Visit Diagnosis Dysphagia, oropharyngeal phase (R13.12) Attention and concentration deficit following -- Frontal lobe and executive function deficit following -- Impact on safety and function Mild aspiration risk   CHL IP TREATMENT RECOMMENDATION 12/14/2019 Treatment Recommendations Therapy as outlined in treatment plan below   Prognosis 12/14/2019 Prognosis for Safe Diet Advancement Good Barriers to Reach Goals -- Barriers/Prognosis Comment -- CHL IP DIET RECOMMENDATION 12/14/2019 SLP Diet Recommendations Regular solids;Thin liquid Liquid Administration via Cup;No straw Medication Administration Whole meds with liquid Compensations Slow rate;Small sips/bites Postural Changes Seated upright at 90  degrees   CHL IP OTHER RECOMMENDATIONS 12/14/2019 Recommended Consults -- Oral Care Recommendations Oral care BID;Patient independent with oral care Other Recommendations --   CHL IP FOLLOW UP RECOMMENDATIONS 12/14/2019 Follow up Recommendations Outpatient SLP   CHL IP FREQUENCY AND DURATION 12/14/2019 Speech Therapy Frequency (ACUTE ONLY) min 2x/week Treatment Duration 2 weeks      CHL IP ORAL PHASE 12/14/2019 Oral Phase Impaired Oral - Pudding Teaspoon -- Oral - Pudding Cup -- Oral - Honey Teaspoon -- Oral - Honey Cup -- Oral - Nectar Teaspoon -- Oral - Nectar Cup -- Oral - Nectar Straw -- Oral - Thin Teaspoon -- Oral - Thin Cup Decreased bolus cohesion;Premature spillage Oral - Thin Straw Decreased bolus cohesion;Premature spillage Oral - Puree -- Oral - Mech Soft -- Oral - Regular -- Oral - Multi-Consistency -- Oral - Pill -- Oral Phase - Comment --  CHL IP PHARYNGEAL PHASE 12/14/2019 Pharyngeal Phase Impaired Pharyngeal- Pudding Teaspoon -- Pharyngeal -- Pharyngeal- Pudding Cup -- Pharyngeal -- Pharyngeal- Honey Teaspoon -- Pharyngeal -- Pharyngeal- Honey Cup -- Pharyngeal -- Pharyngeal- Nectar Teaspoon -- Pharyngeal -- Pharyngeal- Nectar Cup -- Pharyngeal -- Pharyngeal- Nectar Straw -- Pharyngeal -- Pharyngeal- Thin Teaspoon -- Pharyngeal -- Pharyngeal- Thin Cup Reduced anterior laryngeal mobility;Pharyngeal residue - valleculae;Reduced tongue base retraction;Pharyngeal residue - pyriform Pharyngeal Material does not enter airway Pharyngeal- Thin Straw Penetration/Aspiration before swallow;Penetration/Aspiration during swallow;Reduced anterior laryngeal mobility;Pharyngeal residue - valleculae;Reduced tongue base retraction Pharyngeal Material enters airway, remains ABOVE vocal cords and not ejected out;Material enters airway, passes BELOW cords and not ejected out despite cough attempt by patient;Material enters airway, passes BELOW cords without attempt by patient to eject out (  silent aspiration) Pharyngeal-  Puree Reduced anterior laryngeal mobility;Pharyngeal residue - valleculae;Reduced tongue base retraction;Pharyngeal residue - pyriform Pharyngeal Material does not enter airway Pharyngeal- Mechanical Soft -- Pharyngeal -- Pharyngeal- Regular Reduced anterior laryngeal mobility;Pharyngeal residue - valleculae;Reduced tongue base retraction;Pharyngeal residue - pyriform Pharyngeal -- Pharyngeal- Multi-consistency -- Pharyngeal -- Pharyngeal- Pill Reduced anterior laryngeal mobility;Pharyngeal residue - valleculae;Reduced tongue base retraction;Pharyngeal residue - pyriform Pharyngeal -- Pharyngeal Comment --  CHL IP CERVICAL ESOPHAGEAL PHASE 12/14/2019 Cervical Esophageal Phase WFL Pudding Teaspoon -- Pudding Cup -- Honey Teaspoon -- Honey Cup -- Nectar Teaspoon -- Nectar Cup -- Nectar Straw -- Thin Teaspoon -- Thin Cup -- Thin Straw -- Puree -- Mechanical Soft -- Regular -- Multi-consistency -- Pill -- Cervical Esophageal Comment -- Joel [name], [place], [place] Office number [phone] Pager [phone] Horton Marshall 12/14/2019, 2:32 PM               Wt Readings from Last 3 Encounters:  12/22/19 185 lb (83.9 kg)  12/15/19 176 lb 14.4 oz (80.2 kg)  03/15/19 183 lb 8 oz (83.2 kg)   26.54 kg/m  Noted: former smoker  75 pack years   Has pulmonary appt 6/30   His pain is much better  Hurts a little when he sneezes  Sore over R mid back  He can take a deep breath  Pulse ox today is 94% on RA  We discussed gallstones seen on CT occ pain she eats   Formerly seen at [place] (c/o flank pain)   Has also had recent skin cancer on R ear  ? Pre melanoma  Has had lots of places treated   BP Readings from Last 3 Encounters:  12/22/19 118/72  12/15/19 133/69  03/15/19 136/80    Patient Active Problem List   Diagnosis Date Noted  . Gallstones 12/22/2019  . Cancer of skin of right ear 12/22/2019  . Elevated WBC count 12/22/2019  . Dysphagia 12/22/2019  .  Pleural effusion 12/14/2019  . Loculated pleural effusion 12/13/2019  . Dizziness 08/30/2018  . Routine general medical examination at a health care facility 11/09/2016  . Hernia, inguinal 06/06/2015  . History of fall 11/09/2014  . Encounter for Medicare annual wellness exam 11/06/2013  . Prostate cancer screening 05/09/2013  . Prediabetes 07/31/2011  . HTN (hypertension) 06/16/2011  . Night muscle spasms 01/21/2011  . Low back pain 01/16/2010  . CAD, AUTOLOGOUS BYPASS GRAFT 11/07/2008  . FLATULENCE-GAS-BLOATING 08/02/2008  . URINARY CALCULUS 10/17/2007  . Coronary atherosclerosis 10/03/2007  . Hyperlipidemia 04/13/2007  . Hearing loss 04/13/2007  . RHINITIS, CHRONIC 04/13/2007  . GERD 04/13/2007  . Actinic keratosis 04/13/2007  . NEPHROLITHIASIS, HX OF 04/13/2007   Past Medical History:  Diagnosis Date  . Arthritis   . Bifascicular block   . Coronary artery disease    Post CABG in 2001 with patent grafts in 2010  (90% mid LAD stenosis with competing flow from LIMA distally, 90% OM stenosis with competing flow from SVG,  90% RCA stenosis with competing flow from SVG, occluded SVG to Posterolateral branch).  . Gastritis   . GERD (gastroesophageal reflux disease)   . History of nephrolithiasis   . Hyperlipidemia   . Hypertension   . Partial tear of rotator cuff(726.13)    Right  . Psoriasis    Past Surgical History:  Procedure Laterality Date  . BACK SURGERY    . CARDIAC CATHETERIZATION  01/2000   Multi vessel PTCA  . CARDIAC CATHETERIZATION  11/2008  multi vesstel dz, re occlusion small vessel  . CORONARY ARTERY BYPASS GRAFT  2001  . CT ABD W & PELVIS WO CM  09/2001   Kidney stones  . CT ABD WO/W & PELVIS WO CM  10/14/2007   Low grade obstruction on R by 14mm prox ereteral calculus, Sm bilat renal calculi sm HH  . Diffuision of unstable angina  03/2000  . ESOPHAGOGASTRODUODENOSCOPY  08/2008   Mild gastritis and GERD  . INGUINAL HERNIA REPAIR Right 09/10/2015    Procedure: RIGHT INGUINAL HERNIA REPAIR WITH MESH;  Surgeon: Coralie Keens, MD;  Location: [place];  Service: General;  Laterality: Right;  . INSERTION OF MESH Right 09/10/2015   Procedure: INSERTION OF MESH;  Surgeon: Coralie Keens, MD;  Location: [place];  Service: General;  Laterality: Right;  . KIDNEY STONE SURGERY  10/2007   Removal R ureteral kidney stone  . NASAL SINUS SURGERY  04/2002  . NECK SURGERY    . Pelvic CT  10/14/2007   nml  . PTCA  01/2000   Subendocardial MI  . Stress cardiolite  10/2003   No acute changes  . TONSILLECTOMY     Social History   Tobacco Use  . Smoking status: Former Smoker    Packs/day: 1.50    Years: 50.00    Pack years: 75.00    Quit date: 12/05/1999    Years since quitting: 20.0  . Smokeless tobacco: Never Used  Vaping Use  . Vaping Use: Never used  Substance Use Topics  . Alcohol use: No    Alcohol/week: 0.0 standard drinks  . Drug use: No   Family History  Problem Relation Age of Onset  . Transient ischemic attack Mother   . Coronary artery disease Mother   . Heart disease Mother        CAD  . Alcohol abuse Father   . Diabetes type II Father   . Diabetes Father   . Heart disease Father        pacemaker   No Known Allergies Current Outpatient Medications on File Prior to Visit  Medication Sig Dispense Refill  . amLODipine (NORVASC) 5 MG tablet Take 1 tablet (5 mg total) by mouth daily. 30 tablet 1  . amoxicillin-clavulanate (AUGMENTIN) 875-125 MG tablet Take 1 tablet by mouth 2 (two) times daily for 14 days. 28 tablet 0  . aspirin EC 81 MG tablet Take 1 tablet (81 mg total) by mouth daily. 90 tablet 3  . hydrochlorothiazide (HYDRODIURIL) 25 MG tablet Take 1 tablet (25 mg total) by mouth daily. 90 tablet 1  . Lactobacillus (PROBIOTIC ACIDOPHILUS) CAPS Take 1 capsule by mouth in the morning and at bedtime. 30 capsule 0  . latanoprost (XALATAN) 0.005 % ophthalmic solution Place 1 drop into both eyes at bedtime.    . Multiple  Vitamin (MULTIVITAMIN) tablet Take 1 tablet by mouth daily.      . Omega-3 Fatty Acids (FISH OIL) 1000 MG CAPS Take 1 capsule by mouth daily.    Marland Kitchen omeprazole (PRILOSEC) 20 MG capsule Take 1 capsule (20 mg total) by mouth daily as needed. For acid reflux (Patient taking differently: Take 20 mg by mouth daily. For acid reflux) 90 capsule 3   No current facility-administered medications on file prior to visit.    Review of Systems  Constitutional: Positive for fatigue. Negative for activity change, appetite change, fever and unexpected weight change.  HENT: Positive for hearing loss. Negative for congestion, rhinorrhea, sore throat and trouble swallowing.  Eyes: Negative for pain, redness, itching and visual disturbance.  Respiratory: Negative for cough, chest tightness, shortness of breath and wheezing.   Cardiovascular: Positive for chest pain. Negative for palpitations.       Some chest wall pain on L with big breaths  Some at prior chest tube site   Gastrointestinal: Negative for abdominal pain, blood in stool, constipation, diarrhea and nausea.       Occ stomach upset after eating  Not today  Endocrine: Negative for cold intolerance, heat intolerance, polydipsia and polyuria.  Genitourinary: Negative for difficulty urinating, dysuria, frequency and urgency.  Musculoskeletal: Negative for arthralgias, joint swelling and myalgias.  Skin: Negative for pallor and rash.  Neurological: Positive for dizziness. Negative for tremors, weakness, numbness and headaches.       Some chronic dizziness  Hematological: Negative for adenopathy. Does not bruise/bleed easily.  Psychiatric/Behavioral: Negative for decreased concentration and dysphoric mood. The patient is not nervous/anxious.        Objective:   Physical Exam Constitutional:      General: He is not in acute distress.    Appearance: Normal appearance. He is well-developed and normal weight. He is not ill-appearing or diaphoretic.   HENT:     Head: Normocephalic and atraumatic.     Mouth/Throat:     Mouth: Mucous membranes are moist.  Eyes:     General: No scleral icterus.    Conjunctiva/sclera: Conjunctivae normal.     Pupils: Pupils are equal, round, and reactive to light.  Neck:     Thyroid: No thyromegaly.     Vascular: No carotid bruit or JVD.  Cardiovascular:     Rate and Rhythm: Normal rate and regular rhythm.     Heart sounds: Normal heart sounds. No gallop.   Pulmonary:     Effort: Pulmonary effort is normal. No respiratory distress.     Breath sounds: Normal breath sounds. No stridor. No wheezing, rhonchi or rales.     Comments: R anterolateral chest tenderness Healing chest tube site (wound is entirely closed) Chest:     Chest wall: Tenderness present.  Abdominal:     General: Bowel sounds are normal. There is no distension or abdominal bruit.     Palpations: Abdomen is soft. There is no mass.     Tenderness: There is no abdominal tenderness.  Musculoskeletal:     Cervical back: Normal range of motion and neck supple. No tenderness.     Right lower leg: No edema.     Left lower leg: No edema.  Lymphadenopathy:     Cervical: No cervical adenopathy.  Skin:    General: Skin is warm and dry.     Coloration: Skin is not pale.     Findings: No erythema or rash.     Comments: Large bandage on R ear  Neurological:     Mental Status: He is alert.     Sensory: No sensory deficit.     Coordination: Coordination normal.     Deep Tendon Reflexes: Reflexes are normal and symmetric. Reflexes normal.  Psychiatric:        Mood and Affect: Mood normal.        Speech: Speech is tangential.        Cognition and Memory: Cognition normal.     Comments: Tangential in speech Poor hearing-talks very loudly  Pleasant            Assessment & Plan:   Problem List Items Addressed This Visit  Cardiovascular and Mediastinum   HTN (hypertension)    bp is stable after hospitalization  bp in fair  control at this time  BP Readings from Last 1 Encounters:  12/22/19 118/72   No changes needed Most recent labs reviewed  Disc lifstyle change with low sodium diet and exercise          Respiratory   Pleural effusion - Primary    Recent hosp with chest tube  Reviewed hospital records, lab results and studies in detail  Flank pain is much improved after drainage rec studies and cytology, will f/u with pulmonary to have more questions answered Finishing abx  Reassuring exam  Pulse ox 94% RA former smoker-not sob at all          Digestive   Gallstones    These were seen incidentally on his CT Unsure if symptomatic /his flank pain is now improved after drainage of pleural effusion  Will watch  Low threshold to consult surgeon if symptomatic        Dysphagia    Oropharyngeal  Reviewed swallowing eval and recommendations  Recommended chin tuck posture to prevent aspiration and avoidance of straw  Will obsv closely  Unsure of etiology or if this could be related to pleural eff        Nervous and Auditory   Cancer of skin of right ear    Currently under tx  Unsure what kind (not melanoma) Bandaged today        Other   Elevated WBC count    In hospital at time of chest tube/pleural effusion Reviewed hospital records, lab results and studies in detail   Repeat cbc today  Is taking abx (augmentin)       Relevant Orders   CBC with Differential/Platelet (Completed)

## 2019-12-22 NOTE — Patient Instructions (Addendum)
You have food related symptoms (right sided abdominal pain /nausea) let me know - this may be from gallstones  I think your pain now is from the effusion /chest tube  You are making good improvement   See the pulmonary doctor as planned  Finish the antibiotics    I need to re check your cbc (blood count) today   Alert [place] if symptoms change   Watch your swallowing and try to follow the directions they gave you in the hospital

## 2019-12-23 NOTE — Assessment & Plan Note (Signed)
Oropharyngeal  Reviewed swallowing eval and recommendations  Recommended chin tuck posture to prevent aspiration and avoidance of straw  Will obsv closely  Unsure of etiology or if this could be related to pleural eff

## 2019-12-23 NOTE — Assessment & Plan Note (Signed)
In hospital at time of chest tube/pleural effusion Reviewed hospital records, lab results and studies in detail   Repeat cbc today  Is taking abx (augmentin)

## 2019-12-23 NOTE — Assessment & Plan Note (Signed)
These were seen incidentally on his CT Unsure if symptomatic /his flank pain is now improved after drainage of pleural effusion  Will watch  Low threshold to consult surgeon if symptomatic

## 2019-12-23 NOTE — Assessment & Plan Note (Signed)
Recent hosp with chest tube  Reviewed hospital records, lab results and studies in detail  Flank pain is much improved after drainage rec studies and cytology, will f/u with pulmonary to have more questions answered Finishing abx  Reassuring exam  Pulse ox 94% RA former smoker-not sob at all

## 2019-12-23 NOTE — Assessment & Plan Note (Signed)
Currently under tx  Unsure what kind (not melanoma) Bandaged today

## 2019-12-23 NOTE — Assessment & Plan Note (Signed)
bp is stable after hospitalization  bp in fair control at this time  BP Readings from Last 1 Encounters:  12/22/19 118/72   No changes needed Most recent labs reviewed  Disc lifstyle change with low sodium diet and exercise

## 2019-12-25 ENCOUNTER — Telehealth: Payer: Self-pay | Admitting: *Deleted

## 2019-12-25 NOTE — Telephone Encounter (Signed)
Left VM requesting pt to call the office back regarding lab results  

## 2019-12-27 ENCOUNTER — Telehealth: Payer: Self-pay | Admitting: *Deleted

## 2019-12-27 NOTE — Telephone Encounter (Signed)
Pt came to office and addressed directly with him

## 2019-12-27 NOTE — Telephone Encounter (Signed)
Error

## 2020-01-03 ENCOUNTER — Ambulatory Visit (INDEPENDENT_AMBULATORY_CARE_PROVIDER_SITE_OTHER): Payer: Medicare Other | Admitting: Pulmonary Disease

## 2020-01-03 ENCOUNTER — Other Ambulatory Visit: Payer: Self-pay

## 2020-01-03 ENCOUNTER — Encounter: Payer: Self-pay | Admitting: Pulmonary Disease

## 2020-01-03 ENCOUNTER — Ambulatory Visit (INDEPENDENT_AMBULATORY_CARE_PROVIDER_SITE_OTHER): Payer: Medicare Other

## 2020-01-03 ENCOUNTER — Other Ambulatory Visit (HOSPITAL_COMMUNITY)
Admission: RE | Admit: 2020-01-03 | Discharge: 2020-01-03 | Disposition: A | Discharge status: Home / Self Care | Payer: Medicare Other | Source: Ambulatory Visit | Attending: Internal Medicine | Admitting: Internal Medicine

## 2020-01-03 ENCOUNTER — Telehealth: Payer: Self-pay | Admitting: Pulmonary Disease

## 2020-01-03 VITALS — BP 130/82 | HR 84 | Temp 98.2°F | Ht 70.0 in | Wt 183.0 lb

## 2020-01-03 DIAGNOSIS — Z20822 Contact with and (suspected) exposure to covid-19: Secondary | ICD-10-CM | POA: Insufficient documentation

## 2020-01-03 DIAGNOSIS — J90 Pleural effusion, not elsewhere classified: Secondary | ICD-10-CM

## 2020-01-03 DIAGNOSIS — J9811 Atelectasis: Secondary | ICD-10-CM | POA: Diagnosis not present

## 2020-01-03 DIAGNOSIS — Z01812 Encounter for preprocedural laboratory examination: Secondary | ICD-10-CM | POA: Diagnosis not present

## 2020-01-03 LAB — SARS CORONAVIRUS 2 (TAT 6-24 HRS): SARS Coronavirus 2: NEGATIVE

## 2020-01-03 NOTE — Assessment & Plan Note (Signed)
Patient with increased pleural pain Increased dyspnea on exertion Chest x-ray completed in office today shows right pleural effusion Patient felt symptomatic relief with thoracentesis earlier this month Thoracentesis earlier this month revealed an exudative effusion, eosinophilic, cytology shows reactive mesothelial cells  Discussion: Discussed case with Dr. Tamala Julian over the phone.  Given the pleurisy, and return of pleural fluid will prepare for second outpatient thoracentesis.  May need to consider Pleurx catheter in the future.  With the eosinophilia and reactive mesothelial cells evaluated CT chest no appreciable pleural thickening for concern of biopsy.  We will continue to clinically monitor.  We will test pleural fluid again at thoracentesis tomorrow with Marni Griffon, NP.  Plan: We will prepare for outpatient thoracentesis tomorrow with Marni Griffon, NP Hold daily baby aspirin We will need follow-up with our office in 2 weeks with chest x-ray

## 2020-01-03 NOTE — Telephone Encounter (Signed)
01/03/2020  Please ensure the patient has a 30-minute new patient appointment with either Dr. Valeta Harms or Dr. Carlis Abbott sometime over the next 4 to 8 weeks.  [name], FNP

## 2020-01-03 NOTE — Patient Instructions (Addendum)
You were seen today by Lauraine Rinne, NP  for:   1. Pleural effusion  - DG Chest 2 View; Future  2. Loculated pleural effusion  - DG Chest 2 View; Future   Looks like the pleural effusion that you have has returned.  I will discuss the case with Dr. Tamala Julian.  I believe we should proceed forward with a repeat thoracentesis.  This will likely be done outpatient in the hospital with one of our providers.  We will do labs on this thoracentesis as well.  If these continue to persist we will need to place a Pleurx catheter  I discussed the mesothelial reactive cells, Dr. Tamala Julian does not believe that these are concerning I do not believe that we need to do additional work-up at this time regarding this  We will have to have close follow-up with our office over the next 2 weeks   We recommend today:  Orders Placed This Encounter  Procedures  . DG Chest 2 View    Standing Status:   Future    Number of Occurrences:   1    Standing Expiration Date:   05/04/2020    Order Specific Question:   Reason for Exam (SYMPTOM  OR DIAGNOSIS REQUIRED)    Answer:   right pleural effusion    Order Specific Question:   Preferred imaging location?    Answer:   Internal    Order Specific Question:   Radiology Contrast Protocol - do NOT remove file path    Answer:   [email or web address]   Orders Placed This Encounter  Procedures  . DG Chest 2 View   No orders of the defined types were placed in this encounter.   Follow Up:    Return in about 2 weeks (around 01/17/2020), or if symptoms worsen or fail to improve, for Follow up with [name] FNP-C.   Please do your part to reduce the spread of COVID-19:      Reduce your risk of any infection  and COVID19 by using the similar precautions used for avoiding the common cold or flu:  Marland Kitchen Wash your hands often with soap and warm water for at least 20 seconds.  If soap and water are not readily available, use an  alcohol-based hand sanitizer with at least 60% alcohol.  . If coughing or sneezing, cover your mouth and nose by coughing or sneezing into the elbow areas of your shirt or coat, into a tissue or into your sleeve (not your hands). Langley Gauss A MASK when in public  . Avoid shaking hands with others and consider head nods or verbal greetings only. . Avoid touching your eyes, nose, or mouth with unwashed hands.  . Avoid close contact with people who are sick. . Avoid places or events with large numbers of people in one location, like concerts or sporting events. . If you have some symptoms but not all symptoms, continue to monitor at home and seek medical attention if your symptoms worsen. . If you are having a medical emergency, call 911.   [place] / e-Visit: [email or web address]         [facility]: [phone]  [facility]: [phone]                   [facility]: [phone]     It is flu season:   >>> Best ways to protect herself from the flu: Receive  the yearly flu vaccine, practice good hand hygiene washing with soap and also using hand sanitizer when available, eat a nutritious meals, get adequate rest, hydrate appropriately   Please contact the office if your symptoms worsen or you have concerns that you are not improving.   Thank you for choosing [facility] for your healthcare, and for allowing [place] to partner with you on your healthcare journey. I am thankful to be able to provide care to you today.   [name] FNP-C

## 2020-01-03 NOTE — Progress Notes (Signed)
@Patient  ID: Joel [name], male    DOB: 06/04/1936, 84 y.o.   MRN: [id number]  Chief Complaint  Patient presents with  . Hospitalization Follow-up    DOE and some at rest, occasional productive cough, clear sputum to light green    Referring provider: Tower, Wynelle Fanny, MD  HPI:  84 year old male former smoker followed in our office for pleural effusion  PMH: Hyperlipidemia, GERD, hypertension, prediabetes, dysphagia Smoker/ Smoking History: Former smoker Maintenance: None Pt of: Needs outpatient pulmonologist  01/03/2020  - Visit   84 year old male former smoker followed in our office for pleural effusions.  Patient was consulted with our practice while hospitalized by Dr. Lake Bells.  He was consulted on 12/13/2019 with a loculated pleural effusion seen on the CT scan of his chest.  Patient had a right-sided pigtail pleural drainage catheter placed on 12/13/2019.  Patient was reevaluated on 12/14/2019 showed a near complete removal of pleural effusion right pigtail chest tube removed at bedside with no complications.  Patient set up for pulmonary outpatient follow-up.  An excerpt of the discharge summary is listed below:  Possible aspiration pneumonia with moderate to large loculated right lung pleural effusion -Underwent thoracocentesis with removal of 1400 cc exudative fluid -Pleural fluid Gram stain with GPC's but culture negative.  -Pleural fluid cytology pending. -Repeat chest x-ray and CT chest with near resolution of pleural effusion. -Started on Unasyn and discharged on p.o. Augmentin for 10 more days. -Outpatient follow-up with pulmonology as above. -Recommended probiotics while on antibiotics.  Concern about dysphagia-evaluated by SLP and had MBS.  Deemed to have mild aspiration risk. -Aspiration precaution discussed.  History of CAD: No anginal symptoms. -Continue home meds  Essential hypertension: Normotensive -Decrease hydrochlorothiazide to 25 mg daily -Added  amlodipine 5 mg daily. -Reassess BP and adjust as appropriate at follow-up.  GERD -Continue Prilosec.  Debility -Ambulatory referral to PT  Leukocytosis/bandemia: Likely due to #1.  Improved. -Repeat CBC at follow-up  While inpatient patient did have a swallow study those results are listed below:   Pt presents with mild oropharyngeal dysphagia characterized by reduced bolus cohesion, reduced lingual retraction, reduced anterior laryngeal movement, and a pharyngeal delay. He demonstrated trace-mild vallecular and pyriform sinus residue, and penetration (PAS 3) and inconsistent aspiration (PAS 7,8) of thin liquids via straw secondary to premature spillage and pharyngeal delay. A chin tuck posture was attempted to eliminate aspiration, but increased frequency of penetration with thin liquids. It is recommended that the current diet of regular texture solids with thin liquids be continued with observance of swallowing precautions. SLP will continue to follow pt.  SLP Visit Diagnosis Dysphagia, oropharyngeal phase (R13.12)  Attention and concentration deficit following --  Frontal lobe and executive function deficit following --  Impact on safety and function Mild aspiration risk    Patient continues to follow-up with [facility] dermatology who continue to do biopsies which were positive for mesothelioma.  Patient has an upcoming biopsy tomorrow on his lip.  Patient is also followed by the [place].  They report that after being discharged from the hospital 4 days later he had a repeat chest x-ray which showed a persistent effusion.  Patient reports that he continues to have pleuritic pain that is worsening.  Also increased shortness of breath.  He did feel therapeutic relief with the thoracentesis upon hospitalization.  Patient presenting to our office today with his daughter.  They would like to review the pleural fluid test results as well as cytology.  We will  discuss this today those results  are listed below:  12/13/2019-pleural fluid culture-positive for Staphylococcus epidermis, resistant to erythromycin myosin 12/13/2019-cytology-reactive mesothelial cells present 12/13/2019-amber color, appearance hazy, eos 65  Repeat chest x-ray in our office today results are listed below:  01/03/2020-chest x-ray-interval removal of catheter from right base, no appreciable pneumothorax, small loculated right pleural effusion, bibasilar atelectasis, no consolidation, heart size within normal limits  Questionaires / Pulmonary Flowsheets:   ACT:  No flowsheet data found.  MMRC: No flowsheet data found.  Epworth:  No flowsheet data found.  Tests:   FENO:  No results found for: NITRICOXIDE  PFT: No flowsheet data found.  WALK:  No flowsheet data found.  Imaging: DG Chest 2 View  Result Date: 01/03/2020 CLINICAL DATA:  Recent right pleural effusion EXAM: CHEST - 2 VIEW COMPARISON:  December 14, 2019 chest radiograph and chest CT. FINDINGS: Pigtail catheter has been removed from the right side. Currently there is a small apparently partially loculated right pleural effusion with bibasilar atelectasis. No pneumothorax. Lungs elsewhere clear. Heart size and pulmonary vascularity are normal. Patient is status post coronary artery bypass grafting. No adenopathy. There is degenerative change in the midthoracic region. IMPRESSION: Interval removal of catheter from right base. No appreciable pneumothorax. Small loculated right pleural effusion. Bibasilar atelectasis. No consolidation. Heart size within normal limits. Status post coronary artery bypass grafting. Electronically Signed   By: Lowella Grip III M.D.   On: 01/03/2020 12:27   DG Chest 2 View  Result Date: 12/13/2019 CLINICAL DATA:  Shortness of breath and right-sided chest pain, initial encounter EXAM: CHEST - 2 VIEW COMPARISON:  10/24/2013 FINDINGS: Cardiac shadow is stable. Postsurgical changes are again noted. Left lung is clear.  Right-sided effusion with likely underlying atelectasis is noted. No rib abnormality is noted. Degenerative changes of the thoracic spine are seen. IMPRESSION: Right-sided pleural effusion with likely underlying atelectasis/infiltrate Electronically Signed   By: Inez Catalina M.D.   On: 12/13/2019 11:04   CT CHEST WO CONTRAST  Result Date: 12/14/2019 CLINICAL DATA:  Status post right chest tube placement EXAM: CT CHEST WITHOUT CONTRAST TECHNIQUE: Multidetector CT imaging of the chest was performed following the standard protocol without IV contrast. COMPARISON:  Chest x-ray from earlier in the same day, CT from the previous day FINDINGS: Cardiovascular: Somewhat limited due to lack of IV contrast. Coronary calcifications are seen. Changes of prior coronary bypass grafting are noted. Aortic calcifications are seen. Mediastinum/Nodes: Thoracic inlet is within normal limits. No hilar or mediastinal adenopathy is noted. The esophagus as visualized is unremarkable. Lungs/Pleura: Left lung is well aerated with the exception of minimal basilar atelectasis. Following chest tube placement on the right there has been significant reduction in right-sided pleural effusion with only minimal infiltrate remaining. Some lower lobe consolidation is noted. No pneumothorax is seen. Upper Abdomen: Visualized upper abdomen demonstrates multiple gallstones without complicating factors. Musculoskeletal: No acute bony abnormality is noted. IMPRESSION: Near complete resolution of right-sided pleural effusion following chest tube placement. No pneumothorax is noted. Mild left basilar atelectasis and right lower lobe consolidation. Cholelithiasis without complicating factors. Aortic Atherosclerosis (ICD10-I70.0). Electronically Signed   By: Inez Catalina M.D.   On: 12/14/2019 13:21   CT Angio Chest PE W and/or Wo Contrast  Result Date: 12/13/2019 CLINICAL DATA:  Shortness of breath, pleural effusion, cough. EXAM: CT ANGIOGRAPHY CHEST  WITH CONTRAST TECHNIQUE: Multidetector CT imaging of the chest was performed using the standard protocol during bolus administration of intravenous contrast. Multiplanar CT image reconstructions  and MIPs were obtained to evaluate the vascular anatomy. CONTRAST:  22mL OMNIPAQUE IOHEXOL 350 MG/ML SOLN COMPARISON:  Radiograph earlier this day. FINDINGS: Cardiovascular: There are no filling defects within the pulmonary arteries to suggest pulmonary embolus. Aortic atherosclerosis and tortuosity. Cannot assess for dissection given contrast tailored to pulmonary artery evaluation. Post CABG with dense calcification of native coronary arteries. No pericardial effusion. Mediastinum/Nodes: 17 mm right hilar node, series 5, image 46. 12 mm right infrahilar node, series 5, image 56. Small mediastinal nodes not enlarged by size criteria. There is no left hilar adenopathy. No esophageal wall thickening. No thyroid nodule. Lungs/Pleura: Moderately large partially loculated right pleural effusion with fluid tracking into the minor fissure. No definite pleural nodularity or enhancement. Adjacent compressive atelectasis, including majority of the right lower lobe. Small right upper lobe pulmonary nodule measuring 5 mm, series 7, image 33. Punctate subpleural right upper lobe pulmonary nodule, series 7, image 46. There is also a calcified granuloma in the right lower lobe. Trachea and mainstem bronchi are patent. There is bronchial occlusion/filling of basilar segmental bronchus on the right which may be mucous plugging but is nonspecific. There is no left pleural effusion. Tiny faint pulmonary nodule the left lung apex, series 7, image 17. Upper Abdomen: Homogeneous attenuation of the liver and spleen. No acute finding in the included upper abdomen. Musculoskeletal: No acute osseous abnormality or focal bone lesion. Median sternotomy. Review of the MIP images confirms the above findings. IMPRESSION: 1. No pulmonary embolus. 2.  Moderately large partially loculated right pleural effusion with fluid tracking into the minor fissure. Adjacent compressive atelectasis, including majority of the right lower lobe. No definite pleural nodularity or enhancement. 3. Bronchial occlusion/filling of basilar segmental bronchus on the right which may be mucous plugging but is nonspecific. 4. Enlarged right hilar nodes which are nonspecific. 5. Small bilateral pulmonary nodules, largest measuring 5 mm in the right upper lobe. No dominant pulmonary mass. Given right hilar adenopathy in unilateral pleural effusion, consider pleural fluid sampling. Aortic Atherosclerosis (ICD10-I70.0). Electronically Signed   By: Keith Rake M.D.   On: 12/13/2019 15:43   DG Chest Port 1 View  Result Date: 12/14/2019 CLINICAL DATA:  Chest tube, right-sided chest pain and shortness of breath EXAM: PORTABLE CHEST 1 VIEW COMPARISON:  12/13/2019 FINDINGS: No significant interval change in AP portable chest radiograph with a right-sided pigtail chest tube remaining about the right lung base. Unchanged small right pleural effusion and associated atelectasis or consolidation. Probable trace left pleural effusion. Cardiomegaly status post median sternotomy IMPRESSION: No significant interval change in AP portable chest radiograph with a right-sided pigtail chest tube remaining about the right lung base. Small right pleural effusion and associated atelectasis or consolidation. Electronically Signed   By: Eddie Candle M.D.   On: 12/14/2019 10:20   DG Chest Port 1 View  Result Date: 12/13/2019 CLINICAL DATA:  Chest tube in place EXAM: PORTABLE CHEST 1 VIEW COMPARISON:  12/13/2019 FINDINGS: Sternal wires overlie normal cardiac silhouette. Chest tube at RIGHT lung base. Moderate size RIGHT effusion remains. No pneumothorax. LEFT lung clear IMPRESSION: 1. RIGHT pleural effusion with chest tube at the RIGHT lung base. 2. LEFT lung clear. Electronically Signed   By: Suzy Bouchard M.D.   On: 12/13/2019 18:08   DG Swallowing Func-Speech Pathology  Result Date: 12/14/2019 Objective Swallowing Evaluation: Type of Study: MBS-Modified Barium Swallow Study  Patient Details Name: DOIS JUARBE MRN: [id number] Date of Birth: 04-06-1936 Today's Date: 12/14/2019 Time: SLP Start Time (  ACUTE ONLY): 1320 -SLP Stop Time (ACUTE ONLY): 1341 SLP Time Calculation (min) (ACUTE ONLY): 21 min Past Medical History: Past Medical History: Diagnosis Date . Arthritis  . Bifascicular block  . Coronary artery disease   Post CABG in 2001 with patent grafts in 2010  (90% mid LAD stenosis with competing flow from LIMA distally, 90% OM stenosis with competing flow from SVG,  90% RCA stenosis with competing flow from SVG, occluded SVG to Posterolateral branch). . Gastritis  . GERD (gastroesophageal reflux disease)  . History of nephrolithiasis  . Hyperlipidemia  . Hypertension  . Partial tear of rotator cuff(726.13)   Right . Psoriasis  Past Surgical History: Past Surgical History: Procedure Laterality Date . BACK SURGERY   . CARDIAC CATHETERIZATION  01/2000  Multi vessel PTCA . CARDIAC CATHETERIZATION  11/2008  multi vesstel dz, re occlusion small vessel . CORONARY ARTERY BYPASS GRAFT  2001 . CT ABD W & PELVIS WO CM  09/2001  Kidney stones . CT ABD WO/W & PELVIS WO CM  10/14/2007  Low grade obstruction on R by 93mm prox ereteral calculus, Sm bilat renal calculi sm HH . Diffuision of unstable angina  03/2000 . ESOPHAGOGASTRODUODENOSCOPY  08/2008  Mild gastritis and GERD . INGUINAL HERNIA REPAIR Right 09/10/2015  Procedure: RIGHT INGUINAL HERNIA REPAIR WITH MESH;  Surgeon: Coralie Keens, MD;  Location: [facility];  Service: General;  Laterality: Right; . INSERTION OF MESH Right 09/10/2015  Procedure: INSERTION OF MESH;  Surgeon: Coralie Keens, MD;  Location: [facility];  Service: General;  Laterality: Right; . KIDNEY STONE SURGERY  10/2007  Removal R ureteral kidney stone . NASAL SINUS SURGERY  04/2002 . NECK SURGERY   .  Pelvic CT  10/14/2007  nml . PTCA  01/2000  Subendocardial MI . Stress cardiolite  10/2003  No acute changes . TONSILLECTOMY   HPI: Pt is an 84 y.o. male with medical history significant of CAD; HTN; and HLD presenting with SOB. CXR 6/9: Right-sided pleural effusion with likely underlying atelectasis/infiltrate. CT chest: Moderately large partially loculated right pleural effusion with fluid tracking into the minor fissure. Adjacent compressive atelectasis, including majority of the right lower lobe. No definite pleural nodularity or enhancement.  No data recorded Assessment / Plan / Recommendation CHL IP CLINICAL IMPRESSIONS 12/14/2019 Clinical Impression Pt presents with mild oropharyngeal dysphagia characterized by reduced bolus cohesion, reduced lingual retraction, reduced anterior laryngeal movement, and a pharyngeal delay. He demonstrated trace-mild vallecular and pyriform sinus residue, and penetration (PAS 3) and inconsistent aspiration (PAS 7,8) of thin liquids via straw secondary to premature spillage and pharyngeal delay. A chin tuck posture was attempted to eliminate aspiration, but increased frequency of penetration with thin liquids. It is recommended that the current diet of regular texture solids with thin liquids be continued with observance of swallowing precautions. SLP will continue to follow pt. SLP Visit Diagnosis Dysphagia, oropharyngeal phase (R13.12) Attention and concentration deficit following -- Frontal lobe and executive function deficit following -- Impact on safety and function Mild aspiration risk   CHL IP TREATMENT RECOMMENDATION 12/14/2019 Treatment Recommendations Therapy as outlined in treatment plan below   Prognosis 12/14/2019 Prognosis for Safe Diet Advancement Good Barriers to Reach Goals -- Barriers/Prognosis Comment -- CHL IP DIET RECOMMENDATION 12/14/2019 SLP Diet Recommendations Regular solids;Thin liquid Liquid Administration via Cup;No straw Medication Administration Whole  meds with liquid Compensations Slow rate;Small sips/bites Postural Changes Seated upright at 90 degrees   CHL IP OTHER RECOMMENDATIONS 12/14/2019 Recommended Consults -- Oral Care Recommendations Oral care  BID;Patient independent with oral care Other Recommendations --   CHL IP FOLLOW UP RECOMMENDATIONS 12/14/2019 Follow up Recommendations Outpatient SLP   CHL IP FREQUENCY AND DURATION 12/14/2019 Speech Therapy Frequency (ACUTE ONLY) min 2x/week Treatment Duration 2 weeks      CHL IP ORAL PHASE 12/14/2019 Oral Phase Impaired Oral - Pudding Teaspoon -- Oral - Pudding Cup -- Oral - Honey Teaspoon -- Oral - Honey Cup -- Oral - Nectar Teaspoon -- Oral - Nectar Cup -- Oral - Nectar Straw -- Oral - Thin Teaspoon -- Oral - Thin Cup Decreased bolus cohesion;Premature spillage Oral - Thin Straw Decreased bolus cohesion;Premature spillage Oral - Puree -- Oral - Mech Soft -- Oral - Regular -- Oral - Multi-Consistency -- Oral - Pill -- Oral Phase - Comment --  CHL IP PHARYNGEAL PHASE 12/14/2019 Pharyngeal Phase Impaired Pharyngeal- Pudding Teaspoon -- Pharyngeal -- Pharyngeal- Pudding Cup -- Pharyngeal -- Pharyngeal- Honey Teaspoon -- Pharyngeal -- Pharyngeal- Honey Cup -- Pharyngeal -- Pharyngeal- Nectar Teaspoon -- Pharyngeal -- Pharyngeal- Nectar Cup -- Pharyngeal -- Pharyngeal- Nectar Straw -- Pharyngeal -- Pharyngeal- Thin Teaspoon -- Pharyngeal -- Pharyngeal- Thin Cup Reduced anterior laryngeal mobility;Pharyngeal residue - valleculae;Reduced tongue base retraction;Pharyngeal residue - pyriform Pharyngeal Material does not enter airway Pharyngeal- Thin Straw Penetration/Aspiration before swallow;Penetration/Aspiration during swallow;Reduced anterior laryngeal mobility;Pharyngeal residue - valleculae;Reduced tongue base retraction Pharyngeal Material enters airway, remains ABOVE vocal cords and not ejected out;Material enters airway, passes BELOW cords and not ejected out despite cough attempt by patient;Material enters airway,  passes BELOW cords without attempt by patient to eject out (silent aspiration) Pharyngeal- Puree Reduced anterior laryngeal mobility;Pharyngeal residue - valleculae;Reduced tongue base retraction;Pharyngeal residue - pyriform Pharyngeal Material does not enter airway Pharyngeal- Mechanical Soft -- Pharyngeal -- Pharyngeal- Regular Reduced anterior laryngeal mobility;Pharyngeal residue - valleculae;Reduced tongue base retraction;Pharyngeal residue - pyriform Pharyngeal -- Pharyngeal- Multi-consistency -- Pharyngeal -- Pharyngeal- Pill Reduced anterior laryngeal mobility;Pharyngeal residue - valleculae;Reduced tongue base retraction;Pharyngeal residue - pyriform Pharyngeal -- Pharyngeal Comment --  CHL IP CERVICAL ESOPHAGEAL PHASE 12/14/2019 Cervical Esophageal Phase WFL Pudding Teaspoon -- Pudding Cup -- Honey Teaspoon -- Honey Cup -- Nectar Teaspoon -- Nectar Cup -- Nectar Straw -- Thin Teaspoon -- Thin Cup -- Thin Straw -- Puree -- Mechanical Soft -- Regular -- Multi-consistency -- Pill -- Cervical Esophageal Comment -- Shanika I. Hardin Negus, [place], [place] Office number [phone] Pager [phone] Horton Marshall 12/14/2019, 2:32 PM               Lab Results:  CBC    Component Value Date/Time   WBC 9.0 12/22/2019 0100   RBC 4.87 12/22/2019 0100   HGB 15.3 12/22/2019 0100   HCT 45.4 12/22/2019 0100   PLT 330.0 12/22/2019 0100   MCV 93.3 12/22/2019 0100   MCH 31.2 12/15/2019 0614   MCHC 33.7 12/22/2019 0100   RDW 12.9 12/22/2019 0100   LYMPHSABS 2.2 12/22/2019 0100   MONOABS 0.8 12/22/2019 0100   EOSABS 0.5 12/22/2019 0100   BASOSABS 0.0 12/22/2019 0100    BMET    Component Value Date/Time   NA 138 12/14/2019 0347   K 3.6 12/14/2019 0347   CL 100 12/14/2019 0347   CO2 29 12/14/2019 0347   GLUCOSE 139 (H) 12/14/2019 0347   BUN 13 12/14/2019 0347   CREATININE 0.88 12/14/2019 0347   CALCIUM 8.9 12/14/2019 0347   GFRNONAA >60 12/14/2019 0347   GFRAA >60  12/14/2019 0347    BNP    Component Value Date/Time  BNP 89.3 12/13/2019 1130    ProBNP No results found for: PROBNP  Specialty Problems      Pulmonary Problems   RHINITIS, CHRONIC    Qualifier: History of  By: Glori Bickers MD, Carmell Austria       Loculated pleural effusion   Pleural effusion      No Known Allergies  Immunization History  Administered Date(s) Administered  . Fluad Quad(high Dose 65+) 03/15/2019  . Influenza Split 06/06/2011  . Influenza Whole 04/14/2007, 04/04/2008, 04/25/2009, 04/08/2010  . Influenza, High Dose Seasonal PF 04/27/2015, 04/29/2016, 04/10/2017, 04/09/2018  . Influenza-Unspecified 04/06/2014  . Moderna SARS-COVID-2 Vaccination 08/07/2019, 09/04/2019  . Pneumococcal Conjugate-13 11/06/2013  . Pneumococcal Polysaccharide-23 04/24/2002, 04/14/2007  . Td 07/06/2005, 11/11/2012  . Tdap 10/20/2019  . Zoster Recombinat (Shingrix) 04/07/2019, 07/17/2019    Past Medical History:  Diagnosis Date  . Arthritis   . Bifascicular block   . Coronary artery disease    Post CABG in 2001 with patent grafts in 2010  (90% mid LAD stenosis with competing flow from LIMA distally, 90% OM stenosis with competing flow from SVG,  90% RCA stenosis with competing flow from SVG, occluded SVG to Posterolateral branch).  . Gastritis   . GERD (gastroesophageal reflux disease)   . History of nephrolithiasis   . Hyperlipidemia   . Hypertension   . Partial tear of rotator cuff(726.13)    Right  . Psoriasis     Tobacco History: Social History   Tobacco Use  Smoking Status Former Smoker  . Packs/day: 1.50  . Years: 50.00  . Pack years: 75.00  . Quit date: 12/05/1999  . Years since quitting: 20.0  Smokeless Tobacco Never Used   Counseling given: Yes   Continue to not smoke  Outpatient Encounter Medications as of 01/03/2020  Medication Sig  . amLODipine (NORVASC) 5 MG tablet Take 1 tablet (5 mg total) by mouth daily.  Marland Kitchen aspirin EC 81 MG tablet Take 1 tablet  (81 mg total) by mouth daily.  Marland Kitchen atorvastatin (LIPITOR) 10 MG tablet Take by mouth.  . hydrochlorothiazide (HYDRODIURIL) 25 MG tablet Take 1 tablet (25 mg total) by mouth daily.  . Lactobacillus (PROBIOTIC ACIDOPHILUS) CAPS Take 1 capsule by mouth in the morning and at bedtime.  Marland Kitchen latanoprost (XALATAN) 0.005 % ophthalmic solution Place 1 drop into both eyes at bedtime.  . Multiple Vitamin (MULTIVITAMIN) tablet Take 1 tablet by mouth daily.    . Omega-3 Fatty Acids (FISH OIL) 1000 MG CAPS Take 1 capsule by mouth daily.  Marland Kitchen omeprazole (PRILOSEC) 20 MG capsule Take 1 capsule (20 mg total) by mouth daily as needed. For acid reflux (Patient taking differently: Take 20 mg by mouth daily. For acid reflux)  . [DISCONTINUED] aspirin 81 MG chewable tablet Chew by mouth.   No facility-administered encounter medications on file as of 01/03/2020.     Review of Systems  Review of Systems  Constitutional: Positive for fatigue. Negative for activity change, chills, fever and unexpected weight change.  HENT: Negative for postnasal drip, rhinorrhea, sinus pressure, sinus pain and sore throat.   Eyes: Negative.   Respiratory: Positive for shortness of breath. Negative for cough and wheezing.        Right pleural/chest wall pain  Cardiovascular: Negative for chest pain and palpitations.  Gastrointestinal: Negative for constipation, diarrhea, nausea and vomiting.  Endocrine: Negative.   Genitourinary: Negative.   Musculoskeletal: Negative.   Skin: Negative.   Neurological: Negative for dizziness and headaches.  Psychiatric/Behavioral: Negative.  Negative  for dysphoric mood. The patient is not nervous/anxious.   All other systems reviewed and are negative.    Physical Exam  BP 130/82 (BP Location: Left Arm, Cuff Size: Normal)   Pulse 84   Temp 98.2 F (36.8 C) (Oral)   Ht 5\' 10"  (1.778 m)   Wt 183 lb (83 kg)   SpO2 96%   BMI 26.26 kg/m   Wt Readings from Last 5 Encounters:  01/03/20 183 lb (83  kg)  12/22/19 185 lb (83.9 kg)  12/15/19 176 lb 14.4 oz (80.2 kg)  03/15/19 183 lb 8 oz (83.2 kg)  08/30/18 185 lb 8 oz (84.1 kg)    BMI Readings from Last 5 Encounters:  01/03/20 26.26 kg/m  12/22/19 26.54 kg/m  12/15/19 25.38 kg/m  03/15/19 26.14 kg/m  08/30/18 26.62 kg/m     Physical Exam Vitals and nursing note reviewed.  Constitutional:      General: He is not in acute distress.    Appearance: Normal appearance. He is normal weight.  HENT:     Head: Normocephalic and atraumatic.     Right Ear: Hearing and external ear normal.     Left Ear: Hearing and external ear normal.     Nose: Nose normal. No mucosal edema or rhinorrhea.     Right Turbinates: Not enlarged.     Left Turbinates: Not enlarged.     Mouth/Throat:     Mouth: Mucous membranes are dry.     Pharynx: Oropharynx is clear. No oropharyngeal exudate.  Eyes:     Pupils: Pupils are equal, round, and reactive to light.  Cardiovascular:     Rate and Rhythm: Normal rate and regular rhythm.     Pulses: Normal pulses.     Heart sounds: Normal heart sounds. No murmur heard.   Pulmonary:     Effort: Pulmonary effort is normal.     Breath sounds: Examination of the right-lower field reveals decreased breath sounds. Decreased breath sounds present. No wheezing or rales.  Chest:     Chest wall: Tenderness (Tenderness around right chest wall pigtail site, right base of lung) present.    Musculoskeletal:     Cervical back: Normal range of motion.     Right lower leg: No edema.     Left lower leg: No edema.  Lymphadenopathy:     Cervical: No cervical adenopathy.  Skin:    General: Skin is warm and dry.     Capillary Refill: Capillary refill takes less than 2 seconds.     Findings: No erythema or rash.  Neurological:     General: No focal deficit present.     Mental Status: He is alert and oriented to person, place, and time.     Motor: No weakness.     Coordination: Coordination normal.     Gait: Gait is  intact. Gait normal.  Psychiatric:        Mood and Affect: Mood normal.        Behavior: Behavior normal. Behavior is cooperative.        Thought Content: Thought content normal.        Judgment: Judgment normal.       Assessment & Plan:   Discussion: Reviewed case with Dr. Tamala Julian who saw the patient originally inpatient.  We will proceed forward with second thoracentesis tomorrow with Marni Griffon, NP.  I have updated Marni Griffon, NP with plan of care.  Patient likely will need Pleurx catheter in the future.  We  will get patient established with interventional pulmonologist with our practice for outpatient follow-up.  Pleural effusion Patient with increased pleural pain Increased dyspnea on exertion Chest x-ray completed in office today shows right pleural effusion Patient felt symptomatic relief with thoracentesis earlier this month Thoracentesis earlier this month revealed an exudative effusion, eosinophilic, cytology shows reactive mesothelial cells  Discussion: Discussed case with Dr. Tamala Julian over the phone.  Given the pleurisy, and return of pleural fluid will prepare for second outpatient thoracentesis.  May need to consider Pleurx catheter in the future.  With the eosinophilia and reactive mesothelial cells evaluated CT chest no appreciable pleural thickening for concern of biopsy.  We will continue to clinically monitor.  We will test pleural fluid again at thoracentesis tomorrow with Marni Griffon, NP.  Plan: We will prepare for outpatient thoracentesis tomorrow with Marni Griffon, NP Hold daily baby aspirin We will need follow-up with our office in 2 weeks with chest x-ray  Loculated pleural effusion Healing old pigtail site Return of right pleural effusion  Plan: We will plan for outpatient thoracentesis tomorrow with Marni Griffon, NP    Return in about 2 weeks (around 01/17/2020), or if symptoms worsen or fail to improve, for Follow up with [name] FNP-C.   Lauraine Rinne, NP 01/03/2020   This appointment required 55 minutes of patient care (this includes precharting, chart review, review of results, face-to-face care, etc.).

## 2020-01-03 NOTE — Assessment & Plan Note (Signed)
Healing old pigtail site Return of right pleural effusion  Plan: We will plan for outpatient thoracentesis tomorrow with Marni Griffon, NP

## 2020-01-04 ENCOUNTER — Encounter (HOSPITAL_COMMUNITY): Payer: Self-pay | Admitting: Internal Medicine

## 2020-01-04 ENCOUNTER — Ambulatory Visit (HOSPITAL_COMMUNITY)
Admission: RE | Admit: 2020-01-04 | Discharge: 2020-01-04 | Disposition: A | Discharge status: Home / Self Care | Payer: Medicare Other | Attending: Internal Medicine | Admitting: Internal Medicine

## 2020-01-04 ENCOUNTER — Encounter (HOSPITAL_COMMUNITY)
Admission: RE | Disposition: A | Discharge status: Home / Self Care | Payer: Self-pay | Source: Home / Self Care | Attending: Internal Medicine

## 2020-01-04 ENCOUNTER — Ambulatory Visit (HOSPITAL_COMMUNITY): Payer: Medicare Other

## 2020-01-04 DIAGNOSIS — Z7982 Long term (current) use of aspirin: Secondary | ICD-10-CM | POA: Insufficient documentation

## 2020-01-04 DIAGNOSIS — Z9889 Other specified postprocedural states: Secondary | ICD-10-CM

## 2020-01-04 DIAGNOSIS — K219 Gastro-esophageal reflux disease without esophagitis: Secondary | ICD-10-CM | POA: Insufficient documentation

## 2020-01-04 DIAGNOSIS — I2510 Atherosclerotic heart disease of native coronary artery without angina pectoris: Secondary | ICD-10-CM | POA: Diagnosis not present

## 2020-01-04 DIAGNOSIS — Z79899 Other long term (current) drug therapy: Secondary | ICD-10-CM | POA: Diagnosis not present

## 2020-01-04 DIAGNOSIS — R5381 Other malaise: Secondary | ICD-10-CM | POA: Insufficient documentation

## 2020-01-04 DIAGNOSIS — J90 Pleural effusion, not elsewhere classified: Secondary | ICD-10-CM | POA: Insufficient documentation

## 2020-01-04 DIAGNOSIS — E785 Hyperlipidemia, unspecified: Secondary | ICD-10-CM | POA: Diagnosis not present

## 2020-01-04 DIAGNOSIS — Z87891 Personal history of nicotine dependence: Secondary | ICD-10-CM | POA: Diagnosis not present

## 2020-01-04 DIAGNOSIS — I10 Essential (primary) hypertension: Secondary | ICD-10-CM | POA: Diagnosis not present

## 2020-01-04 DIAGNOSIS — D72825 Bandemia: Secondary | ICD-10-CM | POA: Diagnosis not present

## 2020-01-04 DIAGNOSIS — Z951 Presence of aortocoronary bypass graft: Secondary | ICD-10-CM | POA: Diagnosis not present

## 2020-01-04 HISTORY — PX: THORACENTESIS: SHX235

## 2020-01-04 LAB — LACTATE DEHYDROGENASE, PLEURAL OR PERITONEAL FLUID: LD, Fluid: 141 U/L — ABNORMAL HIGH (ref 3–23)

## 2020-01-04 LAB — PROTEIN, PLEURAL OR PERITONEAL FLUID: Total protein, fluid: 3.2 g/dL

## 2020-01-04 LAB — BODY FLUID CELL COUNT WITH DIFFERENTIAL
Eos, Fluid: 0 %
Lymphs, Fluid: 93 %
Monocyte-Macrophage-Serous Fluid: 5 % — ABNORMAL LOW (ref 50–90)
Neutrophil Count, Fluid: 2 % (ref 0–25)
Total Nucleated Cell Count, Fluid: 238 cu mm (ref 0–1000)

## 2020-01-04 SURGERY — THORACENTESIS

## 2020-01-04 NOTE — Procedures (Signed)
Thoracentesis  Procedure Note  Joel [name]  [id number]  [birth year]  Date:01/04/20  Time:2:44 PM   Provider Performing:Pete E Kary Kos   Procedure: Thoracentesis with imaging guidance ([id number])  Indication(s) Pleural Effusion  Consent Risks of the procedure as well as the alternatives and risks of each were explained to the patient and/or caregiver.  Consent for the procedure was obtained and is signed in the bedside chart  Anesthesia Topical only with 1% lidocaine    Time Out Verified patient identification, verified procedure, site/side was marked, verified correct patient position, special equipment/implants available, medications/allergies/relevant history reviewed, required imaging and test results available.   Sterile Technique Maximal sterile technique including full sterile barrier drape, hand hygiene, sterile gown, sterile gloves, mask, hair covering, sterile ultrasound probe cover (if used).  Procedure Description Ultrasound was used to identify appropriate pleural anatomy for placement and overlying skin marked.  Area of drainage cleaned and draped in sterile fashion. Lidocaine was used to anesthetize the skin and subcutaneous tissue.  90 cc's of clear yellow  appearing fluid was drained from the right pleural space. Catheter then removed and bandaid applied to site.   Complications/Tolerance None; patient tolerated the procedure well. Chest X-ray is ordered to confirm no post-procedural complication.   EBL Minimal   Specimen(s) Pleural fluid     Erick Colace ACNP-BC [place] Pager # [phone] OR # [phone] if no answer

## 2020-01-04 NOTE — Telephone Encounter (Signed)
Left VM x1 requesting a return call to schedule 30 minute new patient visit  visit with either Dr. Valeta Harms or Dr. Carlis Abbott in the next 4-8 weeks.

## 2020-01-05 ENCOUNTER — Encounter (HOSPITAL_COMMUNITY): Payer: Self-pay | Admitting: Internal Medicine

## 2020-01-05 LAB — PH, BODY FLUID: pH, Body Fluid: 7.8

## 2020-01-06 LAB — CHOLESTEROL, BODY FLUID: Cholesterol, Fluid: 65 mg/dL

## 2020-01-06 NOTE — H&P (Signed)
See office visit note from [name] 01/03/20.

## 2020-01-07 LAB — BODY FLUID CULTURE
Culture: NO GROWTH
Gram Stain: NONE SEEN
Special Requests: NORMAL

## 2020-01-08 LAB — PATHOLOGIST SMEAR REVIEW

## 2020-01-16 NOTE — H&P (Signed)
Patient here for thoracentesis. On exam lungs are diminished R base. Will do thoracentesis.

## 2020-01-24 NOTE — Telephone Encounter (Signed)
Yes that is okay.  30-minute time slot please.[name], FNP

## 2020-01-24 NOTE — Telephone Encounter (Signed)
Patient States his daughter will call back tomorrow to Schedule consult with Dr. Silas Flood. Please schedule this when he calls back tomorrow.

## 2020-01-24 NOTE — Telephone Encounter (Signed)
Patient would like to be seen sooner. States has fluid in lungs. Patient phone number is [phone].

## 2020-01-24 NOTE — Telephone Encounter (Signed)
Aaron Edelman patient would like to be sooner would it be okay to schedule with Dr. Silas Flood?

## 2020-03-15 ENCOUNTER — Ambulatory Visit (INDEPENDENT_AMBULATORY_CARE_PROVIDER_SITE_OTHER): Payer: Medicare Other | Admitting: Family Medicine

## 2020-03-15 ENCOUNTER — Encounter: Payer: Self-pay | Admitting: Family Medicine

## 2020-03-15 ENCOUNTER — Other Ambulatory Visit: Payer: Self-pay

## 2020-03-15 VITALS — BP 137/82 | HR 61 | Temp 97.4°F | Ht 70.0 in | Wt 182.2 lb

## 2020-03-15 DIAGNOSIS — R7303 Prediabetes: Secondary | ICD-10-CM | POA: Diagnosis not present

## 2020-03-15 DIAGNOSIS — I10 Essential (primary) hypertension: Secondary | ICD-10-CM | POA: Diagnosis not present

## 2020-03-15 DIAGNOSIS — E7800 Pure hypercholesterolemia, unspecified: Secondary | ICD-10-CM | POA: Diagnosis not present

## 2020-03-15 DIAGNOSIS — Z0000 Encounter for general adult medical examination without abnormal findings: Secondary | ICD-10-CM | POA: Diagnosis not present

## 2020-03-15 DIAGNOSIS — R42 Dizziness and giddiness: Secondary | ICD-10-CM

## 2020-03-15 DIAGNOSIS — J90 Pleural effusion, not elsewhere classified: Secondary | ICD-10-CM

## 2020-03-15 LAB — COMPREHENSIVE METABOLIC PANEL WITH GFR
ALT: 17 U/L (ref 0–53)
AST: 18 U/L (ref 0–37)
Albumin: 4.3 g/dL (ref 3.5–5.2)
Alkaline Phosphatase: 87 U/L (ref 39–117)
BUN: 14 mg/dL (ref 6–23)
CO2: 32 meq/L (ref 19–32)
Calcium: 9.5 mg/dL (ref 8.4–10.5)
Chloride: 102 meq/L (ref 96–112)
Creatinine, Ser: 0.95 mg/dL (ref 0.40–1.50)
GFR: 75.9 mL/min
Glucose, Bld: 99 mg/dL (ref 70–99)
Potassium: 4.9 meq/L (ref 3.5–5.1)
Sodium: 139 meq/L (ref 135–145)
Total Bilirubin: 0.9 mg/dL (ref 0.2–1.2)
Total Protein: 6.5 g/dL (ref 6.0–8.3)

## 2020-03-15 LAB — LIPID PANEL
Cholesterol: 225 mg/dL — ABNORMAL HIGH (ref 0–200)
HDL: 44.5 mg/dL (ref >39.00)
LDL Cholesterol: 141 mg/dL — ABNORMAL HIGH (ref 0–99)
NonHDL: 180.23
Total CHOL/HDL Ratio: 5
Triglycerides: 196 mg/dL — ABNORMAL HIGH (ref 0.0–149.0)
VLDL: 39.2 mg/dL (ref 0.0–40.0)

## 2020-03-15 LAB — TSH: TSH: 1.45 u[IU]/mL (ref 0.35–4.50)

## 2020-03-15 LAB — CBC WITH DIFFERENTIAL/PLATELET
Basophils Absolute: 0 10*3/uL (ref 0.0–0.1)
Basophils Relative: 0.5 % (ref 0.0–3.0)
Eosinophils Absolute: 0.3 10*3/uL (ref 0.0–0.7)
Eosinophils Relative: 4.2 % (ref 0.0–5.0)
HCT: 47.8 % (ref 39.0–52.0)
Hemoglobin: 16.3 g/dL (ref 13.0–17.0)
Lymphocytes Relative: 27.2 % (ref 12.0–46.0)
Lymphs Abs: 2.1 10*3/uL (ref 0.7–4.0)
MCHC: 34.1 g/dL (ref 30.0–36.0)
MCV: 92.4 fl (ref 78.0–100.0)
Monocytes Absolute: 0.8 10*3/uL (ref 0.1–1.0)
Monocytes Relative: 10.4 % (ref 3.0–12.0)
Neutro Abs: 4.5 10*3/uL (ref 1.4–7.7)
Neutrophils Relative %: 57.7 % (ref 43.0–77.0)
Platelets: 238 10*3/uL (ref 150.0–400.0)
RBC: 5.17 Mil/uL (ref 4.22–5.81)
RDW: 13.6 % (ref 11.5–15.5)
WBC: 7.8 10*3/uL (ref 4.0–10.5)

## 2020-03-15 LAB — HEMOGLOBIN A1C: Hgb A1c MFr Bld: 6.4 % (ref 4.6–6.5)

## 2020-03-15 NOTE — Progress Notes (Signed)
Subjective:    Patient ID: Joel [name], male    DOB: January 03, 1936, 84 y.o.   MRN: [id number]  This visit occurred during the SARS-CoV-2 public health emergency.  Safety protocols were in place, including screening questions prior to the visit, additional usage of staff PPE, and extensive cleaning of exam room while observing appropriate contact time as indicated for disinfecting solutions.    HPI  Pt presents for amw and health mt exam with review of chronic medical problems   I have personally reviewed the Medicare Annual Wellness questionnaire and have noted 1. The patient's medical and social history 2. Their use of alcohol, tobacco or illicit drugs 3. Their current medications and supplements 4. The patient's functional ability including ADL's, fall risks, home safety risks and hearing or visual             impairment. 5. Diet and physical activities 6. Evidence for depression or mood disorders  The patients weight, height, BMI have been recorded in the chart and visual acuity is per eye clinic.  I have made referrals, counseling and provided education to the patient based review of the above and I have provided the pt with a written personalized care plan for preventive services. Reviewed and updated provider list, see scanned forms.  See scanned forms.  Routine anticipatory guidance given to patient.  See health maintenance.  Flu vaccine- has planned at the [place] at 2:30 along with covid booster shot  Tetanus vaccine Tdap 4/21 Pneumovax-completed covid immunized- moderna Zoster vaccine-has had shingrix vaccine   Falls-none since 2 y ago  Praxair  Exercise - overall busy and fairly active on the days he feels up to it Likes to hunt  Prostate cancer screening- out aged, formerly saw urology He has nocturia -up to 5 times at the very most  Took medicine in the past-not now  Advance directive-he has utd living will and poa  Cognitive function  addressed- see scanned forms- and if abnormal then additional documentation follows.   When he is dizzy/vertigo it makes it harder to concentrate (has a processing problem for years) Tougher when tired or dizzy  Short term memory is about the same    PMH and SH reviewed  Meds, vitals, and allergies reviewed.   ROS: See HPI.  Otherwise negative.    Weight :  Wt Readings from Last 3 Encounters:  03/15/20 182 lb 3 oz (82.6 kg)  01/03/20 183 lb (83 kg)  12/22/19 185 lb (83.9 kg)   26.14 kg/m  No change in appetite Eating very well   Also goes to the [place] Vertigo problems-getting worked up for it  Has good and bad days   Has had thoracentesis  His fluid is decreasing over time Pain is better (R sided)  Emotionally is fair/ struggles at times Continues with f/u for that   Just ended a heart monitor-mailed it off    Hearing/vision: Pt wears hearing aides (hearing is very poor)   Hearing Screening   125Hz  250Hz  500Hz  1000Hz  2000Hz  3000Hz  4000Hz  6000Hz  8000Hz   Right ear:           Left ear:           Comments: Pt wears hearing aides   Vision Screening Comments: Eye appt on 03/20/20 at [place] team Joel [name]-pcp Warner Mccreedy NP- pulmonary , and [place]  HTN bp is stable today  No cp or palpitations or headaches or edema  No side  effects to medicines  BP Readings from Last 3 Encounters:  03/15/20 140/90  01/04/20 (!) 145/89  01/03/20 130/82    Per pt bp goes up and down- usually in 130s on his monitor  BP: 137/82 on 2nd check    Pulse Readings from Last 3 Encounters:  03/15/20 61  01/04/20 69  01/03/20 84   History of CAD    Hyperlipidemia Lab Results  Component Value Date   CHOL 206 (H) 03/08/2019   HDL 39.80 03/08/2019   LDLCALC 126 (H) 03/08/2019   TRIG 199.0 (H) 03/08/2019   CHOLHDL 5 03/08/2019   Taking atorvastatin   Prediabetes Lab Results  Component Value Date   HGBA1C 6.3 03/08/2019   Patient Active Problem List   Diagnosis Date Noted  .  Status post thoracentesis   . Gallstones 12/22/2019  . Cancer of skin of right ear 12/22/2019  . Dysphagia 12/22/2019  . Pleural effusion 12/14/2019  . Loculated pleural effusion 12/13/2019  . Dizziness 08/30/2018  . Routine general medical examination at a health care facility 11/09/2016  . Hernia, inguinal 06/06/2015  . History of fall 11/09/2014  . Encounter for Medicare annual wellness exam 11/06/2013  . Prostate cancer screening 05/09/2013  . Prediabetes 07/31/2011  . HTN (hypertension) 06/16/2011  . Night muscle spasms 01/21/2011  . Low back pain 01/16/2010  . CAD, AUTOLOGOUS BYPASS GRAFT 11/07/2008  . FLATULENCE-GAS-BLOATING 08/02/2008  . URINARY CALCULUS 10/17/2007  . Coronary atherosclerosis 10/03/2007  . Hyperlipidemia 04/13/2007  . Hearing loss 04/13/2007  . RHINITIS, CHRONIC 04/13/2007  . GERD 04/13/2007  . Actinic keratosis 04/13/2007  . NEPHROLITHIASIS, HX OF 04/13/2007   Past Medical History:  Diagnosis Date  . Arthritis   . Bifascicular block   . Coronary artery disease    Post CABG in 2001 with patent grafts in 2010  (90% mid LAD stenosis with competing flow from LIMA distally, 90% OM stenosis with competing flow from SVG,  90% RCA stenosis with competing flow from SVG, occluded SVG to Posterolateral branch).  . Gastritis   . GERD (gastroesophageal reflux disease)   . History of nephrolithiasis   . Hyperlipidemia   . Hypertension   . Partial tear of rotator cuff(726.13)    Right  . Psoriasis    Past Surgical History:  Procedure Laterality Date  . BACK SURGERY    . CARDIAC CATHETERIZATION  01/2000   Multi vessel PTCA  . CARDIAC CATHETERIZATION  11/2008   multi vesstel dz, re occlusion small vessel  . CORONARY ARTERY BYPASS GRAFT  2001  . CT ABD W & PELVIS WO CM  09/2001   Kidney stones  . CT ABD WO/W & PELVIS WO CM  10/14/2007   Low grade obstruction on R by 51mm prox ereteral calculus, Sm bilat renal calculi sm HH  . Diffuision of unstable angina   03/2000  . ESOPHAGOGASTRODUODENOSCOPY  08/2008   Mild gastritis and GERD  . INGUINAL HERNIA REPAIR Right 09/10/2015   Procedure: RIGHT INGUINAL HERNIA REPAIR WITH MESH;  Surgeon: Coralie Keens, MD;  Location: [facility];  Service: General;  Laterality: Right;  . INSERTION OF MESH Right 09/10/2015   Procedure: INSERTION OF MESH;  Surgeon: Coralie Keens, MD;  Location: [facility];  Service: General;  Laterality: Right;  . KIDNEY STONE SURGERY  10/2007   Removal R ureteral kidney stone  . NASAL SINUS SURGERY  04/2002  . NECK SURGERY    . Pelvic CT  10/14/2007   nml  . PTCA  01/2000  Subendocardial MI  . Stress cardiolite  10/2003   No acute changes  . THORACENTESIS N/A 01/04/2020   Procedure: Mathews Robinsons;  Surgeon: Candee Furbish, MD;  Location: [facility] ENDOSCOPY;  Service: Pulmonary;  Laterality: N/A;  . TONSILLECTOMY     Social History   Tobacco Use  . Smoking status: Former Smoker    Packs/day: 1.50    Years: 50.00    Pack years: 75.00    Quit date: 12/05/1999    Years since quitting: 20.2  . Smokeless tobacco: Never Used  Vaping Use  . Vaping Use: Never used  Substance Use Topics  . Alcohol use: No    Alcohol/week: 0.0 standard drinks  . Drug use: No   Family History  Problem Relation Age of Onset  . Transient ischemic attack Mother   . Coronary artery disease Mother   . Heart disease Mother        CAD  . Alcohol abuse Father   . Diabetes type II Father   . Diabetes Father   . Heart disease Father        pacemaker   No Known Allergies Current Outpatient Medications on File Prior to Visit  Medication Sig Dispense Refill  . amLODipine (NORVASC) 5 MG tablet Take 1 tablet (5 mg total) by mouth daily. 30 tablet 1  . aspirin EC 81 MG tablet Take 1 tablet (81 mg total) by mouth daily. 90 tablet 3  . fluorouracil (EFUDEX) 5 % cream Apply topically every 2 (two) hours while awake.    . hydrochlorothiazide (HYDRODIURIL) 25 MG tablet Take 1 tablet (25 mg total) by mouth daily. 90  tablet 1  . hydrophilic ointment Apply topically as needed for dry skin. For use with Fluorouracil    . Lactobacillus (PROBIOTIC ACIDOPHILUS) CAPS Take 1 capsule by mouth in the morning and at bedtime. 30 capsule 0  . latanoprost (XALATAN) 0.005 % ophthalmic solution Place 1 drop into both eyes at bedtime.    . Multiple Vitamin (MULTIVITAMIN) tablet Take 1 tablet by mouth daily.      . Omega-3 Fatty Acids (FISH OIL) 1000 MG CAPS Take 1 capsule by mouth daily.     No current facility-administered medications on file prior to visit.     Review of Systems  Constitutional: Negative for activity change, appetite change, fatigue, fever and unexpected weight change.  HENT: Positive for hearing loss. Negative for congestion, ear pain, rhinorrhea, sore throat and trouble swallowing.        Ear wax-using debrox  Eyes: Negative for pain, redness, itching and visual disturbance.  Respiratory: Negative for cough, chest tightness, shortness of breath and wheezing.        No sob now  Cardiovascular: Negative for chest pain and palpitations.  Gastrointestinal: Negative for abdominal pain, blood in stool, constipation, diarrhea and nausea.       Chronic bloating/gas  Endocrine: Negative for cold intolerance, heat intolerance, polydipsia and polyuria.  Genitourinary: Negative for difficulty urinating, dysuria, frequency and urgency.  Musculoskeletal: Negative for arthralgias, joint swelling and myalgias.  Skin: Negative for pallor and rash.  Neurological: Positive for dizziness. Negative for tremors, weakness, numbness and headaches.  Hematological: Negative for adenopathy. Does not bruise/bleed easily.  Psychiatric/Behavioral: Negative for decreased concentration and dysphoric mood. The patient is not nervous/anxious.        Objective:   Physical Exam Constitutional:      General: He is not in acute distress.    Appearance: Normal appearance. He is well-developed and normal  weight. He is not  ill-appearing or diaphoretic.  HENT:     Head: Normocephalic and atraumatic.     Right Ear: Tympanic membrane, ear canal and external ear normal.     Left Ear: Tympanic membrane, ear canal and external ear normal.     Ears:     Comments: Cerumen- mild / worse on R    Nose: Nose normal. No congestion.     Mouth/Throat:     Mouth: Mucous membranes are moist.     Pharynx: Oropharynx is clear. No posterior oropharyngeal erythema.  Eyes:     General: No scleral icterus.       Right eye: No discharge.        Left eye: No discharge.     Conjunctiva/sclera: Conjunctivae normal.     Pupils: Pupils are equal, round, and reactive to light.  Neck:     Thyroid: No thyromegaly.     Vascular: No carotid bruit or JVD.  Cardiovascular:     Rate and Rhythm: Normal rate and regular rhythm.     Pulses: Normal pulses.     Heart sounds: Normal heart sounds. No gallop.   Pulmonary:     Effort: Pulmonary effort is normal. No respiratory distress.     Breath sounds: Normal breath sounds. No wheezing or rales.     Comments: bs are distant Chest:     Chest wall: No tenderness.  Abdominal:     General: Bowel sounds are normal. There is no distension or abdominal bruit.     Palpations: Abdomen is soft. There is no mass.     Tenderness: There is no abdominal tenderness.     Hernia: No hernia is present.  Musculoskeletal:        General: No tenderness.     Cervical back: Normal range of motion and neck supple. No rigidity. No muscular tenderness.     Right lower leg: No edema.     Left lower leg: No edema.  Lymphadenopathy:     Cervical: No cervical adenopathy.  Skin:    General: Skin is warm and dry.     Coloration: Skin is not pale.     Findings: No erythema or rash.     Comments: AK and SKs and solar aging    Neurological:     Mental Status: He is alert.     Cranial Nerves: No cranial nerve deficit.     Motor: No abnormal muscle tone.     Coordination: Coordination normal.     Gait: Gait  normal.     Deep Tendon Reflexes: Reflexes are normal and symmetric. Reflexes normal.  Psychiatric:        Mood and Affect: Mood normal.        Cognition and Memory: Cognition and memory normal.           Assessment & Plan:   Problem List Items Addressed This Visit      Cardiovascular and Mediastinum   HTN (hypertension)    bp in fair control at this time  BP Readings from Last 1 Encounters:  03/15/20 137/82   No changes needed Most recent labs reviewed  Disc lifstyle change with low sodium diet and exercise        Relevant Orders   CBC with Differential/Platelet (Completed)   Comprehensive metabolic panel (Completed)   Lipid panel (Completed)   TSH (Completed)     Respiratory   Pleural effusion    Pt continues pulm f/u and [facility] f/u  Other   Hyperlipidemia    Due for labs today Disc goals for lipids and reasons to control them Rev last labs with pt Rev low sat fat diet in detail  H/o CAD Taking atorvastatin       Relevant Orders   Lipid panel (Completed)   Prediabetes    A1C today  disc imp of low glycemic diet and wt loss to prevent DM2       Relevant Orders   Hemoglobin A1c (Completed)   Encounter for Medicare annual wellness exam - Primary    Reviewed health habits including diet and exercise and skin cancer prevention Reviewed appropriate screening tests for age  Also reviewed health mt list, fam hx and immunization status , as well as social and family history    See HPI Labs ordered Flu shot -getting today at [place] covid immunized  Had shingrix vaccines No falls or fx  Advance directive is up to date No cognitive concerns Struggling with some chronic health issues incl chronic vertigo and pulm effusions  Hearing is poor-has hearing aides utd eye/vision exam       Routine general medical examination at a health care facility    Reviewed health habits including diet and exercise and skin cancer prevention Reviewed appropriate  screening tests for age  Also reviewed health mt list, fam hx and immunization status , as well as social and family history    See HPI Labs ordered Flu shot -getting today at [place] covid immunized  Had shingrix vaccines No falls or fx  Advance directive is up to date No cognitive concerns Struggling with some chronic health issues incl chronic vertigo and pulm effusions  Hearing is poor-has hearing aides utd eye/vision exam         Dizziness    Chronic vertigo type Has seen ENT Managed at [facility]

## 2020-03-15 NOTE — Patient Instructions (Signed)
Finish your paperwork and return to [place] when you can   Gets your flu shot as planned  Keep taking care of yourself   Continue follow up with the [place] and your specialists

## 2020-03-17 NOTE — Assessment & Plan Note (Signed)
Pt continues pulm f/u and [facility] f/u

## 2020-03-17 NOTE — Assessment & Plan Note (Signed)
Reviewed health habits including diet and exercise and skin cancer prevention Reviewed appropriate screening tests for age  Also reviewed health mt list, fam hx and immunization status , as well as social and family history    See HPI Labs ordered Flu shot -getting today at [place] covid immunized  Had shingrix vaccines No falls or fx  Advance directive is up to date No cognitive concerns Struggling with some chronic health issues incl chronic vertigo and pulm effusions  Hearing is poor-has hearing aides utd eye/vision exam

## 2020-03-17 NOTE — Assessment & Plan Note (Signed)
A1C today  disc imp of low glycemic diet and wt loss to prevent DM2  

## 2020-03-17 NOTE — Assessment & Plan Note (Signed)
Due for labs today Disc goals for lipids and reasons to control them Rev last labs with pt Rev low sat fat diet in detail  H/o CAD Taking atorvastatin

## 2020-03-17 NOTE — Assessment & Plan Note (Signed)
Chronic vertigo type Has seen ENT Managed at [facility]

## 2020-03-17 NOTE — Assessment & Plan Note (Signed)
bp in fair control at this time  BP Readings from Last 1 Encounters:  03/15/20 137/82   No changes needed Most recent labs reviewed  Disc lifstyle change with low sodium diet and exercise

## 2020-03-18 ENCOUNTER — Telehealth: Payer: Self-pay | Admitting: *Deleted

## 2020-03-18 NOTE — Telephone Encounter (Signed)
Left VM requesting pt to call the office back regarding lab results  

## 2020-03-20 NOTE — Telephone Encounter (Signed)
Left 2nd VM requesting pt to call the office back  

## 2020-03-21 NOTE — Telephone Encounter (Signed)
Addressed through result notes  

## 2020-03-21 NOTE — Telephone Encounter (Signed)
Pt returned your call he will be home all afternoon  Best number [phone]

## 2020-04-09 ENCOUNTER — Encounter (HOSPITAL_COMMUNITY): Payer: Self-pay

## 2020-04-09 ENCOUNTER — Other Ambulatory Visit: Payer: Self-pay

## 2020-04-09 ENCOUNTER — Emergency Department (HOSPITAL_COMMUNITY)
Admission: EM | Admit: 2020-04-09 | Discharge: 2020-04-09 | Disposition: A | Discharge status: Home / Self Care | Payer: Medicare Other | Attending: Emergency Medicine | Admitting: Emergency Medicine

## 2020-04-09 DIAGNOSIS — R319 Hematuria, unspecified: Secondary | ICD-10-CM | POA: Insufficient documentation

## 2020-04-09 DIAGNOSIS — I2510 Atherosclerotic heart disease of native coronary artery without angina pectoris: Secondary | ICD-10-CM | POA: Diagnosis not present

## 2020-04-09 DIAGNOSIS — Z951 Presence of aortocoronary bypass graft: Secondary | ICD-10-CM | POA: Diagnosis not present

## 2020-04-09 DIAGNOSIS — Z85828 Personal history of other malignant neoplasm of skin: Secondary | ICD-10-CM | POA: Diagnosis not present

## 2020-04-09 DIAGNOSIS — I10 Essential (primary) hypertension: Secondary | ICD-10-CM | POA: Diagnosis not present

## 2020-04-09 DIAGNOSIS — Z87891 Personal history of nicotine dependence: Secondary | ICD-10-CM | POA: Insufficient documentation

## 2020-04-09 DIAGNOSIS — Z7982 Long term (current) use of aspirin: Secondary | ICD-10-CM | POA: Diagnosis not present

## 2020-04-09 DIAGNOSIS — Z79899 Other long term (current) drug therapy: Secondary | ICD-10-CM | POA: Insufficient documentation

## 2020-04-09 LAB — BASIC METABOLIC PANEL
Anion gap: 9 (ref 5–15)
BUN: 13 mg/dL (ref 8–23)
CO2: 28 mmol/L (ref 22–32)
Calcium: 9.1 mg/dL (ref 8.9–10.3)
Chloride: 100 mmol/L (ref 98–111)
Creatinine, Ser: 0.96 mg/dL (ref 0.61–1.24)
GFR calc non Af Amer: >60 mL/min (ref >60)
Glucose, Bld: 107 mg/dL — ABNORMAL HIGH (ref 70–99)
Potassium: 3.5 mmol/L (ref 3.5–5.1)
Sodium: 137 mmol/L (ref 135–145)

## 2020-04-09 LAB — URINALYSIS, ROUTINE W REFLEX MICROSCOPIC
Bilirubin Urine: NEGATIVE
Glucose, UA: NEGATIVE mg/dL
Ketones, ur: NEGATIVE mg/dL
Leukocytes,Ua: NEGATIVE
Nitrite: NEGATIVE
Protein, ur: NEGATIVE mg/dL
RBC / HPF: >50 RBC/hpf — ABNORMAL HIGH (ref 0–5)
Specific Gravity, Urine: 1.01 (ref 1.005–1.030)
pH: 6 (ref 5.0–8.0)

## 2020-04-09 LAB — CBC
HCT: 46.7 % (ref 39.0–52.0)
Hemoglobin: 15.6 g/dL (ref 13.0–17.0)
MCH: 30.4 pg (ref 26.0–34.0)
MCHC: 33.4 g/dL (ref 30.0–36.0)
MCV: 90.9 fL (ref 80.0–100.0)
Platelets: 225 10*3/uL (ref 150–400)
RBC: 5.14 MIL/uL (ref 4.22–5.81)
RDW: 12.3 % (ref 11.5–15.5)
WBC: 7.1 10*3/uL (ref 4.0–10.5)
nRBC: 0 % (ref 0.0–0.2)

## 2020-04-09 NOTE — ED Triage Notes (Signed)
Pt presents to ED with hematuria today and passed blood clot and other tissue when voiding. PT brought sample in. Pt reports this has been going on intermittently, and was negative for kidney scan ~ 3 months ago at the [place]. PT denies anticoagulant

## 2020-04-09 NOTE — Discharge Instructions (Addendum)
Follow[facility]up with [facility] if you are unable to get in with the [place].  Return if you are having difficulty urinating or begin to feel lightheaded or dizzy.

## 2020-04-09 NOTE — ED Provider Notes (Signed)
[facility]ERGENCY DEPARTMENT Provider Note   CSN: [id number] Arrival date & time: 04/09/20  1608     History Chief Complaint  Patient presents with  . Hematuria    VALGENE DELOATCH is a 84 y.o. male.  HPI Patient presents with hematuria and passing a blood clot.  Has had around 3 weeks of symptoms although today was first day with blood clot.  Comes and goes.  Not lightheadedness or dizziness.  Not painful.  States a few months ago he had a CT scan which did not show a kidney stone.  Has had recent pleural effusions however.  Not on anticoagulation. no fevers or chills.  Feels as if he is urinating fully.  Is seen at the [place] primarily.    Past Medical History:  Diagnosis Date  . Arthritis   . Bifascicular block   . Coronary artery disease    Post CABG in 2001 with patent grafts in 2010  (90% mid LAD stenosis with competing flow from LIMA distally, 90% OM stenosis with competing flow from SVG,  90% RCA stenosis with competing flow from SVG, occluded SVG to Posterolateral branch).  . Gastritis   . GERD (gastroesophageal reflux disease)   . History of nephrolithiasis   . Hyperlipidemia   . Hypertension   . Partial tear of rotator cuff(726.13)    Right  . Psoriasis     Patient Active Problem List   Diagnosis Date Noted  . Status post thoracentesis   . Gallstones 12/22/2019  . Cancer of skin of right ear 12/22/2019  . Dysphagia 12/22/2019  . Pleural effusion 12/14/2019  . Loculated pleural effusion 12/13/2019  . Dizziness 08/30/2018  . Routine general medical examination at a health care facility 11/09/2016  . Hernia, inguinal 06/06/2015  . History of fall 11/09/2014  . Encounter for Medicare annual wellness exam 11/06/2013  . Prostate cancer screening 05/09/2013  . Prediabetes 07/31/2011  . HTN (hypertension) 06/16/2011  . Night muscle spasms 01/21/2011  . Low back pain 01/16/2010  . CAD, AUTOLOGOUS BYPASS GRAFT 11/07/2008  .  FLATULENCE-GAS-BLOATING 08/02/2008  . URINARY CALCULUS 10/17/2007  . Coronary atherosclerosis 10/03/2007  . Hyperlipidemia 04/13/2007  . Hearing loss 04/13/2007  . RHINITIS, CHRONIC 04/13/2007  . GERD 04/13/2007  . Actinic keratosis 04/13/2007  . NEPHROLITHIASIS, HX OF 04/13/2007    Past Surgical History:  Procedure Laterality Date  . BACK SURGERY    . CARDIAC CATHETERIZATION  01/2000   Multi vessel PTCA  . CARDIAC CATHETERIZATION  11/2008   multi vesstel dz, re occlusion small vessel  . CORONARY ARTERY BYPASS GRAFT  2001  . CT ABD W & PELVIS WO CM  09/2001   Kidney stones  . CT ABD WO/W & PELVIS WO CM  10/14/2007   Low grade obstruction on R by 30mm prox ereteral calculus, Sm bilat renal calculi sm HH  . Diffuision of unstable angina  03/2000  . ESOPHAGOGASTRODUODENOSCOPY  08/2008   Mild gastritis and GERD  . INGUINAL HERNIA REPAIR Right 09/10/2015   Procedure: RIGHT INGUINAL HERNIA REPAIR WITH MESH;  Surgeon: Coralie Keens, MD;  Location: [facility];  Service: General;  Laterality: Right;  . INSERTION OF MESH Right 09/10/2015   Procedure: INSERTION OF MESH;  Surgeon: Coralie Keens, MD;  Location: [facility];  Service: General;  Laterality: Right;  . KIDNEY STONE SURGERY  10/2007   Removal R ureteral kidney stone  . NASAL SINUS SURGERY  04/2002  . NECK SURGERY    .  Pelvic CT  10/14/2007   nml  . PTCA  01/2000   Subendocardial MI  . Stress cardiolite  10/2003   No acute changes  . THORACENTESIS N/A 01/04/2020   Procedure: Mathews Robinsons;  Surgeon: Candee Furbish, MD;  Location: [facility];  Service: Pulmonary;  Laterality: N/A;  . TONSILLECTOMY         Family History  Problem Relation Age of Onset  . Transient ischemic attack Mother   . Coronary artery disease Mother   . Heart disease Mother        CAD  . Alcohol abuse Father   . Diabetes type II Father   . Diabetes Father   . Heart disease Father        pacemaker    Social History   Tobacco Use  . Smoking  status: Former Smoker    Packs/day: 1.50    Years: 50.00    Pack years: 75.00    Quit date: 12/05/1999    Years since quitting: 20.3  . Smokeless tobacco: Never Used  Vaping Use  . Vaping Use: Never used  Substance Use Topics  . Alcohol use: No    Alcohol/week: 0.0 standard drinks  . Drug use: No    Home Medications Prior to Admission medications   Medication Sig Start Date End Date Taking? Authorizing Provider  amLODipine (NORVASC) 5 MG tablet Take 1 tablet (5 mg total) by mouth daily. 12/15/19 03/15/21  Mercy Riding, MD  aspirin EC 81 MG tablet Take 1 tablet (81 mg total) by mouth daily. 11/24/16   Tower, Wynelle Fanny, MD  fluorouracil (EFUDEX) 5 % cream Apply topically every 2 (two) hours while awake.    [provider]  hydrochlorothiazide (HYDRODIURIL) 25 MG tablet Take 1 tablet (25 mg total) by mouth daily. 12/15/19   Mercy Riding, MD  hydrophilic ointment Apply topically as needed for dry skin. For use with Fluorouracil    [provider]  Lactobacillus (PROBIOTIC ACIDOPHILUS) CAPS Take 1 capsule by mouth in the morning and at bedtime. 12/15/19   Mercy Riding, MD  latanoprost (XALATAN) 0.005 % ophthalmic solution Place 1 drop into both eyes at bedtime.    [provider]  Multiple Vitamin (MULTIVITAMIN) tablet Take 1 tablet by mouth daily.      [provider]  Omega-3 Fatty Acids (FISH OIL) 1000 MG CAPS Take 1 capsule by mouth daily.    [provider]    Allergies    Patient has no known allergies.  Review of Systems   Review of Systems  Constitutional: Negative for appetite change.  HENT: Negative for congestion.   Respiratory: Negative for shortness of breath.   Gastrointestinal: Negative for abdominal pain.  Genitourinary: Positive for hematuria. Negative for difficulty urinating.  Musculoskeletal: Negative for back pain.  Neurological: Negative for weakness.  Psychiatric/Behavioral: Negative for confusion.    Physical  Exam Updated Vital Signs BP (!) 161/84 (BP Location: Right Arm)   Pulse (!) 55   Temp 98.4 F (36.9 C) (Oral)   Resp 16   SpO2 100%   Physical Exam Vitals and nursing note reviewed.  HENT:     Head: Normocephalic.  Eyes:     Pupils: Pupils are equal, round, and reactive to light.  Pulmonary:     Breath sounds: No wheezing or rhonchi.  Abdominal:     Tenderness: There is no abdominal tenderness.  Genitourinary:    Penis: Normal.      Comments: No blood  at the meatus.  No CVA tenderness. Skin:    Capillary Refill: Capillary refill takes less than 2 seconds.  Neurological:     Mental Status: He is alert and oriented to person, place, and time.     ED Results / Procedures / Treatments   Labs (all labs ordered are listed, but only abnormal results are displayed) Labs Reviewed  URINALYSIS, ROUTINE W REFLEX MICROSCOPIC - Abnormal; Notable for the following components:      Result Value   APPearance HAZY (*)    Hgb urine dipstick LARGE (*)    RBC / HPF >50 (*)    Bacteria, UA RARE (*)    All other components within normal limits  BASIC METABOLIC PANEL - Abnormal; Notable for the following components:   Glucose, Bld 107 (*)    All other components within normal limits  CBC    EKG None  Radiology No results found.  Procedures Procedures (including critical care time)  Medications Ordered in ED Medications - No data to display  ED Course  I have reviewed the triage vital signs and the nursing notes.  Pertinent labs & imaging results that were available during my care of the patient were reviewed by me and considered in my medical decision making (see chart for details).    MDM Rules/Calculators/A&P                          Patient with hematuria.  No UTI.  Lab work reassuring.  No anemia.  Good kidney function.  Recent CT scan without stone.  Patient appears stable for discharge.  Urology follow-up either through the [facility] or through [facility].  Will  discharge home.  Discussed with patient and his daughter. Final Clinical Impression(s) / ED Diagnoses Final diagnoses:  Hematuria, unspecified type    Rx / DC Orders ED Discharge Orders    None       Davonna Belling, MD 04/09/20 2046

## 2020-07-11 ENCOUNTER — Other Ambulatory Visit: Payer: Self-pay

## 2020-07-11 ENCOUNTER — Encounter: Payer: Self-pay | Admitting: Emergency Medicine

## 2020-07-11 ENCOUNTER — Ambulatory Visit (INDEPENDENT_AMBULATORY_CARE_PROVIDER_SITE_OTHER): Payer: Medicare Other | Admitting: Emergency Medicine

## 2020-07-11 DIAGNOSIS — J90 Pleural effusion, not elsewhere classified: Secondary | ICD-10-CM | POA: Diagnosis not present

## 2020-07-11 DIAGNOSIS — J449 Chronic obstructive pulmonary disease, unspecified: Secondary | ICD-10-CM | POA: Diagnosis not present

## 2020-07-11 DIAGNOSIS — R918 Other nonspecific abnormal finding of lung field: Secondary | ICD-10-CM

## 2020-07-11 DIAGNOSIS — R9389 Abnormal findings on diagnostic imaging of other specified body structures: Secondary | ICD-10-CM | POA: Insufficient documentation

## 2020-07-11 MED ORDER — SPIRIVA RESPIMAT 2.5 MCG/ACT IN AERS: 2.0000 | INHALATION_SPRAY | Freq: Every day | RESPIRATORY_TRACT | 0 refills | Status: DC

## 2020-07-11 NOTE — Progress Notes (Signed)
Subjective:    Patient ID: Joel [name], male    DOB: [birth year], 85 y.o.   MRN: [id number]  HPI 85 year old gentleman former smoker (60 pack years) with a history of CAD/CABG, hypertension, hyperlipidemia, GERD, dysphagia.  He was seen in June 2021 while hospitalized for loculated right pleural effusion that was drained with a pigtail catheter.  Found to be an exudate, culture negative cytology negative with mesothelial cells, 65% eosinophils.  He was treated empirically with antibiotics.  Seen again 6/30 with chest discomfort pleuritic in nature.  Chest x-ray showed small loculated right effusion with bibasilar atelectasis at that visit.  He underwent a repeat thoracentesis on 01/04/2020, labs 0% eosinophils, culture negative, protein 3.2, LDH 141, cholesterol 65, pH 7.8. He is currently under evaluation for bladder tumor with planned transurethral resection 07/31/2020. He went for his procedure originally on 1/4, but it was postponed because he had acute dyspnea, ? A component panic. He has thick mucous, chronic, some difficulty clearing. He hears wheeze off and on. He reports that he has bronchitis about once a year and requires an antibiotic.   CT scan of the chest performed on 07/04/2020 at the [facility] available for review, I have reviewed, shows a very small loculated right effusion along the lateral chest wall, unchanged chronic pleural thickening compared with prior scan done 02/29/2020 (based on report) mild chronic basilar atelectasis.  Scattered calcified granulomas and small pulmonary nodules with some possible interval increase in size and to of the right upper lobe nodules, 4 mm and 6 mm.   Review of Systems As per HPI  Past Medical History:  Diagnosis Date  . Arthritis   . Bifascicular block   . Coronary artery disease    Post CABG in 2001 with patent grafts in 2010  (90% mid LAD stenosis with competing flow from LIMA distally, 90% OM stenosis with competing flow from SVG,  90% RCA  stenosis with competing flow from SVG, occluded SVG to Posterolateral branch).  . Gastritis   . GERD (gastroesophageal reflux disease)   . History of nephrolithiasis   . Hyperlipidemia   . Hypertension   . Partial tear of rotator cuff(726.13)    Right  . Psoriasis      Family History  Problem Relation Age of Onset  . Transient ischemic attack Mother   . Coronary artery disease Mother   . Heart disease Mother        CAD  . Alcohol abuse Father   . Diabetes type II Father   . Diabetes Father   . Heart disease Father        pacemaker     Social History   Socioeconomic History  . Marital status: Widowed    Spouse name: Not on file  . Number of children: Not on file  . Years of education: Not on file  . Highest education level: Not on file  Occupational History  . Occupation: Retired Dispensing optician work    Fish farm manager: RETIRED  . Occupation: Therapist, art x 4 years  Tobacco Use  . Smoking status: Former Smoker    Packs/day: 1.50    Years: 50.00    Pack years: 75.00    Quit date: 12/05/1999    Years since quitting: 20.6  . Smokeless tobacco: Never Used  Vaping Use  . Vaping Use: Never used  Substance and Sexual Activity  . Alcohol use: No    Alcohol/week: 0.0 standard drinks  . Drug use: No  . Sexual activity: Not Currently  Other Topics Concern  . Not on file  Social History Narrative   1 half brother, 3 half sisters   Wife died last year of lung cancer   2 children   Fishes and walks dog for exercise      Probation officer Release form and gives Leane Para, daughter [phone]. Can leave msg on ans machine   Social Determinants of Health   Financial Resource Strain: Not on file  Food Insecurity: Not on file  Transportation Needs: Not on file  Physical Activity: Not on file  Stress: Not on file  Social Connections: Not on file  Intimate Partner Violence: Not on file    Has worked as a Scientist, water quality Was in the [employer], worked as a Administrator Known asbestos From [employer].    No Known Allergies   Outpatient Medications Prior to Visit  Medication Sig Dispense Refill  . amLODipine (NORVASC) 5 MG tablet Take 1 tablet (5 mg total) by mouth daily. 30 tablet 1  . b complex vitamins capsule Take 1 capsule by mouth daily.    . carvedilol (COREG) 6.25 MG tablet Take by mouth.    . hydrochlorothiazide (HYDRODIURIL) 25 MG tablet Take 1 tablet (25 mg total) by mouth daily. 90 tablet 1  . hydrophilic ointment Apply topically as needed for dry skin. For use with Fluorouracil    . Lactobacillus (PROBIOTIC ACIDOPHILUS) CAPS Take 1 capsule by mouth in the morning and at bedtime. 30 capsule 0  . latanoprost (XALATAN) 0.005 % ophthalmic solution Place 1 drop into both eyes at bedtime.    . Magnesium Oxide 420 MG TABS Take by mouth.    . Multiple Vitamin (MULTIVITAMIN) tablet Take 1 tablet by mouth daily.    . Omega-3 Fatty Acids (FISH OIL) 1000 MG CAPS Take 1 capsule by mouth daily.    Marland Kitchen aspirin EC 81 MG tablet Take 1 tablet (81 mg total) by mouth daily. 90 tablet 3  . fluorouracil (EFUDEX) 5 % cream Apply topically every 2 (two) hours while awake.     No facility-administered medications prior to visit.        Objective:   Physical Exam Vitals:   07/11/20 0948  BP: 122/74  Pulse: 76  Temp: (!) 97.5 F (36.4 C)  TempSrc: Temporal  SpO2: 95%  Weight: 190 lb 9.6 oz (86.5 kg)  Height: 5\' 10"  (1.778 m)    Gen: Pleasant, elderly man, in no distress,  normal affect  ENT: No lesions,  mouth clear,  oropharynx clear, no postnasal drip  Neck: No JVD, no stridor  Lungs: No use of accessory muscles, no crackles or wheezing on normal respiration, very slight wheeze on a forced expiration at end expiration.  Cardiovascular: RRR, heart sounds normal, no murmur or gallops, no peripheral edema  Musculoskeletal: No deformities, no cyanosis or clubbing  Neuro: alert, awake, poor hearing, non-focal  Skin: Warm, no lesions or rash      Assessment & Plan:  COPD  (chronic obstructive pulmonary disease) (HCC) No PFT currently available to review but based on clinical history, tobacco history suspect that he does have significant COPD.  He may need PFTs but for now in preparation for his urology procedure I think it makes the most sense to start him empirically on scheduled bronchodilator therapy to try and optimize function.  We will start Spiriva Respimat 2 puffs once daily.  He has COPD and other medical issues placing him at moderate to high risk for anesthesia and surgery  but do not preclude his procedure since the benefits outweigh these risks.  I discussed this with him the day.  I do want him to take the Spiriva all the way up to the planned surgery on 07/31/2020.  Loculated pleural effusion Very small residual loculated focus peripheral aspect of the right lung.  This is not large enough to be impacting his breathing, does not need to be drained.  Pulmonary nodules/lesions, multiple Small pulmonary nodules ranging 3-6 mm, some of which are calcified.  Etiology unclear.  He is at risk for malignancy both because of his tobacco history and his newly diagnosed bladder cancer.  He needs surveillance CT, next to be done in 6 months.  This can be done either in the [facility] system or at the [place].  I want him to review the results with a determine whether he needs any diagnostic procedure.   Baltazar Apo, MD, PhD 07/11/2020, 11:40 AM [facility] [phone] or if no answer [phone]

## 2020-07-11 NOTE — Assessment & Plan Note (Signed)
No PFT currently available to review but based on clinical history, tobacco history suspect that he does have significant COPD.  He may need PFTs but for now in preparation for his urology procedure I think it makes the most sense to start him empirically on scheduled bronchodilator therapy to try and optimize function.  We will start Spiriva Respimat 2 puffs once daily.  He has COPD and other medical issues placing him at moderate to high risk for anesthesia and surgery but do not preclude his procedure since the benefits outweigh these risks.  I discussed this with him the day.  I do want him to take the Spiriva all the way up to the planned surgery on 07/31/2020.

## 2020-07-11 NOTE — Addendum Note (Signed)
Addended by: Maurene Capes on: 07/11/2020 03:00 PM   Modules accepted: Orders

## 2020-07-11 NOTE — Assessment & Plan Note (Signed)
Very small residual loculated focus peripheral aspect of the right lung.  This is not large enough to be impacting his breathing, does not need to be drained.

## 2020-07-11 NOTE — Patient Instructions (Addendum)
We will try starting an every day inhaler medication called Spiriva.  Use 2 puffs once a day every day at the same time.  Keep track of whether this helps her breathing. There is a small residual pocket of fluid next to the right lung.  This does not need to be drained. Your CT scan of the chest shows small scattered pulmonary nodules.  You will need a repeat CT chest without contrast in July 2022 to follow for stability.  We can arrange to do this either in the [facility] system or at the [place]. Continue to practice your swallowing precautions You are at moderate increased risk for surgery due to suspected COPD.  This does not mean you cannot proceed with surgery.  We will work on optimizing your breathing prior to your procedure by starting the inhaler as above. Follow with Dr Delton Coombes in 1 month or next available so that we can talk about whether the inhaled medication has helped you.

## 2020-07-11 NOTE — Assessment & Plan Note (Signed)
Small pulmonary nodules ranging 3-6 mm, some of which are calcified.  Etiology unclear.  He is at risk for malignancy both because of his tobacco history and his newly diagnosed bladder cancer.  He needs surveillance CT, next to be done in 6 months.  This can be done either in the [facility] system or at the [place].  I want him to review the results with a determine whether he needs any diagnostic procedure.

## 2020-07-19 ENCOUNTER — Telehealth: Payer: Self-pay | Admitting: Emergency Medicine

## 2020-07-19 NOTE — Telephone Encounter (Signed)
Pt's OV notes has been faxed to provided fax number by [facility] with [facility]. Nothing further needed.

## 2020-08-08 ENCOUNTER — Ambulatory Visit (INDEPENDENT_AMBULATORY_CARE_PROVIDER_SITE_OTHER): Payer: No Typology Code available for payment source | Admitting: Emergency Medicine

## 2020-08-08 ENCOUNTER — Other Ambulatory Visit: Payer: Self-pay

## 2020-08-08 ENCOUNTER — Encounter: Payer: Self-pay | Admitting: Emergency Medicine

## 2020-08-08 DIAGNOSIS — J449 Chronic obstructive pulmonary disease, unspecified: Secondary | ICD-10-CM | POA: Diagnosis not present

## 2020-08-08 DIAGNOSIS — R918 Other nonspecific abnormal finding of lung field: Secondary | ICD-10-CM | POA: Diagnosis not present

## 2020-08-08 DIAGNOSIS — J310 Chronic rhinitis: Secondary | ICD-10-CM | POA: Diagnosis not present

## 2020-08-08 DIAGNOSIS — J90 Pleural effusion, not elsewhere classified: Secondary | ICD-10-CM | POA: Diagnosis not present

## 2020-08-08 MED ORDER — GUAIFENESIN ER 600 MG PO TB12: 600.0000 mg | ORAL_TABLET | Freq: Two times a day (BID) | ORAL | 3 refills | Status: DC

## 2020-08-08 MED ORDER — LORATADINE 10 MG PO TABS: 10.0000 mg | ORAL_TABLET | Freq: Every day | ORAL | 11 refills | Status: DC

## 2020-08-08 MED ORDER — ALBUTEROL SULFATE HFA 108 (90 BASE) MCG/ACT IN AERS: 2.0000 | INHALATION_SPRAY | Freq: Four times a day (QID) | RESPIRATORY_TRACT | 6 refills | Status: AC | PRN

## 2020-08-08 MED ORDER — SPIRIVA RESPIMAT 2.5 MCG/ACT IN AERS: 2.0000 | INHALATION_SPRAY | Freq: Every day | RESPIRATORY_TRACT | 0 refills | Status: DC

## 2020-08-08 NOTE — Patient Instructions (Addendum)
We need to repeat your CT scan of the chest in June 2022 to follow small pulmonary nodules, small residual fluid collection. Continue Spiriva 2 puffs once daily. Keep albuterol available use 2 puffs if needed shortness of breath, chest tightness, wheezing. Start guaifenesin (Mucinex) 600 mg twice a day Start loratadine (Claritin) 10 mg once a day We will perform pulmonary function testing in next office visit Follow with Dr. Lamonte Sakai in June after your CT scan and with PFT on the same day.

## 2020-08-08 NOTE — Progress Notes (Signed)
Subjective:    Patient ID: Joel [name], male    DOB: Sep 01, 1935, 84 y.o.   MRN: [id number]  HPI 85 year old gentleman former smoker (60 pack years) with a history of CAD/CABG, hypertension, hyperlipidemia, GERD, dysphagia.  He was seen in June 2021 while hospitalized for loculated right pleural effusion that was drained with a pigtail catheter.  Found to be an exudate, culture negative cytology negative with mesothelial cells, 65% eosinophils.  He was treated empirically with antibiotics.  Seen again 6/30 with chest discomfort pleuritic in nature.  Chest x-ray showed small loculated right effusion with bibasilar atelectasis at that visit.  He underwent a repeat thoracentesis on 01/04/2020, labs 0% eosinophils, culture negative, protein 3.2, LDH 141, cholesterol 65, pH 7.8. He is currently under evaluation for bladder tumor with planned transurethral resection 07/31/2020. He went for his procedure originally on 1/4, but it was postponed because he had acute dyspnea, ? A component panic. He has thick mucous, chronic, some difficulty clearing. He hears wheeze off and on. He reports that he has bronchitis about once a year and requires an antibiotic.   CT scan of the chest performed on 07/04/2020 at the [facility] available for review, I have reviewed, shows a very small loculated right effusion along the lateral chest wall, unchanged chronic pleural thickening compared with prior scan done 02/29/2020 (based on report) mild chronic basilar atelectasis.  Scattered calcified granulomas and small pulmonary nodules with some possible interval increase in size and to of the right upper lobe nodules, 4 mm and 6 mm.  ROV 08/08/20 --this follow-up visit for 85 year old gentleman with CAD/CABG, hypertension, hyperlipidemia and presumed significant COPD based on history of tobacco use.  He had an exudative loculated right pleural effusion drained 12/2019, cytology negative with 65% eosinophils, repeated 01/04/2020 with probable  low-grade exudate.  All cytologies have been negative.  Also with bilateral scattered small pulmonary nodules some of which are calcified which we have plan to follow with a repeat CT in June 2022.  He has a new bladder tumor under evaluation by Dr. Warrick Parisian with urology.  This was going to be done on 1/26, but it was canceled. It looks like it is rescheduled for 08/14/20.   I added Spiriva Respimat 1/6 to see if he would get benefit.  He reports that he has benefited some - is coughing less, although he still has some PND leading to cough. Can wake him from sleep. Mucous is thick that he coughs up, difficult to clear. He is able to walk - can have some SOB when he walks / feeds dog, walks about 556ft. He has tried [facility] before, doesn't like it. Has seen some epistaxis.   MDM: Reviewed anesthesia notes from 1/26   Review of Systems As per HPI      Objective:   Physical Exam Vitals:   08/08/20 1338  BP: (!) 142/80  Pulse: 79  Temp: (!) 97.3 F (36.3 C)  SpO2: 97%  Weight: 188 lb 3.2 oz (85.4 kg)  Height: 5' 10.5" (1.791 m)    Gen: Pleasant, elderly man, in no distress,  normal affect  ENT: No lesions,  mouth clear,  oropharynx clear, no postnasal drip  Neck: No JVD, no stridor  Lungs: No use of accessory muscles, bilateral exp wheezes.   Cardiovascular: RRR, heart sounds normal, no murmur or gallops, no peripheral edema  Musculoskeletal: No deformities, no cyanosis or clubbing  Neuro: alert, awake, poor hearing, non-focal  Skin: Warm, no lesions or rash  Assessment & Plan:  COPD (chronic obstructive pulmonary disease) (HCC) Continue Spiriva 2 puffs once daily. Keep albuterol available use 2 puffs if needed shortness of breath, chest tightness, wheezing. Start guaifenesin (Mucinex) 600 mg twice a day We will perform pulmonary function testing in next office visit Follow with Dr. Lamonte Sakai in June after your CT scan and with PFT on the same day.  Loculated pleural  effusion Small residual loculated pleural effusion seen on his CT scan from December.  Not enough to drain.  Plan to follow with serial imaging  RHINITIS, CHRONIC Start loratadine.  He did not tolerate/like fluticasone nasal spray in the past.  Pulmonary nodules/lesions, multiple Repeat CT chest June/2022 to follow for interval stability.   Baltazar Apo, MD, PhD 08/08/2020, 1:57 PM [facility] [phone] or if no answer [phone]

## 2020-08-08 NOTE — Assessment & Plan Note (Signed)
Repeat CT chest June/2022 to follow for interval stability.

## 2020-08-08 NOTE — Addendum Note (Signed)
Addended by: Gavin Potters R on: 08/08/2020 02:12 PM   Modules accepted: Orders

## 2020-08-08 NOTE — Assessment & Plan Note (Signed)
Start loratadine.  He did not tolerate/like fluticasone nasal spray in the past.

## 2020-08-08 NOTE — Assessment & Plan Note (Signed)
Small residual loculated pleural effusion seen on his CT scan from December.  Not enough to drain.  Plan to follow with serial imaging

## 2020-08-08 NOTE — Assessment & Plan Note (Signed)
Continue Spiriva 2 puffs once daily. Keep albuterol available use 2 puffs if needed shortness of breath, chest tightness, wheezing. Start guaifenesin (Mucinex) 600 mg twice a day We will perform pulmonary function testing in next office visit Follow with Dr. Lamonte Sakai in June after your CT scan and with PFT on the same day.

## 2020-08-12 ENCOUNTER — Telehealth: Payer: Self-pay | Admitting: Emergency Medicine

## 2020-08-12 MED ORDER — SPIRIVA RESPIMAT 2.5 MCG/ACT IN AERS: 2.0000 | INHALATION_SPRAY | Freq: Every day | RESPIRATORY_TRACT | 9 refills | Status: AC

## 2020-08-12 NOTE — Telephone Encounter (Signed)
Patient was just seen 08/08/20 and RB states to continue Spiriva Refill sent Nothing further needed at this time.

## 2020-09-23 ENCOUNTER — Telehealth: Payer: Self-pay | Admitting: Emergency Medicine

## 2020-09-23 DIAGNOSIS — R918 Other nonspecific abnormal finding of lung field: Secondary | ICD-10-CM

## 2020-09-23 DIAGNOSIS — R222 Localized swelling, mass and lump, trunk: Secondary | ICD-10-CM

## 2020-09-23 NOTE — Telephone Encounter (Signed)
Left message for Kathy to call back

## 2020-09-24 NOTE — Telephone Encounter (Signed)
Please try to get copies (a disc) of the [place] CT chest and abd done in February. I agree that he needs a repeat superD Ct chest without contrast now (in our system) to follow pulmonary nodules and pleural nodules. He needs an OV w me ASAP after the local CT to review and decide whether we need to do further evaluation. OK to use one of my blocked nodule spots to get him scheduled.

## 2020-09-24 NOTE — Telephone Encounter (Addendum)
Called and spoke to pt's daughter, [name]. She states the pt was diagnosed with stage 4 bladder cancer about a month ago. Pt has had an abdominal CT and chest CT since last seeing Dr. Lamonte Sakai. [name] states there were some noted changes of pt's lung nodules and is questioning what needs to be done. Pt's scans were done through [facility]. Pt is seeing an oncologist at [facility].  If images are in not in PACS and pt needs to have another scan he does not want to go through the [place], he would like scan done through [facility]. Pt states [facility] advised he wouldn't need to have it done through the [place] as the authorization is still valid.   Will forward to Dr. Lamonte Sakai to advise further.

## 2020-09-24 NOTE — Telephone Encounter (Signed)
Called and spoke with daughter, [name], listed on the DPR regarding super D CT scan and OV with Dr. Lamonte Sakai.  Requested that daughter contact [facility] to get a copy of the CT chest.  She stated she would call them to get it.  Scheduled next available with Dr. Lamonte Sakai 10/08/20 at 2 pm, advised to arrive by 1:45 pm for check in.  She verbalized understanding.

## 2020-09-28 ENCOUNTER — Other Ambulatory Visit: Payer: Self-pay

## 2020-09-28 ENCOUNTER — Emergency Department (HOSPITAL_COMMUNITY)
Admission: EM | Admit: 2020-09-28 | Discharge: 2020-09-28 | Disposition: A | Discharge status: Home / Self Care | Payer: No Typology Code available for payment source | Attending: Emergency Medicine | Admitting: Emergency Medicine

## 2020-09-28 ENCOUNTER — Encounter (HOSPITAL_COMMUNITY): Payer: Self-pay

## 2020-09-28 DIAGNOSIS — I2510 Atherosclerotic heart disease of native coronary artery without angina pectoris: Secondary | ICD-10-CM | POA: Diagnosis not present

## 2020-09-28 DIAGNOSIS — C4490 Unspecified malignant neoplasm of skin, unspecified: Secondary | ICD-10-CM | POA: Diagnosis not present

## 2020-09-28 DIAGNOSIS — Z87891 Personal history of nicotine dependence: Secondary | ICD-10-CM | POA: Diagnosis not present

## 2020-09-28 DIAGNOSIS — R220 Localized swelling, mass and lump, head: Secondary | ICD-10-CM | POA: Diagnosis present

## 2020-09-28 DIAGNOSIS — Z951 Presence of aortocoronary bypass graft: Secondary | ICD-10-CM | POA: Diagnosis not present

## 2020-09-28 DIAGNOSIS — Z79899 Other long term (current) drug therapy: Secondary | ICD-10-CM | POA: Insufficient documentation

## 2020-09-28 DIAGNOSIS — G893 Neoplasm related pain (acute) (chronic): Secondary | ICD-10-CM | POA: Diagnosis not present

## 2020-09-28 DIAGNOSIS — I10 Essential (primary) hypertension: Secondary | ICD-10-CM | POA: Insufficient documentation

## 2020-09-28 DIAGNOSIS — R519 Headache, unspecified: Secondary | ICD-10-CM | POA: Insufficient documentation

## 2020-09-28 DIAGNOSIS — J449 Chronic obstructive pulmonary disease, unspecified: Secondary | ICD-10-CM | POA: Insufficient documentation

## 2020-09-28 HISTORY — DX: Personal history of malignant neoplasm of bladder: Z85.51

## 2020-09-28 MED ORDER — DOXYCYCLINE HYCLATE 100 MG PO CAPS: 100.0000 mg | ORAL_CAPSULE | Freq: Two times a day (BID) | ORAL | 0 refills | Status: DC

## 2020-09-28 MED ORDER — DOXYCYCLINE HYCLATE 100 MG PO TABS
100.0000 mg | ORAL_TABLET | Freq: Once | ORAL | Status: AC
  Administered 2020-09-28: 100 mg via ORAL
  Filled 2020-09-28: qty 1

## 2020-09-28 NOTE — ED Triage Notes (Signed)
Thursday had 6 skin CA lesions removed and this morning woke up with right eye swelling.  Right eye is with good amount of swelling.  Pt is still able to see from the right eye c/o of mild blurred vision.

## 2020-09-28 NOTE — Discharge Instructions (Addendum)
[facility].  The swelling around the eye is probably just edema fluid.  Just in case there is early evidence of an infection on the scalp area going take the doxycycline as directed for the next 7 days.  Return for any new or worse symptoms

## 2020-09-28 NOTE — ED Provider Notes (Signed)
[facility] EMERGENCY DEPARTMENT Provider Note   CSN: [id number] Arrival date & time: 09/28/20  1029     History Chief Complaint  Patient presents with  . Facial Swelling    Joel [name] is a 85 y.o. male.  Patient followed by [facility].  Patient had skin cancers cauterized on top of his scalp.  And some on his arms and hands.  On Thursday.  The concern is is that he has swelling around his right eye.  Upper eyelid and lower eyelid.  No visual changes.  Still has the dressings on from the skin cancer removal.  He is able to take them off today.  Patient with no fevers.  States she does have some increased pain in the right parietal part of the scalp.  Does not hurt to move his eyes.  No fever.  Swelling around the eye is not not red and is not painful also no itching        Past Medical History:  Diagnosis Date  . Arthritis   . Bifascicular block   . Coronary artery disease    Post CABG in 2001 with patent grafts in 2010  (90% mid LAD stenosis with competing flow from LIMA distally, 90% OM stenosis with competing flow from SVG,  90% RCA stenosis with competing flow from SVG, occluded SVG to Posterolateral branch).  . Gastritis   . GERD (gastroesophageal reflux disease)   . History of bladder cancer    stage 4  . History of nephrolithiasis   . Hyperlipidemia   . Hypertension   . Partial tear of rotator cuff(726.13)    Right  . Psoriasis     Patient Active Problem List   Diagnosis Date Noted  . COPD (chronic obstructive pulmonary disease) ([place]) 07/11/2020  . Pulmonary nodules/lesions, multiple 07/11/2020  . Status post thoracentesis   . Gallstones 12/22/2019  . Cancer of skin of right ear 12/22/2019  . Dysphagia 12/22/2019  . Pleural effusion 12/14/2019  . Loculated pleural effusion 12/13/2019  . Dizziness 08/30/2018  . Routine general medical examination at a health care facility 11/09/2016  . Hernia, inguinal 06/06/2015  . History of fall  11/09/2014  . Encounter for Medicare annual wellness exam 11/06/2013  . Prostate cancer screening 05/09/2013  . Prediabetes 07/31/2011  . HTN (hypertension) 06/16/2011  . Night muscle spasms 01/21/2011  . Low back pain 01/16/2010  . CAD, AUTOLOGOUS BYPASS GRAFT 11/07/2008  . FLATULENCE-GAS-BLOATING 08/02/2008  . URINARY CALCULUS 10/17/2007  . Coronary atherosclerosis 10/03/2007  . Hyperlipidemia 04/13/2007  . Hearing loss 04/13/2007  . RHINITIS, CHRONIC 04/13/2007  . GERD 04/13/2007  . Actinic keratosis 04/13/2007  . NEPHROLITHIASIS, HX OF 04/13/2007    Past Surgical History:  Procedure Laterality Date  . BACK SURGERY    . CARDIAC CATHETERIZATION  01/2000   Multi vessel PTCA  . CARDIAC CATHETERIZATION  11/2008   multi vesstel dz, re occlusion small vessel  . CORONARY ARTERY BYPASS GRAFT  2001  . CT ABD W & PELVIS WO CM  09/2001   Kidney stones  . CT ABD WO/W & PELVIS WO CM  10/14/2007   Low grade obstruction on R by 70mm prox ereteral calculus, Sm bilat renal calculi sm HH  . CYSTOSTOMY W/ BLADDER BIOPSY    . Diffuision of unstable angina  03/2000  . ESOPHAGOGASTRODUODENOSCOPY  08/2008   Mild gastritis and GERD  . INGUINAL HERNIA REPAIR Right 09/10/2015   Procedure: RIGHT INGUINAL HERNIA REPAIR WITH MESH;  Surgeon: Coralie Keens, MD;  Location: [facility];  Service: General;  Laterality: Right;  . INSERTION OF MESH Right 09/10/2015   Procedure: INSERTION OF MESH;  Surgeon: Coralie Keens, MD;  Location: [facility];  Service: General;  Laterality: Right;  . KIDNEY STONE SURGERY  10/2007   Removal R ureteral kidney stone  . NASAL SINUS SURGERY  04/2002  . NECK SURGERY    . Pelvic CT  10/14/2007   nml  . PTCA  01/2000   Subendocardial MI  . SKIN CANCER EXCISION    . Stress cardiolite  10/2003   No acute changes  . THORACENTESIS N/A 01/04/2020   Procedure: Mathews Robinsons;  Surgeon: Candee Furbish, MD;  Location: [facility] ENDOSCOPY;  Service: Pulmonary;  Laterality: N/A;  . TONSILLECTOMY          Family History  Problem Relation Age of Onset  . Transient ischemic attack Mother   . Coronary artery disease Mother   . Heart disease Mother        CAD  . Alcohol abuse Father   . Diabetes type II Father   . Diabetes Father   . Heart disease Father        pacemaker    Social History   Tobacco Use  . Smoking status: Former Smoker    Packs/day: 1.50    Years: 50.00    Pack years: 75.00    Quit date: 12/05/1999    Years since quitting: 20.8  . Smokeless tobacco: Never Used  Vaping Use  . Vaping Use: Never used  Substance Use Topics  . Alcohol use: No    Alcohol/week: 0.0 standard drinks  . Drug use: No    Home Medications Prior to Admission medications   Medication Sig Start Date End Date Taking? Authorizing Provider  doxycycline (VIBRAMYCIN) 100 MG capsule Take 1 capsule (100 mg total) by mouth 2 (two) times daily. 09/28/20  Yes Fredia Sorrow, MD  albuterol (VENTOLIN HFA) 108 (90 Base) MCG/ACT inhaler Inhale 2 puffs into the lungs every 6 (six) hours as needed for wheezing or shortness of breath. 08/08/20   Collene Gobble, MD  amLODipine (NORVASC) 5 MG tablet Take 1 tablet (5 mg total) by mouth daily. 12/15/19 03/15/21  Mercy Riding, MD  b complex vitamins capsule Take 1 capsule by mouth daily. Patient not taking: Reported on 08/08/2020 04/09/20   [provider]  carvedilol (COREG) 6.25 MG tablet Take by mouth. 06/06/20   [provider]  guaiFENesin (MUCINEX) 600 MG 12 hr tablet Take 1 tablet (600 mg total) by mouth 2 (two) times daily. 08/08/20   Collene Gobble, MD  hydrochlorothiazide (HYDRODIURIL) 25 MG tablet Take 1 tablet (25 mg total) by mouth daily. 12/15/19   Mercy Riding, MD  hydrophilic ointment Apply topically as needed for dry skin. For use with Fluorouracil    [provider]  Lactobacillus (PROBIOTIC ACIDOPHILUS) CAPS Take 1 capsule by mouth in the morning and at bedtime. 12/15/19   Mercy Riding, MD  latanoprost (XALATAN)  0.005 % ophthalmic solution Place 1 drop into both eyes at bedtime.    [provider]  loratadine (CLARITIN) 10 MG tablet Take 1 tablet (10 mg total) by mouth daily. 08/08/20   Collene Gobble, MD  Magnesium Oxide 420 MG TABS Take by mouth. Patient not taking: Reported on 08/08/2020 04/09/20   [provider]  Multiple Vitamin (MULTIVITAMIN) tablet Take 1 tablet by mouth daily.    [provider]  Omega-3 Fatty Acids (FISH OIL) 1000 MG CAPS Take 1 capsule by mouth daily.    [provider]  Tiotropium Bromide Monohydrate (SPIRIVA RESPIMAT) 2.5 MCG/ACT AERS Inhale 2 puffs into the lungs daily. 08/12/20   Collene Gobble, MD    Allergies    Drug class [statins]  Review of Systems   Review of Systems  Constitutional: Negative for chills and fever.  HENT: Positive for facial swelling. Negative for rhinorrhea and sore throat.   Eyes: Negative for pain, redness and visual disturbance.  Respiratory: Negative for cough and shortness of breath.   Cardiovascular: Negative for chest pain and leg swelling.  Gastrointestinal: Negative for abdominal pain, diarrhea, nausea and vomiting.  Genitourinary: Negative for dysuria.  Musculoskeletal: Negative for back pain and neck pain.  Skin: Positive for wound. Negative for rash.  Neurological: Negative for dizziness, light-headedness and headaches.  Hematological: Does not bruise/bleed easily.  Psychiatric/Behavioral: Negative for confusion.    Physical Exam Updated Vital Signs BP 139/81   Pulse (!) 44   Temp 98 F (36.7 C) (Oral)   Wt 84.4 kg   SpO2 92%   BMI 26.31 kg/m   Physical Exam Vitals and nursing note reviewed.  Constitutional:      Appearance: Normal appearance. He is well-developed.  HENT:     Head: Normocephalic.     Comments: Patient with 3 cauterized areas on top of his scalp head area.  Dressings removed.  No evidence of any significant infection.  There is evidence of cauterized skin.  Is a  little bit of tenderness lateral to the right side.  There is also some scar edema on the forehead area but that is nontender.  No erythema to the scalp.  No purulent discharge. Eyes:     Conjunctiva/sclera: Conjunctivae normal.     Pupils: Pupils are equal, round, and reactive to light.     Comments: Sclera and conjunctiva are clear.  Swelling to the right upper eyelid and right lower eyelid.  And some into the cheek area.  Some to the lateral part of the face.  No erythema nontender to palpation.  Extraocular muscles intact does not hurt to move the eyes at all.  No visual change.  Cardiovascular:     Rate and Rhythm: Normal rate and regular rhythm.     Heart sounds: No murmur heard.   Pulmonary:     Effort: Pulmonary effort is normal. No respiratory distress.     Breath sounds: Normal breath sounds.  Abdominal:     Palpations: Abdomen is soft.     Tenderness: There is no abdominal tenderness.  Musculoskeletal:     Cervical back: Normal range of motion and neck supple.  Skin:    General: Skin is warm and dry.  Neurological:     General: No focal deficit present.     Mental Status: He is alert and oriented to person, place, and time.     Cranial Nerves: No cranial nerve deficit.     Sensory: No sensory deficit.     Motor: No weakness.     Coordination: Coordination normal.     ED Results / Procedures / Treatments   Labs (all labs ordered are listed, but only abnormal results are displayed) Labs Reviewed - No data to display  EKG None  Radiology No results found.  Procedures Procedures   Medications Ordered in ED Medications  doxycycline (VIBRA-TABS) tablet 100 mg (100 mg Oral Given 09/28/20 1401)    ED Course  I have reviewed the triage vital signs and the nursing notes.  Pertinent labs & imaging results that were available during my care of the patient were reviewed by me and considered in my medical decision making (see chart for details).    MDM  Rules/Calculators/A&P                          The swelling to the right eye area is edema type clear fluid nontender no erythema no signs of cellulitis.  No visual changes.  Little bit of tenderness to the lateral aspect of the scalp skin cancers that were cauterized towards the right side of the scalp.  Is a little bit of edema to the forehead.  No erythema to the scalp.  Based on the fact there is a little bit of tenderness may be could be signs of a little bit early infection up on the scalp.  No concerns about infection around the eye.  I think that is edema fluid that has drained from the scalp area.  I will start patient on doxycycline.  Him follow back up with the [place] system.    Final Clinical Impression(s) / ED Diagnoses Final diagnoses:  Facial swelling    Rx / DC Orders ED Discharge Orders         Ordered    doxycycline (VIBRAMYCIN) 100 MG capsule  2 times daily        09/28/20 1351           Fredia Sorrow, MD 09/28/20 1408

## 2020-10-04 ENCOUNTER — Ambulatory Visit (INDEPENDENT_AMBULATORY_CARE_PROVIDER_SITE_OTHER)
Admission: RE | Admit: 2020-10-04 | Discharge: 2020-10-04 | Disposition: A | Discharge status: Home / Self Care | Payer: No Typology Code available for payment source | Source: Ambulatory Visit | Attending: Emergency Medicine | Admitting: Emergency Medicine

## 2020-10-04 ENCOUNTER — Other Ambulatory Visit: Payer: Self-pay

## 2020-10-04 DIAGNOSIS — R222 Localized swelling, mass and lump, trunk: Secondary | ICD-10-CM

## 2020-10-04 DIAGNOSIS — R918 Other nonspecific abnormal finding of lung field: Secondary | ICD-10-CM

## 2020-10-08 ENCOUNTER — Encounter: Payer: Self-pay | Admitting: Emergency Medicine

## 2020-10-08 ENCOUNTER — Ambulatory Visit (INDEPENDENT_AMBULATORY_CARE_PROVIDER_SITE_OTHER): Payer: No Typology Code available for payment source | Admitting: Emergency Medicine

## 2020-10-08 ENCOUNTER — Other Ambulatory Visit: Payer: Self-pay

## 2020-10-08 DIAGNOSIS — J90 Pleural effusion, not elsewhere classified: Secondary | ICD-10-CM | POA: Diagnosis not present

## 2020-10-08 DIAGNOSIS — R918 Other nonspecific abnormal finding of lung field: Secondary | ICD-10-CM

## 2020-10-08 DIAGNOSIS — J449 Chronic obstructive pulmonary disease, unspecified: Secondary | ICD-10-CM | POA: Diagnosis not present

## 2020-10-08 NOTE — Patient Instructions (Signed)
Please continue Spiriva Respimat 2 puffs daily Keep your albuterol available use 2 puffs if needed for shortness of breath, chest tightness, wheezing. We discussed your CT scan of the chest today, in particular we discussed that in light of your known history of skin cancer and bladder cancer.  I support your philosophy to pursue quality of life versus invasive care or chemotherapy, radiation therapy. We will not repeat your CT scan of the chest unless you have a clinical change Please let us know if your changes in any way.  If so we will repeat a chest x-ray, determine whether your pleural effusion is increasing.  If so you might benefit from drainage to help with your symptoms. Follow with Dr Lamonte Sakai in 6 months or sooner if you have any problems

## 2020-10-08 NOTE — Assessment & Plan Note (Signed)
Multiple small pulmonary nodules.  Suspect these are related to one of his malignancies.  I do not think there is indication to pursue tissue diagnosis at this time.  Further I would not repeat his CT chest if he is not interested in radiation or chemotherapy.  We will follow him clinically.

## 2020-10-08 NOTE — Progress Notes (Signed)
Subjective:    Patient ID: Joel [name], male    DOB: 1935-11-29, 85 y.o.   MRN: [id number]  HPI  ROV 08/08/20 --this follow-up visit for 85 year old gentleman with CAD/CABG, hypertension, hyperlipidemia and presumed significant COPD based on history of tobacco use.  He had an exudative loculated right pleural effusion drained 12/2019, cytology negative with 65% eosinophils, repeated 01/04/2020 with probable low-grade exudate.  All cytologies have been negative.  Also with bilateral scattered small pulmonary nodules some of which are calcified which we have plan to follow with a repeat CT in June 2022.  He has a new bladder tumor under evaluation by Dr. Warrick Parisian with urology.  This was going to be done on 1/26, but it was canceled. It looks like it is rescheduled for 08/14/20.   I added Spiriva Respimat 1/6 to see if he would get benefit.  He reports that he has benefited some - is coughing less, although he still has some PND leading to cough. Can wake him from sleep. Mucous is thick that he coughs up, difficult to clear. He is able to walk - can have some SOB when he walks / feeds dog, walks about 529ft. He has tried [facility] before, doesn't like it. Has seen some epistaxis.   MDM: Reviewed anesthesia notes from 1/26  ROV 10/08/20 --follow-up visit 85 year old man with significant COPD.  I followed him for this as well as an abnormal CT scan of the chest with history of an exudative right pleural effusion (drained in June and July 2021) with negative cytology, bilateral scattered pulmonary nodular disease some of which are calcified.  He also has a history of CAD/CABG, hypertension, hyperlipidemia and bladder tumor that was evaluated by urology. Was dx as bladder CA, and has been offered chemo and XRT but has decide to defer. He still has exertional SOB, and with bending. He is using Spiriva, feels that he is benefiting. He never uses albuterol. No real cough or CP. It sounds like he is looking for a  palliative approach.   CT chest reviewed by me from 10/05/2020, shows a small but enlarging partially loculated right pleural effusion with some apparent pleural nodularity, multiple bilateral pulmonary nodules some of which have increased in size, largest in the inferior right upper lobe 11 mm   Review of Systems As per HPI      Objective:   Physical Exam Vitals:   10/08/20 1401  BP: 118/70  Pulse: 63  Temp: (!) 97.5 F (36.4 C)  TempSrc: Temporal  SpO2: 96%  Weight: 186 lb 9.6 oz (84.6 kg)  Height: 5' 10.5" (1.791 m)    Gen: Pleasant, elderly man, in no distress,  normal affect  ENT: No lesions,  mouth clear,  oropharynx clear, no postnasal drip  Neck: No JVD, no stridor  Lungs: No use of accessory muscles, decreased, minimal wheezing  Cardiovascular: RRR, heart sounds normal, no murmur or gallops, no peripheral edema  Musculoskeletal: No deformities, no cyanosis or clubbing  Neuro: alert, awake, poor hearing, non-focal  Skin: Healing biopsy lesions on his scalp      Assessment & Plan:  COPD (chronic obstructive pulmonary disease) ([place]) May be benefiting from the Spiriva.  We will plan to continue it.  He has albuterol available, rarely uses it but I want to have it  Loculated pleural effusion Small loculated pleural effusion.  Not big enough to be impacting his breathing but it could enlarge especially if it is related to his malignancy.  He is pursuing a palliative approach but thoracentesis may be beneficial going forward if the effusion enlarges and causes dyspnea  Pulmonary nodules/lesions, multiple Multiple small pulmonary nodules.  Suspect these are related to one of his malignancies.  I do not think there is indication to pursue tissue diagnosis at this time.  Further I would not repeat his CT chest if he is not interested in radiation or chemotherapy.  We will follow him clinically.   Baltazar Apo, MD, PhD 10/08/2020, 2:34 PM [facility] [phone] or if no answer [phone]

## 2020-10-08 NOTE — Assessment & Plan Note (Signed)
May be benefiting from the [place].  We will plan to continue it.  He has albuterol available, rarely uses it but I want to have it

## 2020-10-08 NOTE — Assessment & Plan Note (Signed)
Small loculated pleural effusion.  Not big enough to be impacting his breathing but it could enlarge especially if it is related to his malignancy.  He is pursuing a palliative approach but thoracentesis may be beneficial going forward if the effusion enlarges and causes dyspnea

## 2020-12-06 ENCOUNTER — Other Ambulatory Visit: Payer: Self-pay

## 2020-12-06 ENCOUNTER — Ambulatory Visit (INDEPENDENT_AMBULATORY_CARE_PROVIDER_SITE_OTHER)
Admission: RE | Admit: 2020-12-06 | Discharge: 2020-12-06 | Disposition: A | Discharge status: Home / Self Care | Payer: No Typology Code available for payment source | Source: Ambulatory Visit | Attending: Emergency Medicine | Admitting: Emergency Medicine

## 2020-12-06 DIAGNOSIS — J984 Other disorders of lung: Secondary | ICD-10-CM | POA: Diagnosis not present

## 2020-12-06 DIAGNOSIS — R918 Other nonspecific abnormal finding of lung field: Secondary | ICD-10-CM | POA: Diagnosis not present

## 2020-12-06 DIAGNOSIS — I2510 Atherosclerotic heart disease of native coronary artery without angina pectoris: Secondary | ICD-10-CM | POA: Diagnosis not present

## 2020-12-06 DIAGNOSIS — C799 Secondary malignant neoplasm of unspecified site: Secondary | ICD-10-CM | POA: Diagnosis not present

## 2020-12-06 DIAGNOSIS — I700 Atherosclerosis of aorta: Secondary | ICD-10-CM | POA: Diagnosis not present

## 2020-12-17 ENCOUNTER — Telehealth: Payer: Self-pay | Admitting: Emergency Medicine

## 2020-12-17 NOTE — Telephone Encounter (Signed)
Please let her know that there are no new nodules present. Two of his existing nodules have slightly increased in size compared with April 2022. We can follow up to discuss further if they would like.

## 2020-12-17 NOTE — Telephone Encounter (Signed)
LMTCB  RB please advise of CT results. Thanks :)

## 2020-12-17 NOTE — Telephone Encounter (Signed)
ATC, left VM. 

## 2020-12-18 NOTE — Telephone Encounter (Signed)
Called and spoke with pt's daughter [name] letting her know the results of pt's CT results and she verbalized understanding. I have scheduled pt an appt with RB to further discuss. Nothing further needed.

## 2021-01-14 ENCOUNTER — Encounter: Payer: Self-pay | Admitting: Emergency Medicine

## 2021-01-14 ENCOUNTER — Other Ambulatory Visit: Payer: Self-pay

## 2021-01-14 ENCOUNTER — Ambulatory Visit (INDEPENDENT_AMBULATORY_CARE_PROVIDER_SITE_OTHER): Payer: Medicare Other | Admitting: Emergency Medicine

## 2021-01-14 DIAGNOSIS — J90 Pleural effusion, not elsewhere classified: Secondary | ICD-10-CM | POA: Diagnosis not present

## 2021-01-14 DIAGNOSIS — J449 Chronic obstructive pulmonary disease, unspecified: Secondary | ICD-10-CM

## 2021-01-14 DIAGNOSIS — R9389 Abnormal findings on diagnostic imaging of other specified body structures: Secondary | ICD-10-CM | POA: Diagnosis not present

## 2021-01-14 NOTE — Assessment & Plan Note (Signed)
I reviewed the CT scan of the chest with the patient and the daughter today.  He has a persistent small loculated right pleural effusion, scattered pulmonary nodules some of which are larger.  I suspect that these relate to malignancy, probably his bladder cancer.  None of the nodules seem to be pleural-based.  He is experiencing back, flank, lateral chest wall pain.  Initially question whether this may be pleuritic in nature, related to the nodules or his pleural fluid but in retrospect may be more musculoskeletal.  He has elected for a more palliative approach to his bladder cancer, no chemo, no XRT.  That being said he was having chest discomfort and we can prove that it is related to a pleural process, nodules, effusion then we may need to reconsider palliative radiation at some point.  For now I will hold off on thoracentesis (the pleural fluid is too small), plan to repeat his CT scan of the chest in 6 months to evaluate for interval change, stability.

## 2021-01-14 NOTE — Assessment & Plan Note (Signed)
Small effusion, exudative, question whether it may be related to malignancy although cytology has been negative in the past.  It has not grown.  Unclear whether it may be related to his chest discomfort but I doubt it given its stability in size and appearance.  Plan to follow with serial CT scan or sooner if he develops new symptoms.

## 2021-01-14 NOTE — Progress Notes (Signed)
Subjective:    Patient ID: Joel [name], male    DOB: 09-07-1935, 85 y.o.   MRN: [id number]  HPI  ROV 10/08/20 --follow-up visit 85 year old man with significant COPD.  I followed him for this as well as an abnormal CT scan of the chest with history of an exudative right pleural effusion (drained in June and July 2021) with negative cytology, bilateral scattered pulmonary nodular disease some of which are calcified.  He also has a history of CAD/CABG, hypertension, hyperlipidemia and bladder tumor that was evaluated by urology. Was dx as bladder CA, and has been offered chemo and XRT but has decide to defer. He still has exertional SOB, and with bending. He is using Spiriva, feels that he is benefiting. He never uses albuterol. No real cough or CP. It sounds like he is looking for a palliative approach.   CT chest reviewed by me from 10/05/2020, shows a small but enlarging partially loculated right pleural effusion with some apparent pleural nodularity, multiple bilateral pulmonary nodules some of which have increased in size, largest in the inferior right upper lobe 11 mm  ROV 01/14/21 --85 year old man with COPD, recurrent exudative right pleural effusion (drained June, July 2021, cytology negative), abnormal CT scan of the chest with bilateral scattered pulmonary nodular disease, some calcified.  Other past medical history significant for CAD/CABG, hypertension, newly diagnosed bladder cancer for which he has decided to defer chemo and XRT.  He reports some intermittent R flank / chest wall muscle spasms. He has been started on gabapentin, but could not tolerate it. Sometimes may have some pleuritic component.  He has cough, episodic aspiration sx.   CT scan of the chest performed 12/06/2020 reviewed by me, shows unchanged loculated right pleural effusion slight increase in size of an anterior right lung pulmonary nodule, slight increase in size and to left upper lobe nodules, chronic scars right  base   Review of Systems As per HPI     Objective:   Physical Exam Vitals:   01/14/21 1507  BP: 138/82  Pulse: 73  Temp: 98.2 F (36.8 C)  TempSrc: Oral  SpO2: 95%  Weight: 186 lb 12.8 oz (84.7 kg)  Height: 5' 10.5" (1.791 m)    Gen: Pleasant, elderly man, in no distress,  normal affect  ENT: No lesions,  mouth clear,  oropharynx clear, no postnasal drip  Neck: No JVD, no stridor  Lungs: No use of accessory muscles, decreased, minimal wheezing  Cardiovascular: RRR, heart sounds normal, no murmur or gallops, no peripheral edema  Musculoskeletal: point tenderness R flank and back, costal margin, reproduces his pain.   Neuro: alert, awake, poor hearing, non-focal  Skin: scalp lesion has healed      Assessment & Plan:  Abnormal CT of the chest I reviewed the CT scan of the chest with the patient and the daughter today.  He has a persistent small loculated right pleural effusion, scattered pulmonary nodules some of which are larger.  I suspect that these relate to malignancy, probably his bladder cancer.  None of the nodules seem to be pleural-based.  He is experiencing back, flank, lateral chest wall pain.  Initially question whether this may be pleuritic in nature, related to the nodules or his pleural fluid but in retrospect may be more musculoskeletal.  He has elected for a more palliative approach to his bladder cancer, no chemo, no XRT.  That being said he was having chest discomfort and we can prove that it is related  to a pleural process, nodules, effusion then we may need to reconsider palliative radiation at some point.  For now I will hold off on thoracentesis (the pleural fluid is too small), plan to repeat his CT scan of the chest in 6 months to evaluate for interval change, stability.  Loculated pleural effusion Small effusion, exudative, question whether it may be related to malignancy although cytology has been negative in the past.  It has not grown.  Unclear  whether it may be related to his chest discomfort but I doubt it given its stability in size and appearance.  Plan to follow with serial CT scan or sooner if he develops new symptoms.  COPD (chronic obstructive pulmonary disease) ([place]) He is benefiting from Spiriva.  Plan to continue this medication daily.  Time spent 45 minutes  Baltazar Apo, MD, PhD 01/14/2021, 3:57 PM [facility] [phone] or if no answer [phone]

## 2021-01-14 NOTE — Assessment & Plan Note (Signed)
He is benefiting from Spiriva.  Plan to continue this medication daily.

## 2021-01-14 NOTE — Patient Instructions (Addendum)
We will plan to repeat your CT scan of the chest in December 2022. Continue your Spiriva as you have been taking it. Follow Dr. Lamonte Sakai in 6 months to review your CT scan or sooner if you develop any changes in your breathing, chest discomfort.

## 2021-05-23 ENCOUNTER — Emergency Department (HOSPITAL_COMMUNITY)
Admission: EM | Admit: 2021-05-23 | Discharge: 2021-05-24 | Disposition: A | Discharge status: Home / Self Care | Payer: No Typology Code available for payment source | Attending: Emergency Medicine | Admitting: Emergency Medicine

## 2021-05-23 ENCOUNTER — Emergency Department (HOSPITAL_COMMUNITY): Payer: No Typology Code available for payment source

## 2021-05-23 ENCOUNTER — Other Ambulatory Visit: Payer: Self-pay

## 2021-05-23 ENCOUNTER — Encounter (HOSPITAL_COMMUNITY): Payer: Self-pay | Admitting: Emergency Medicine

## 2021-05-23 DIAGNOSIS — Z85828 Personal history of other malignant neoplasm of skin: Secondary | ICD-10-CM | POA: Diagnosis not present

## 2021-05-23 DIAGNOSIS — R109 Unspecified abdominal pain: Secondary | ICD-10-CM | POA: Diagnosis not present

## 2021-05-23 DIAGNOSIS — I119 Hypertensive heart disease without heart failure: Secondary | ICD-10-CM | POA: Insufficient documentation

## 2021-05-23 DIAGNOSIS — N3001 Acute cystitis with hematuria: Secondary | ICD-10-CM | POA: Diagnosis not present

## 2021-05-23 DIAGNOSIS — I2510 Atherosclerotic heart disease of native coronary artery without angina pectoris: Secondary | ICD-10-CM | POA: Diagnosis not present

## 2021-05-23 DIAGNOSIS — R3989 Other symptoms and signs involving the genitourinary system: Secondary | ICD-10-CM | POA: Diagnosis not present

## 2021-05-23 DIAGNOSIS — R110 Nausea: Secondary | ICD-10-CM | POA: Diagnosis not present

## 2021-05-23 DIAGNOSIS — N50819 Testicular pain, unspecified: Secondary | ICD-10-CM | POA: Insufficient documentation

## 2021-05-23 DIAGNOSIS — Z7951 Long term (current) use of inhaled steroids: Secondary | ICD-10-CM | POA: Insufficient documentation

## 2021-05-23 DIAGNOSIS — Z79899 Other long term (current) drug therapy: Secondary | ICD-10-CM | POA: Insufficient documentation

## 2021-05-23 DIAGNOSIS — R319 Hematuria, unspecified: Secondary | ICD-10-CM | POA: Diagnosis present

## 2021-05-23 DIAGNOSIS — R310 Gross hematuria: Secondary | ICD-10-CM

## 2021-05-23 DIAGNOSIS — Z87891 Personal history of nicotine dependence: Secondary | ICD-10-CM | POA: Diagnosis not present

## 2021-05-23 DIAGNOSIS — J449 Chronic obstructive pulmonary disease, unspecified: Secondary | ICD-10-CM | POA: Insufficient documentation

## 2021-05-23 LAB — CBC WITH DIFFERENTIAL/PLATELET
Abs Immature Granulocytes: 0.02 10*3/uL (ref 0.00–0.07)
Basophils Absolute: 0.1 10*3/uL (ref 0.0–0.1)
Basophils Relative: 1 %
Eosinophils Absolute: 0.4 10*3/uL (ref 0.0–0.5)
Eosinophils Relative: 4 %
HCT: 48.6 % (ref 39.0–52.0)
Hemoglobin: 16.2 g/dL (ref 13.0–17.0)
Immature Granulocytes: 0 %
Lymphocytes Relative: 25 %
Lymphs Abs: 2.5 10*3/uL (ref 0.7–4.0)
MCH: 31.5 pg (ref 26.0–34.0)
MCHC: 33.3 g/dL (ref 30.0–36.0)
MCV: 94.4 fL (ref 80.0–100.0)
Monocytes Absolute: 1.1 10*3/uL — ABNORMAL HIGH (ref 0.1–1.0)
Monocytes Relative: 11 %
Neutro Abs: 5.9 10*3/uL (ref 1.7–7.7)
Neutrophils Relative %: 59 %
Platelets: 243 10*3/uL (ref 150–400)
RBC: 5.15 MIL/uL (ref 4.22–5.81)
RDW: 11.9 % (ref 11.5–15.5)
WBC: 9.9 10*3/uL (ref 4.0–10.5)
nRBC: 0 % (ref 0.0–0.2)

## 2021-05-23 LAB — COMPREHENSIVE METABOLIC PANEL
ALT: 16 U/L (ref 0–44)
AST: 16 U/L (ref 15–41)
Albumin: 3.6 g/dL (ref 3.5–5.0)
Alkaline Phosphatase: 90 U/L (ref 38–126)
Anion gap: 7 (ref 5–15)
BUN: 12 mg/dL (ref 8–23)
CO2: 28 mmol/L (ref 22–32)
Calcium: 9.1 mg/dL (ref 8.9–10.3)
Chloride: 102 mmol/L (ref 98–111)
Creatinine, Ser: 1.03 mg/dL (ref 0.61–1.24)
GFR, Estimated: >60 mL/min (ref >60)
Glucose, Bld: 114 mg/dL — ABNORMAL HIGH (ref 70–99)
Potassium: 3.9 mmol/L (ref 3.5–5.1)
Sodium: 137 mmol/L (ref 135–145)
Total Bilirubin: 1.2 mg/dL (ref 0.3–1.2)
Total Protein: 7 g/dL (ref 6.5–8.1)

## 2021-05-23 LAB — URINALYSIS, ROUTINE W REFLEX MICROSCOPIC
Bilirubin Urine: NEGATIVE
Glucose, UA: NEGATIVE mg/dL
Ketones, ur: NEGATIVE mg/dL
Leukocytes,Ua: NEGATIVE
Nitrite: NEGATIVE
Protein, ur: 30 mg/dL — AB
Specific Gravity, Urine: 1.018 (ref 1.005–1.030)
pH: 5 (ref 5.0–8.0)

## 2021-05-23 NOTE — ED Provider Notes (Signed)
Emergency Medicine Provider Triage Evaluation Note  Joel [name] , a 85 y.o. male  was evaluated in triage.  Pt complains of dysuria, hematuria, difficulty urinating, and back pain.  Patient has stage IV bladder cancer and is not receiving any chemotherapy or radiation.  Patient has been dealing with the symptoms intermittently over the last 11 months however have become worse since this weekend.  Was told to come to the emergency department for evaluation by his physicians at the [place].  Review of Systems  Positive: Hematuria, dysuria, difficulty urinating, testicular pain, chills, back pain Negative: Fever, abdominal pain, nausea, vomiting  Physical Exam  BP (!) 159/95 (BP Location: Left Arm)   Pulse 84   Temp 98.6 F (37 C) (Oral)   Resp 20   SpO2 97%  Gen:   Awake, no distress   Resp:  Normal effort  MSK:   Moves extremities without difficulty  Other:  Abdomen soft, nondistended, nontender.  No guarding or rebound tenderness.  Right CVA tenderness.  Medical Decision Making  Medically screening exam initiated at 2:10 PM.  Appropriate orders placed.  Nyoka Lint was informed that the remainder of the evaluation will be completed by another provider, this initial triage assessment does not replace that evaluation, and the importance of remaining in the ED until their evaluation is complete.     Loni Beckwith, PA-C 05/23/21 1412    Hayden Rasmussen, MD 05/23/21 [phone]

## 2021-05-23 NOTE — ED Triage Notes (Signed)
Patient coming from home. Complaint of dysuria, hematuria, back pain starting this weekend. VSS. Cancer pt not receiving treatment.

## 2021-05-24 MED ORDER — CEFDINIR 300 MG PO CAPS: 300.0000 mg | ORAL_CAPSULE | Freq: Two times a day (BID) | ORAL | 0 refills | Status: DC

## 2021-05-24 MED ORDER — PHENAZOPYRIDINE HCL 200 MG PO TABS: 200.0000 mg | ORAL_TABLET | Freq: Three times a day (TID) | ORAL | 0 refills | Status: AC

## 2021-05-24 MED ORDER — SODIUM CHLORIDE 0.9 % IV SOLN
2.0000 g | Freq: Once | INTRAVENOUS | Status: AC
  Administered 2021-05-24: 2 g via INTRAVENOUS
  Filled 2021-05-24: qty 20

## 2021-05-24 NOTE — Discharge Instructions (Addendum)
Schedule follow-up with urology at the [facility] for as soon as possible.  If you develop fever, vomiting, increased pain, return to the ER.

## 2021-05-24 NOTE — ED Provider Notes (Signed)
[facility] EMERGENCY DEPARTMENT Provider Note   CSN: [id number] Arrival date & time: 05/23/21  1242     History Chief Complaint  Patient presents with   Abdominal Pain   Nausea    Joel [name] is a 85 y.o. male.  Patient presents to the emergency department for evaluation of dysuria, bladder area pain, testicular pain, hematuria.  Patient does have a history of bladder cancer.  He has not currently being treated for bladder cancer.  Patient reports that he has been passing blood and clots recently and has noticed some pain with urination.  Additionally he reports that both of his testicles hurt and he occasionally has pain in the area of his rectum.  He has not had any fever.      Past Medical History:  Diagnosis Date   Arthritis    Bifascicular block    Coronary artery disease    Post CABG in 2001 with patent grafts in 2010  (90% mid LAD stenosis with competing flow from LIMA distally, 90% OM stenosis with competing flow from SVG,  90% RCA stenosis with competing flow from SVG, occluded SVG to Posterolateral branch).   Gastritis    GERD (gastroesophageal reflux disease)    History of bladder cancer    stage 4   History of nephrolithiasis    Hyperlipidemia    Hypertension    Partial tear of rotator cuff(726.13)    Right   Psoriasis     Patient Active Problem List   Diagnosis Date Noted   COPD (chronic obstructive pulmonary disease) ([place]) 07/11/2020   Abnormal CT of the chest 07/11/2020   Status post thoracentesis    Gallstones 12/22/2019   Cancer of skin of right ear 12/22/2019   Dysphagia 12/22/2019   Pleural effusion 12/14/2019   Loculated pleural effusion 12/13/2019   Dizziness 08/30/2018   Routine general medical examination at a health care facility 11/09/2016   Hernia, inguinal 06/06/2015   History of fall 11/09/2014   Encounter for Medicare annual wellness exam 11/06/2013   Prostate cancer screening 05/09/2013   Prediabetes  07/31/2011   HTN (hypertension) 06/16/2011   Night muscle spasms 01/21/2011   Low back pain 01/16/2010   CAD, AUTOLOGOUS BYPASS GRAFT 11/07/2008   FLATULENCE-GAS-BLOATING 08/02/2008   URINARY CALCULUS 10/17/2007   Coronary atherosclerosis 10/03/2007   Hyperlipidemia 04/13/2007   Hearing loss 04/13/2007   RHINITIS, CHRONIC 04/13/2007   GERD 04/13/2007   Actinic keratosis 04/13/2007   NEPHROLITHIASIS, HX OF 04/13/2007    Past Surgical History:  Procedure Laterality Date   BACK SURGERY     CARDIAC CATHETERIZATION  01/2000   Multi vessel PTCA   CARDIAC CATHETERIZATION  11/2008   multi vesstel dz, re occlusion small vessel   CORONARY ARTERY BYPASS GRAFT  2001   CT ABD W & PELVIS WO CM  09/2001   Kidney stones   CT ABD WO/W & PELVIS WO CM  10/14/2007   Low grade obstruction on R by 23mm prox ereteral calculus, Sm bilat renal calculi sm HH   CYSTOSTOMY W/ BLADDER BIOPSY     Diffuision of unstable angina  03/2000   ESOPHAGOGASTRODUODENOSCOPY  08/2008   Mild gastritis and GERD   INGUINAL HERNIA REPAIR Right 09/10/2015   Procedure: RIGHT INGUINAL HERNIA REPAIR WITH MESH;  Surgeon: Coralie Keens, MD;  Location: [place];  Service: General;  Laterality: Right;   INSERTION OF MESH Right 09/10/2015   Procedure: INSERTION OF MESH;  Surgeon: Coralie Keens, MD;  Location: [facility] OR;  Service: General;  Laterality: Right;   KIDNEY STONE SURGERY  10/2007   Removal R ureteral kidney stone   NASAL SINUS SURGERY  04/2002   NECK SURGERY     Pelvic CT  10/14/2007   nml   PTCA  01/2000   Subendocardial MI   SKIN CANCER EXCISION     Stress cardiolite  10/2003   No acute changes   THORACENTESIS N/A 01/04/2020   Procedure: THORACENTESIS;  Surgeon: Candee Furbish, MD;  Location: [facility] ENDOSCOPY;  Service: Pulmonary;  Laterality: N/A;   TONSILLECTOMY         Family History  Problem Relation Age of Onset   Transient ischemic attack Mother    Coronary artery disease Mother    Heart disease Mother         CAD   Alcohol abuse Father    Diabetes type II Father    Diabetes Father    Heart disease Father        pacemaker    Social History   Tobacco Use   Smoking status: Former    Packs/day: 1.50    Years: 50.00    Pack years: 75.00    Types: Cigarettes    Quit date: 12/05/1999    Years since quitting: 21.4   Smokeless tobacco: Never  Vaping Use   Vaping Use: Never used  Substance Use Topics   Alcohol use: No    Alcohol/week: 0.0 standard drinks   Drug use: No    Home Medications Prior to Admission medications   Medication Sig Start Date End Date Taking? Authorizing Provider  albuterol (VENTOLIN HFA) 108 (90 Base) MCG/ACT inhaler Inhale 2 puffs into the lungs every 6 (six) hours as needed for wheezing or shortness of breath. 08/08/20   Collene Gobble, MD  amLODipine (NORVASC) 5 MG tablet Take 1 tablet (5 mg total) by mouth daily. 12/15/19 03/15/21  Mercy Riding, MD  b complex vitamins capsule Take 1 capsule by mouth daily. 04/09/20   [provider]  carvedilol (COREG) 6.25 MG tablet Take by mouth. 06/06/20   [provider]  doxycycline (VIBRAMYCIN) 100 MG capsule Take 1 capsule (100 mg total) by mouth 2 (two) times daily. 09/28/20   Fredia Sorrow, MD  guaiFENesin (MUCINEX) 600 MG 12 hr tablet Take 1 tablet (600 mg total) by mouth 2 (two) times daily. 08/08/20   Collene Gobble, MD  hydrochlorothiazide (HYDRODIURIL) 25 MG tablet Take 1 tablet (25 mg total) by mouth daily. 12/15/19   Mercy Riding, MD  hydrophilic ointment Apply topically as needed for dry skin. For use with Fluorouracil    [provider]  Lactobacillus (PROBIOTIC ACIDOPHILUS) CAPS Take 1 capsule by mouth in the morning and at bedtime. 12/15/19   Mercy Riding, MD  latanoprost (XALATAN) 0.005 % ophthalmic solution Place 1 drop into both eyes at bedtime.    [provider]  loratadine (CLARITIN) 10 MG tablet Take 1 tablet (10 mg total) by mouth daily. 08/08/20   Collene Gobble, MD   Magnesium Oxide 420 MG TABS Take by mouth. 04/09/20   [provider]  Multiple Vitamin (MULTIVITAMIN) tablet Take 1 tablet by mouth daily.    [provider]  Omega-3 Fatty Acids (FISH OIL) 1000 MG CAPS Take 1 capsule by mouth daily.    [provider]  Tiotropium Bromide Monohydrate (SPIRIVA RESPIMAT) 2.5 MCG/ACT AERS Inhale 2 puffs into the lungs daily. 08/12/20   Baltazar Apo  S, MD    Allergies    Drug class [statins]  Review of Systems   Review of Systems  Genitourinary:  Positive for difficulty urinating, dysuria and hematuria.  All other systems reviewed and are negative.  Physical Exam Updated Vital Signs BP (!) 160/89   Pulse 80   Temp 98.9 F (37.2 C)   Resp 17   Ht 5\' 10"  (1.778 m)   Wt 83.5 kg   SpO2 98%   BMI 26.40 kg/m   Physical Exam Vitals and nursing note reviewed. Exam conducted with a chaperone present.  Constitutional:      General: He is not in acute distress.    Appearance: Normal appearance. He is well-developed.  HENT:     Head: Normocephalic and atraumatic.     Right Ear: Hearing normal.     Left Ear: Hearing normal.     Nose: Nose normal.  Eyes:     Conjunctiva/sclera: Conjunctivae normal.     Pupils: Pupils are equal, round, and reactive to light.  Cardiovascular:     Rate and Rhythm: Regular rhythm.     Heart sounds: S1 normal and S2 normal. No murmur heard.   No friction rub. No gallop.  Pulmonary:     Effort: Pulmonary effort is normal. No respiratory distress.     Breath sounds: Normal breath sounds.  Chest:     Chest wall: No tenderness.  Abdominal:     General: Bowel sounds are normal.     Palpations: Abdomen is soft.     Tenderness: There is no abdominal tenderness. There is no guarding or rebound. Negative signs include Murphy's sign and McBurney's sign.     Hernia: No hernia is present.  Genitourinary:    Prostate: Enlarged and tender. No nodules present.  Musculoskeletal:        General: Normal  range of motion.     Cervical back: Normal range of motion and neck supple.  Skin:    General: Skin is warm and dry.     Findings: No rash.  Neurological:     Mental Status: He is alert and oriented to person, place, and time.     GCS: GCS eye subscore is 4. GCS verbal subscore is 5. GCS motor subscore is 6.     Cranial Nerves: No cranial nerve deficit.     Sensory: No sensory deficit.     Coordination: Coordination normal.  Psychiatric:        Speech: Speech normal.        Behavior: Behavior normal.        Thought Content: Thought content normal.    ED Results / Procedures / Treatments   Labs (all labs ordered are listed, but only abnormal results are displayed) Labs Reviewed  URINALYSIS, ROUTINE W REFLEX MICROSCOPIC - Abnormal; Notable for the following components:      Result Value   APPearance HAZY (*)    Hgb urine dipstick MODERATE (*)    Protein, ur 30 (*)    Bacteria, UA RARE (*)    All other components within normal limits  COMPREHENSIVE METABOLIC PANEL - Abnormal; Notable for the following components:   Glucose, Bld 114 (*)    All other components within normal limits  CBC WITH DIFFERENTIAL/PLATELET - Abnormal; Notable for the following components:   Monocytes Absolute 1.1 (*)    All other components within normal limits  URINE CULTURE    EKG None  Radiology CT Renal Stone Study  Result Date: 05/23/2021  CLINICAL DATA:  Flank pain, kidney stone suspected. Gross Hematuria, clots coming out when urinating. Eval for kidney stones EXAM: CT ABDOMEN AND PELVIS WITHOUT CONTRAST TECHNIQUE: Multidetector CT imaging of the abdomen and pelvis was performed following the standard protocol without IV contrast. COMPARISON:  CT chest 12/06/2020, CT abdomen pelvis 10/14/07 FINDINGS: Lower chest: Partially visualized scarring of the lung bases. Trace left pleural effusions partially visualized. Likely trace right pleural effusion partially visualized. Hepatobiliary: No focal liver  abnormality. Multiple subcentimeter calcified gallstones within the gallbladder lumen. No gallbladder wall thickening or pericholecystic fluid. No biliary dilatation. Pancreas: No focal lesion. Normal pancreatic contour. No surrounding inflammatory changes. No main pancreatic ductal dilatation. Spleen: Normal in size without focal abnormality. Adrenals/Urinary Tract: No adrenal nodule bilaterally. 4 mm calcified stone within the left kidney. No right nephrolithiasis. No hydronephrosis. No hydronephrosis. No definite contour-deforming renal mass. No ureterolithiasis or hydroureter. Layering hyperdensity within the urinary bladder lumen. Question irregular urinary bladder wall thickening. Stomach/Bowel: Stomach is within normal limits. No evidence of bowel wall thickening or dilatation. Appendix appears normal. Vascular/Lymphatic: No abdominal aorta or iliac aneurysm. Similar-appearing severe atherosclerotic plaque of the aorta and its branches. No abdominal, pelvic, or inguinal lymphadenopathy. Reproductive: Prostate is unremarkable. Other: No intraperitoneal free fluid. No intraperitoneal free gas. No organized fluid collection. Musculoskeletal: Tiny fat containing left inguinal hernia. Diffusely decreased bone density. No suspicious lytic or blastic osseous lesions. No acute displaced fracture. Multilevel degenerative changes of the spine. Vertebral body height loss of the L2 through L4 vertebral bodies. IMPRESSION: 1. Bilateral partially visualized trace pleural effusions. 2. Question layering blood products within the urinary bladder lumen versus irregular urinary bladder wall thickening. Markedly limited evaluation on this noncontrast study. 3. Cholelithiasis with no CT findings of acute cholecystitis. 4. Nonobstructive 4 mm left nephrolithiasis. 5.  Aortic Atherosclerosis (ICD10-I70.0) - severe. Electronically Signed   By: Iven Finn M.D.   On: 05/23/2021 16:52    Procedures Procedures   Medications  Ordered in ED Medications  cefTRIAXone (ROCEPHIN) 2 g in sodium chloride 0.9 % 100 mL IVPB (2 g Intravenous New Bag/Given 05/24/21 0135)    ED Course  I have reviewed the triage vital signs and the nursing notes.  Pertinent labs & imaging results that were available during my care of the patient were reviewed by me and considered in my medical decision making (see chart for details).    MDM Rules/Calculators/A&P                           Patient presents with dysuria, hematuria.  His work-up is reassuring.  Blood work is unremarkable.  CT scan shows possible layering blood products within the urinary bladder lumen.  This may also reflect his known bladder cancer.  No acute findings.  Urinalysis equivocal but symptoms do suggest possible infection.  Additionally he may simply be having dysuria secondary to bladder spasms from obstruction intermittently from blood clots.  Will treat empirically.  He is complaining of testicular pain and rectal pain.  Testicle exam is unremarkable.  We will cover for possible prostatitis.  He does not appear ill, afebrile.  Appropriate for outpatient management and follow-up with urology.  Given return precautions.  Final Clinical Impression(s) / ED Diagnoses Final diagnoses:  Gross hematuria  Acute cystitis with hematuria    Rx / DC Orders ED Discharge Orders     None        Chrystle Murillo, Gwenyth Allegra, MD 05/24/21 [phone]

## 2021-05-25 LAB — URINE CULTURE: Culture: <10000 — AB

## 2021-06-18 ENCOUNTER — Ambulatory Visit (INDEPENDENT_AMBULATORY_CARE_PROVIDER_SITE_OTHER)
Admission: RE | Admit: 2021-06-18 | Discharge: 2021-06-18 | Disposition: A | Discharge status: Home / Self Care | Payer: Medicare Other | Source: Ambulatory Visit | Attending: Emergency Medicine | Admitting: Emergency Medicine

## 2021-06-18 ENCOUNTER — Other Ambulatory Visit: Payer: Self-pay

## 2021-06-18 DIAGNOSIS — R9389 Abnormal findings on diagnostic imaging of other specified body structures: Secondary | ICD-10-CM

## 2021-06-24 ENCOUNTER — Telehealth: Payer: Self-pay

## 2021-06-24 NOTE — Telephone Encounter (Signed)
Spoke with patient's daughter [name] and scheduled an in-person Palliative Consult for 07/17/21 @ 12:30PM.  COVID screening was negative. No pets in home. Patient lives alone.  Consent obtained; updated Outlook/Netsmart/Team List and Epic.   Family is aware they may be receiving a call from provider the day before or day of to confirm appointment.

## 2021-06-25 ENCOUNTER — Telehealth: Payer: Self-pay | Admitting: Emergency Medicine

## 2021-06-25 NOTE — Telephone Encounter (Signed)
Lm for Kathy(DPR).

## 2021-07-02 ENCOUNTER — Ambulatory Visit (INDEPENDENT_AMBULATORY_CARE_PROVIDER_SITE_OTHER): Payer: Medicare Other | Admitting: Emergency Medicine

## 2021-07-02 ENCOUNTER — Other Ambulatory Visit: Payer: Self-pay

## 2021-07-02 ENCOUNTER — Encounter: Payer: Self-pay | Admitting: Emergency Medicine

## 2021-07-02 DIAGNOSIS — R9389 Abnormal findings on diagnostic imaging of other specified body structures: Secondary | ICD-10-CM | POA: Diagnosis not present

## 2021-07-02 DIAGNOSIS — J449 Chronic obstructive pulmonary disease, unspecified: Secondary | ICD-10-CM

## 2021-07-02 NOTE — Assessment & Plan Note (Addendum)
Pleural disease that is enlarging, right upper lobe pulmonary nodule that is enlarging.  All consistent with either primary lung cancer or metastatic bladder cancer.  He has chosen to pursue palliative care and I support this decision.  He needs better pain control as he is having significant pleuritic pain.  I will defer any repeat CT scans of the chest given the direction of his care.  He will follow-up with the [place].

## 2021-07-02 NOTE — Assessment & Plan Note (Signed)
He came off his long-acting bronchodilator, did not feel that he needed anymore.  Still has his albuterol but uses this rarely.

## 2021-07-02 NOTE — Patient Instructions (Signed)
We reviewed your CT scan of the chest today.  The pulmonary nodule in your right upper lobe as well as the right sided pleural involvement have progressed compared with your priors.  This is consistent with cancer either primary lung cancer or metastatic bladder cancer.  I support your plans to pursue palliative care.  Please discuss with them the pleuritic chest pain that you have been experiencing so you can be treated with adequate pain medication. Keep your albuterol available to use 2 puffs when needed for shortness of breath, chest tightness, wheezing. Follow with Dr Lamonte Sakai if needed

## 2021-07-02 NOTE — Progress Notes (Signed)
° °  Subjective:    Patient ID: Joel [name], male    DOB: Oct 27, 1935, 85 y.o.   MRN: [id number]  HPI  ROV 01/14/21 --85 year old man with COPD, recurrent exudative right pleural effusion (drained June, July 2021, cytology negative), abnormal CT scan of the chest with bilateral scattered pulmonary nodular disease, some calcified.  Other past medical history significant for CAD/CABG, hypertension, newly diagnosed bladder cancer for which he has decided to defer chemo and XRT.  He reports some intermittent R flank / chest wall muscle spasms. He has been started on gabapentin, but could not tolerate it. Sometimes may have some pleuritic component.  He has cough, episodic aspiration sx.   CT scan of the chest performed 12/06/2020 reviewed by me, shows unchanged loculated right pleural effusion slight increase in size of an anterior right lung pulmonary nodule, slight increase in size and to left upper lobe nodules, chronic scars right base  ROV 07/02/21 --follow-up visit for 85 year old man with a history of COPD, recurrent right exudative pleural effusion that was cytology negative (underwent several thoracenteses), scattered calcified pulmonary nodular disease, some enlarging nodules, suspected to be related to his bladder cancer. PMH CAD/CABG, hypertension, bladder cancer (deferred therapy) He has been having blood clots in his urine.  He is having a lot of R sided flank pain, has pleuritic quality.   CT chest performed on 06/18/2021 reviewed by me, shows enlarging pulmonary nodules, 2.4 x 1.9 cm in the right upper lobe, pleural nodularity and loculated pleural fluid on the right consistent with probable metastatic disease   Review of Systems As per HPI     Objective:   Physical Exam Vitals:   07/02/21 1542  BP: 132/80  Pulse: 72  Temp: 98.1 F (36.7 C)  TempSrc: Oral  SpO2: 99%  Weight: 175 lb (79.4 kg)  Height: 5\' 10"  (1.778 m)    Gen: Pleasant, elderly man, in no distress,  normal  affect  ENT: No lesions,  mouth clear,  oropharynx clear, no postnasal drip  Neck: No JVD, no stridor  Lungs: No use of accessory muscles, decreased, minimal wheezing  Cardiovascular: RRR, heart sounds normal, no murmur or gallops, no peripheral edema  Musculoskeletal: point tenderness R flank and back, costal margin, reproduces his pain.   Neuro: alert, awake, poor hearing, non-focal  Skin: scalp lesion has healed      Assessment & Plan:  COPD (chronic obstructive pulmonary disease) ([place]) He came off his long-acting bronchodilator, did not feel that he needed anymore.  Still has his albuterol but uses this rarely.  Abnormal CT of the chest Pleural disease that is enlarging, right upper lobe pulmonary nodule that is enlarging.  All consistent with either primary lung cancer or metastatic bladder cancer.  He has chosen to pursue palliative care and I support this decision.  He needs better pain control as he is having significant pleuritic pain.  I will defer any repeat CT scans of the chest given the direction of his care.  He will follow-up with the [place].    Baltazar Apo, MD, PhD 07/02/2021, 4:02 PM [facility] [phone] or if no answer [phone]

## 2021-07-17 ENCOUNTER — Other Ambulatory Visit: Payer: Medicare Other | Admitting: Hospice

## 2021-07-17 ENCOUNTER — Other Ambulatory Visit: Payer: Self-pay

## 2021-07-17 DIAGNOSIS — C679 Malignant neoplasm of bladder, unspecified: Secondary | ICD-10-CM

## 2021-07-17 DIAGNOSIS — Z515 Encounter for palliative care: Secondary | ICD-10-CM | POA: Diagnosis not present

## 2021-07-17 DIAGNOSIS — C7800 Secondary malignant neoplasm of unspecified lung: Secondary | ICD-10-CM | POA: Diagnosis not present

## 2021-07-17 DIAGNOSIS — R531 Weakness: Secondary | ICD-10-CM

## 2021-07-17 DIAGNOSIS — M545 Low back pain, unspecified: Secondary | ICD-10-CM

## 2021-07-17 DIAGNOSIS — R3914 Feeling of incomplete bladder emptying: Secondary | ICD-10-CM | POA: Diagnosis not present

## 2021-07-17 DIAGNOSIS — N401 Enlarged prostate with lower urinary tract symptoms: Secondary | ICD-10-CM

## 2021-07-17 NOTE — Progress Notes (Signed)
[facility] Consult Note Telephone: [phone]  Fax: [phone]  PATIENT NAME: Joel [name] [address] Joel [name] [address] [id number] (home)  DOB: [birth year] MRN: [id number]  PRIMARY CARE PROVIDER:    Abner Greenspan, MD,  [place] [address] [phone]  REFERRING PROVIDER:   Monico Blitz NP  RESPONSIBLE PARTY:   [place]     Name Relation Home Work [place] Daughter  [phone]   Gweneth Dimitri Daughter   [phone]        I met face to face with patient and family at home. Palliative Care was asked to follow this patient by consultation request of  Monico Blitz NP to address advance care planning, complex medical decision making and goals of care clarification. [name] is present with patient during visit. This is the initial visit.   ASSESSMENT AND / RECOMMENDATIONS:   Advance Care Planning: Our advance care planning conversation included a discussion about:    The value and importance of advance care planning  Difference between Hospice and Palliative care Exploration of goals of care in the event of a sudden injury or illness  Identification and preparation of a healthcare agent  Review and updating or creation of an  advance directive document . Decision not to resuscitate or to de-escalate disease focused treatments due to poor prognosis.  CODE STATUS: Ramifications and implications of CODE STATUS discussed.  Patient affirmed he is a DO NOT RESUSCITATE.  NP signed DNR form for patient to keep at home.  Goals of Care: Goals include to maximize quality of life and symptom management Family is interested in hospice service when he qualifies for it.   I spent 46 minutes providing this initial consultation. More than 50% of the time in this consultation was spent on counseling patient and coordinating  communication. --------------------------------------------------------------------------------------------------------------------------------------  Symptom Management/Plan: Weakness: Related to metastatic bladder CA. Ambulatory. Fall precautions. Continue physical activity as tolerated. Balance of rest and performance activity.  Bladder CA: metastatic to Lungs. No plans for chemo/radiation. Continues with Oncology surveillance every 3 months.  Routine CBC BMP BPH/overactive bladder: Patient continues on Tamsolosin and Oxybutynin.Follow up appointment with Urologist 08/11/21.  Low back pain:Managed with OTC Tylenol- effective Follow up: Palliative care will continue to follow for complex medical decision making, advance care planning, and clarification of goals. Return 6 weeks or prn. Encouraged to call provider sooner with any concerns.   Family /Caregiver/Community Supports: Patient lives at home alone. Kathy visits often and is involved with his care.   HOSPICE ELIGIBILITY/DIAGNOSIS: Patient was recently evaluated for hospice and did not qualify.  Re-eval for hospice in 2 weeks  Chief Complaint: Initial Palliative care visit  HISTORY OF PRESENT ILLNESS:  Joel [name] is a 86 y.o. year old male  with multiple medical conditions including weakness, worsening related to Bladder CA stage 4 with metastasis to lungs - nodules/lesions seen in last CAT scan.  Patient endorses ongoing weakness worse as the day progresses and sometimes seeing some blood clots when he urinates. Weakness and debility impairs his quality of life; he believes getting on hospice and having aides to help at home will be helpful. He endorses occasional low back pain  managed with Tylenol.  History , HTN, pleural effusions, Skin Cancer. History obtained from review of EMR, discussion with primary team, caregiver, family and/or Mr. Dewolfe.  Review and summarization of Epic records shows history from  other than patient. Rest of  10 point ROS asked and negative.  I reviewed as needed, available labs, patient records, imaging, studies and related documents from the EMR.  ROS General: NAD EYES: denies vision changes ENMT: endorses occasional dysphagia Cardiovascular: denies chest pain/discomfort Pulmonary: denies SOB Abdomen: endorses fair appetite, denies constipation/diarrhea GU: denies dysuria, endorses urinary frequency MSK:  endorses weakness Skin: denies rashes or wounds Neurological: denies pain, denies insomnia Psych: Endorses positive mood Heme/lymph/immuno: denies bruises, abnormal bleeding  Physical Exam: Height/Weight: 5 feet 10 inches Constitutional: NAD General: Well groomed, cooperative EYES: anicteric sclera, lids intact, no discharge  ENMT: Moist mucous membrane CV: S1 S2, RRR, no LE edema Pulmonary: LCTA, no increased work of breathing, no cough, Abdomen: active BS + 4 quadrants, soft and non tender GU: no suprapubic tenderness MSK: weakness, ambulates, holds on walls and furniture Skin: warm and dry, no rashes or wounds on visible skin Neuro:  weakness, otherwise non focal Psych: coping Hem/lymph/immuno: no widespread bruising   PAST MEDICAL HISTORY:  Active Ambulatory Problems    Diagnosis Date Noted   Hyperlipidemia 04/13/2007   Hearing loss 04/13/2007   Coronary atherosclerosis 10/03/2007   CAD, AUTOLOGOUS BYPASS GRAFT 11/07/2008   RHINITIS, CHRONIC 04/13/2007   GERD 04/13/2007   URINARY CALCULUS 10/17/2007   Actinic keratosis 04/13/2007   Low back pain 01/16/2010   FLATULENCE-GAS-BLOATING 08/02/2008   NEPHROLITHIASIS, HX OF 04/13/2007   Night muscle spasms 01/21/2011   HTN (hypertension) 06/16/2011   Prediabetes 07/31/2011   Prostate cancer screening 05/09/2013   Encounter for Medicare annual wellness exam 11/06/2013   History of fall 11/09/2014   Hernia, inguinal 06/06/2015   Routine general medical examination at a health care facility 11/09/2016   Dizziness  08/30/2018   Loculated pleural effusion 12/13/2019   Pleural effusion 12/14/2019   Gallstones 12/22/2019   Cancer of skin of right ear 12/22/2019   Dysphagia 12/22/2019   Status post thoracentesis    COPD (chronic obstructive pulmonary disease) ([place]) 07/11/2020   Abnormal CT of the chest 07/11/2020   Resolved Ambulatory Problems    Diagnosis Date Noted   RUQ PAIN 07/10/2008   HEMATOMA 07/05/2008   NECK PAIN 07/22/2010   Laceration of arm 11/11/2012   Skin lesion of scalp 05/09/2013   Acute bacterial sinusitis 10/24/2013   Cough 10/24/2013   Puncture wound of finger 10/28/2017   Acute sinusitis 08/30/2018   Elevated WBC count 12/22/2019   Past Medical History:  Diagnosis Date   Arthritis    Bifascicular block    Coronary artery disease    Gastritis    GERD (gastroesophageal reflux disease)    History of bladder cancer    History of nephrolithiasis    Hypertension    Partial tear of rotator cuff(726.13)    Psoriasis     SOCIAL HX:  Social History   Tobacco Use   Smoking status: Former    Packs/day: 1.50    Years: 50.00    Pack years: 75.00    Types: Cigarettes    Quit date: 12/05/1999    Years since quitting: 21.6   Smokeless tobacco: Never  Substance Use Topics   Alcohol use: No    Alcohol/week: 0.0 standard drinks     FAMILY HX:  Family History  Problem Relation Age of Onset   Transient ischemic attack Mother    Coronary artery disease Mother    Heart disease Mother        CAD   Alcohol abuse Father  Diabetes type II Father    Diabetes Father    Heart disease Father        pacemaker      ALLERGIES:  Allergies  Allergen Reactions   Drug Class [Statins] Other (See Comments)    cramps      PERTINENT MEDICATIONS:  Outpatient Encounter Medications as of 07/17/2021  Medication Sig   albuterol (VENTOLIN HFA) 108 (90 Base) MCG/ACT inhaler Inhale 2 puffs into the lungs every 6 (six) hours as needed for wheezing or shortness of breath. (Patient not  taking: Reported on 07/02/2021)   amLODipine (NORVASC) 5 MG tablet Take 1 tablet (5 mg total) by mouth daily.   amLODipine (NORVASC) 5 MG tablet Take 1 tablet by mouth daily.   b complex vitamins capsule Take 1 capsule by mouth daily.   carvedilol (COREG) 6.25 MG tablet Take by mouth.   guaiFENesin (MUCINEX) 600 MG 12 hr tablet Take 1 tablet (600 mg total) by mouth 2 (two) times daily.   hydrochlorothiazide (HYDRODIURIL) 25 MG tablet Take 1 tablet (25 mg total) by mouth daily.   latanoprost (XALATAN) 0.005 % ophthalmic solution Place 1 drop into both eyes at bedtime.   Magnesium Oxide 420 MG TABS Take by mouth.   phenazopyridine (PYRIDIUM) 200 MG tablet Take 1 tablet (200 mg total) by mouth 3 (three) times daily.   Tiotropium Bromide Monohydrate (SPIRIVA RESPIMAT) 2.5 MCG/ACT AERS Inhale 2 puffs into the lungs daily. (Patient not taking: Reported on 07/02/2021)   No facility-administered encounter medications on file as of 07/17/2021.     Thank you for the opportunity to participate in the care of Mr. Casher.  The palliative care team will continue to follow. Please call our office at [phone] if we can be of additional assistance.   Note: Portions of this note were generated with Lobbyist. Dictation errors may occur despite best attempts at proofreading.  Teodoro Spray, NP

## 2021-08-01 ENCOUNTER — Telehealth: Payer: Self-pay

## 2021-08-01 ENCOUNTER — Other Ambulatory Visit: Payer: Medicare Other | Admitting: *Deleted

## 2021-08-01 ENCOUNTER — Other Ambulatory Visit: Payer: Self-pay

## 2021-08-01 ENCOUNTER — Telehealth: Payer: Self-pay | Admitting: Family Medicine

## 2021-08-01 DIAGNOSIS — C679 Malignant neoplasm of bladder, unspecified: Secondary | ICD-10-CM

## 2021-08-01 DIAGNOSIS — Z515 Encounter for palliative care: Secondary | ICD-10-CM

## 2021-08-01 NOTE — Addendum Note (Signed)
Addended by: Loura Pardon A on: 08/01/2021 02:22 PM   Modules accepted: Orders

## 2021-08-01 NOTE — Progress Notes (Signed)
[facility] RN NOTE  PATIENT NAME: Joel [name] DOB: [birth year] MRN: [id number]  PRIMARY CARE PROVIDER: Abner Greenspan, MD  RESPONSIBLE PARTY: Joel [name] (daughter) Acct ID - Guarantor Home Phone Work Phone Relationship Acct Type  [id number] ADOLPHE, [phone]  Self P/F     [address], [address], [place] [id number]   Due to the COVID-19 crisis, this virtual check-in visit was done via telephone from my office and it was initiated and consent by this patient and or family.  RN telephonic visit encounter completed with patient's daughter [name]. She reports that patient has been having ongoing issues with nausea and dry heaving. He has a diagnosis of bladder cancer. He is experiencing escalating severe pain in his stomach and testicles. He only has Tylenol to take for this which is ineffective. He is much weaker and unable to get up off of the couch in the living room and is having to use a urinal. He is barely eating. Daughter is able to get him to drink a little something at times. He was evaluated by hospice a few months ago but he was not ready at the time. Kathy's main goal for him is to have something for pain and nausea and is made/kept comfortable and not suffering. Education provided regarding hospice services. Daughter is agreeable to a hospice consult for patient. I discussed patient with her palliative NP, Laverda Sorenson who agrees that patient is eligible. I also discussed this case with our hospice medical director, Dr. Konrad Dolores who is also in agreement with hospice. Palliative RN-Julie reached out to Dr. Glori Bickers and received an order for a hospice consult and she agrees to be the attending while patient is under hospice care. This information was sent to our [facility] referral center and they will contact daughter to schedule date/time of hospice admission visit. Daughter is Patent attorney.   (Duration of visit and documentation 45  minutes)    Daryl Eastern, RN BSN

## 2021-08-01 NOTE — Telephone Encounter (Signed)
Joel [name] notified PCP is okay being attending provider and we are working on referral. They said they are going to eval pt asap because he has had a significant decline

## 2021-08-01 NOTE — Telephone Encounter (Signed)
The referral is in  I can be attending Thanks

## 2021-08-01 NOTE — Telephone Encounter (Signed)
1250 pm.  Incoming call from [facility], Palliative Care RN.  Patient has been seen today and has declined significantly.  Patient was evaluated for hospice and approval received from Dr. Cleon Gustin.  RN is requesting order from listed PCP.  Phone call made to PCP office to request order for hospice referral and for PCP to serve as AOR while under hospice care.  Response pending.

## 2021-08-01 NOTE — Telephone Encounter (Signed)
Mrs. Joel [name] called in from  [facility] wanted to know if they can get a referral for hospice care and for Dr. Glori Bickers to be the attending physician for his care

## 2021-08-11 ENCOUNTER — Telehealth: Payer: Self-pay

## 2021-08-11 NOTE — Telephone Encounter (Signed)
Do reach out to the hospice nurse, I don't think there is much that can be done about poor oral intake but there may be something to help the dry mouth     Small sips of fluid can help mouth.  There are also some things they will often use for hospitalized patients (a sponge on a stick that can be wetted for oral comfort) that hospice may have  Thanks for letting us know and please keep [place] posted

## 2021-08-11 NOTE — Telephone Encounter (Signed)
Pt's daughter [name] said she was able to get in touch with Hospice nurse and they have helped and answered all of her concerns nothing further needed for PCP

## 2021-08-11 NOTE — Telephone Encounter (Signed)
I spoke with [name] [facility] signed)Kathy said for few days pt has been taking in sips of water and last night ate few green beans. Has not eaten anything today and has had few sips of water today. Pt has CA and has had abd pain for 6 months and dizziness for 2 yrs. [name] said those 2 symptoms are no worse than usual. Pt's mouth is very dry and mouth is sticking together. Pt feels like throat is closing up but [name] said she thinks it is more that throat is so dry rather than throat is swelling. Pt is presently on hospice and taking liquid morphine. [name] said pt may need to go to ED but pt will not go to ED due to the last time pt was at ED he waited 15 hrs to be seen. [name] has not called hospice nurse yet. I advised pt to call hospice nurse and I will send note to DR Glori Bickers and [place] CMA also and teams [place]. ED precautions were given but also advised to go ahead and call hospice.

## 2021-08-11 NOTE — Telephone Encounter (Signed)
[facility] - Client TELEPHONE ADVICE RECORD [facility] Patient Name: Joel CRESCENZO [name] Gender: Male DOB: August 28, 1935 Age: 86 Y 43 M 18 D Return Phone Number: [phone] (Primary) Address[place]  [address] Client [facility] - Client Client Site [place] Provider Glori Bickers, Roque Lias - MD Contact Type Call Who Is Calling Patient / Member / Family / Caregiver Call Type Triage / Clinical Caller Name Kathe Mariner Relationship To Patient Daughter Return Phone Number [phone] (Primary) Chief Complaint Swallowing Difficulty Reason for Call Symptomatic / Request for [place] states her father hasn't been able to eat or drink, having difficulty swallowing and states his mouth is sticky. States he has stage 4 bladder cancer. Translation No No Triage Reason Other Nurse Assessment Nurse: Susy Manor, RN, Megan Date/Time (Eastern Time): 08/11/2021 9:43:08 AM Confirm and document reason for call. If symptomatic, describe symptoms. ---Caller states she is not with pt, and I'm not able to speak with him or anyone else that might with him right now. Does the patient have any new or worsening symptoms? ---Yes Will a triage be completed? ---No Select reason for no triage. ---Other Please document clinical information provided and list any resource used. ---I advised we can't triage without speaking with pt or someone with patient. She verbalized understanding. Caller will call back when she is with him. Disp. Time Eilene Ghazi Time) Disposition Final User 08/11/2021 9:44:54 AM Clinical Call Yes Susy Manor, RN, [place]

## 2021-08-21 ENCOUNTER — Telehealth: Payer: Self-pay | Admitting: Family Medicine

## 2021-08-21 NOTE — Telephone Encounter (Signed)
Please give verbal ok.   I assume they mean Haldol? Do they need me to send it in or just approval for them to?

## 2021-08-21 NOTE — Telephone Encounter (Signed)
[facility] nurse [phone] (secure line)  Need approval to call in halvol  5 mg per protocol Every 4 hours as needed for anxiety and agitation

## 2021-08-22 MED ORDER — GUAIFENESIN ER 600 MG PO TB12: 600.0000 mg | ORAL_TABLET | Freq: Two times a day (BID) | ORAL | 3 refills | Status: AC

## 2021-08-22 NOTE — Telephone Encounter (Signed)
Rx refilled for pt. Nothing further needed.

## 2021-08-22 NOTE — Telephone Encounter (Signed)
VO given to [facility], they just needed a VO and she will take care of the Rx getting sent to pharmacy

## 2021-08-28 ENCOUNTER — Telehealth: Payer: Self-pay | Admitting: Family Medicine

## 2021-08-28 NOTE — Telephone Encounter (Signed)
Left VM giving the VO °

## 2021-08-28 NOTE — Telephone Encounter (Signed)
Hospice called the need verbal order to increase the morphine 1mg  every 2hours for pain since he is at end of life  Please call [phone]

## 2021-08-28 NOTE — Telephone Encounter (Signed)
Please ok that order Let me know if anything needs to be sent in

## 2021-09-03 DEATH — deceased

## 2021-09-24 ENCOUNTER — Telehealth: Payer: Self-pay | Admitting: Emergency Medicine

## 2021-09-24 NOTE — Telephone Encounter (Signed)
Pt's OV has been printed to be faxed in Michelle's attn at the provided fax number. Nothing further needed. ?

## 2021-10-04 DEATH — deceased
